# Patient Record
Sex: Female | Born: 1950 | Race: Black or African American | Hispanic: No | Marital: Single | Smoking: Never smoker
Health system: Southern US, Community
[De-identification: ages and names within clinical notes are randomized; demographics above are authoritative.]

## PROBLEM LIST (undated history)

## (undated) DIAGNOSIS — K5909 Other constipation: Secondary | ICD-10-CM

## (undated) DIAGNOSIS — I10 Essential (primary) hypertension: Secondary | ICD-10-CM

## (undated) DIAGNOSIS — I639 Cerebral infarction, unspecified: Secondary | ICD-10-CM

## (undated) HISTORY — PX: NO PAST SURGERIES: SHX2092

---

## 2009-11-29 ENCOUNTER — Inpatient Hospital Stay (HOSPITAL_COMMUNITY): Admission: EM | Admit: 2009-11-29 | Discharge: 2009-12-01 | Payer: Self-pay | Admitting: Emergency Medicine

## 2011-03-12 LAB — BASIC METABOLIC PANEL
BUN: 23 mg/dL (ref 6–23)
CO2: 24 mEq/L (ref 19–32)
Calcium: 8 mg/dL — ABNORMAL LOW (ref 8.4–10.5)
Calcium: 8.4 mg/dL (ref 8.4–10.5)
Calcium: 9.5 mg/dL (ref 8.4–10.5)
Calcium: 9.7 mg/dL (ref 8.4–10.5)
Chloride: 97 mEq/L (ref 96–112)
Creatinine, Ser: 0.75 mg/dL (ref 0.4–1.2)
Creatinine, Ser: 0.83 mg/dL (ref 0.4–1.2)
Creatinine, Ser: 1.75 mg/dL — ABNORMAL HIGH (ref 0.4–1.2)
GFR calc Af Amer: 49 mL/min — ABNORMAL LOW (ref >60)
GFR calc Af Amer: >60 mL/min (ref >60)
GFR calc Af Amer: >60 mL/min (ref >60)
GFR calc non Af Amer: 30 mL/min — ABNORMAL LOW (ref >60)
GFR calc non Af Amer: 38 mL/min — ABNORMAL LOW (ref >60)
GFR calc non Af Amer: 41 mL/min — ABNORMAL LOW (ref >60)
Glucose, Bld: 186 mg/dL — ABNORMAL HIGH (ref 70–99)
Glucose, Bld: 224 mg/dL — ABNORMAL HIGH (ref 70–99)
Potassium: 3.8 mEq/L (ref 3.5–5.1)
Potassium: 4.1 mEq/L (ref 3.5–5.1)
Sodium: 133 mEq/L — ABNORMAL LOW (ref 135–145)
Sodium: 133 mEq/L — ABNORMAL LOW (ref 135–145)

## 2011-03-12 LAB — GLUCOSE, CAPILLARY
Glucose-Capillary: 137 mg/dL — ABNORMAL HIGH (ref 70–99)
Glucose-Capillary: 168 mg/dL — ABNORMAL HIGH (ref 70–99)
Glucose-Capillary: 204 mg/dL — ABNORMAL HIGH (ref 70–99)
Glucose-Capillary: 293 mg/dL — ABNORMAL HIGH (ref 70–99)
Glucose-Capillary: >600 mg/dL (ref 70–99)
Glucose-Capillary: >600 mg/dL (ref 70–99)
Glucose-Capillary: >600 mg/dL (ref 70–99)

## 2011-03-12 LAB — BASIC METABOLIC PANEL WITH GFR
BUN: 23 mg/dL (ref 6–23)
CO2: 16 meq/L — ABNORMAL LOW (ref 19–32)
Calcium: 9.7 mg/dL (ref 8.4–10.5)
Chloride: 91 meq/L — ABNORMAL LOW (ref 96–112)
Creatinine, Ser: 1.64 mg/dL — ABNORMAL HIGH (ref 0.4–1.2)
GFR calc non Af Amer: 32 mL/min — ABNORMAL LOW
Glucose, Bld: 937 mg/dL (ref 70–99)
Potassium: 5.1 meq/L (ref 3.5–5.1)
Sodium: 129 meq/L — ABNORMAL LOW (ref 135–145)

## 2011-03-12 LAB — BLOOD GAS, VENOUS
Bicarbonate: 15.2 mEq/L — ABNORMAL LOW (ref 20.0–24.0)
Patient temperature: 98.6
TCO2: 13.8 mmol/L (ref 0–100)
pCO2, Ven: 30.8 mmHg — ABNORMAL LOW (ref 45.0–50.0)
pH, Ven: 7.315 — ABNORMAL HIGH (ref 7.250–7.300)

## 2011-03-12 LAB — POCT I-STAT, CHEM 8
BUN: 22 mg/dL (ref 6–23)
Calcium, Ion: 1.2 mmol/L (ref 1.12–1.32)
Chloride: 96 mEq/L (ref 96–112)
HCT: 47 % — ABNORMAL HIGH (ref 36.0–46.0)
Potassium: 4.7 mEq/L (ref 3.5–5.1)
Sodium: 128 mEq/L — ABNORMAL LOW (ref 135–145)

## 2011-03-12 LAB — URINE MICROSCOPIC-ADD ON

## 2011-03-12 LAB — POCT CARDIAC MARKERS
Myoglobin, poc: 299 ng/mL (ref 12–200)
Troponin i, poc: <0.05 ng/mL (ref 0.00–0.09)

## 2011-03-12 LAB — DIFFERENTIAL
Basophils Relative: 0 % (ref 0–1)
Eosinophils Absolute: 0 10*3/uL (ref 0.0–0.7)
Eosinophils Relative: 0 % (ref 0–5)
Lymphocytes Relative: 38 % (ref 12–46)
Monocytes Absolute: 1 10*3/uL (ref 0.1–1.0)
Neutro Abs: 10.8 10*3/uL — ABNORMAL HIGH (ref 1.7–7.7)

## 2011-03-12 LAB — CK TOTAL AND CKMB (NOT AT ARMC)
CK, MB: 1.2 ng/mL (ref 0.3–4.0)
Relative Index: 1 (ref 0.0–2.5)
Total CK: 124 U/L (ref 7–177)

## 2011-03-12 LAB — CBC
Hemoglobin: 13.2 g/dL (ref 12.0–15.0)
MCHC: 32.2 g/dL (ref 30.0–36.0)
MCV: 86.4 fL (ref 78.0–100.0)
RBC: 4.73 MIL/uL (ref 3.87–5.11)
WBC: 19.1 10*3/uL — ABNORMAL HIGH (ref 4.0–10.5)

## 2011-03-12 LAB — LIPID PANEL
HDL: 35 mg/dL — ABNORMAL LOW (ref >39)
Total CHOL/HDL Ratio: 3.8 RATIO
Triglycerides: 69 mg/dL (ref <150)
VLDL: 14 mg/dL (ref 0–40)

## 2011-03-12 LAB — RAPID URINE DRUG SCREEN, HOSP PERFORMED
Cocaine: NOT DETECTED
Tetrahydrocannabinol: NOT DETECTED

## 2011-03-12 LAB — HEPATIC FUNCTION PANEL
ALT: 31 U/L (ref 0–35)
Albumin: 4.1 g/dL (ref 3.5–5.2)
Alkaline Phosphatase: 121 U/L — ABNORMAL HIGH (ref 39–117)
Total Bilirubin: 1.1 mg/dL (ref 0.3–1.2)

## 2011-03-12 LAB — URINALYSIS, ROUTINE W REFLEX MICROSCOPIC
Bilirubin Urine: NEGATIVE
Nitrite: NEGATIVE
Specific Gravity, Urine: 1.028 (ref 1.005–1.030)
Urobilinogen, UA: 0.2 mg/dL (ref 0.0–1.0)
pH: 5.5 (ref 5.0–8.0)

## 2011-03-12 LAB — URINE CULTURE

## 2011-03-12 LAB — HEMOGLOBIN A1C
Hgb A1c MFr Bld: 12.3 % — ABNORMAL HIGH (ref 4.6–6.1)
Mean Plasma Glucose: 306 mg/dL

## 2011-03-12 LAB — PATHOLOGIST SMEAR REVIEW

## 2011-03-12 LAB — CARDIAC PANEL(CRET KIN+CKTOT+MB+TROPI)
CK, MB: 2.9 ng/mL (ref 0.3–4.0)
Relative Index: 1.1 (ref 0.0–2.5)
Total CK: 296 U/L — ABNORMAL HIGH (ref 7–177)

## 2011-03-12 LAB — TSH: TSH: 0.713 u[IU]/mL (ref 0.350–4.500)

## 2011-03-12 LAB — KETONES, QUALITATIVE

## 2011-03-12 LAB — APTT: aPTT: 23 seconds — ABNORMAL LOW (ref 24–37)

## 2011-03-12 LAB — SALICYLATE LEVEL: Salicylate Lvl: <4 mg/dL (ref 2.8–20.0)

## 2011-04-07 ENCOUNTER — Emergency Department (HOSPITAL_COMMUNITY): Payer: Medicare Other

## 2011-04-07 ENCOUNTER — Inpatient Hospital Stay (HOSPITAL_COMMUNITY)
Admission: EM | Admit: 2011-04-07 | Discharge: 2011-04-12 | DRG: 389 | Disposition: A | Discharge status: Home / Self Care | Payer: Medicare Other | Attending: Family Medicine | Admitting: Family Medicine

## 2011-04-07 DIAGNOSIS — K560 Paralytic ileus: Principal | ICD-10-CM | POA: Diagnosis present

## 2011-04-07 DIAGNOSIS — F72 Severe intellectual disabilities: Secondary | ICD-10-CM | POA: Diagnosis present

## 2011-04-07 DIAGNOSIS — I10 Essential (primary) hypertension: Secondary | ICD-10-CM | POA: Diagnosis present

## 2011-04-07 DIAGNOSIS — Z7982 Long term (current) use of aspirin: Secondary | ICD-10-CM

## 2011-04-07 DIAGNOSIS — Z8673 Personal history of transient ischemic attack (TIA), and cerebral infarction without residual deficits: Secondary | ICD-10-CM

## 2011-04-07 DIAGNOSIS — K5641 Fecal impaction: Secondary | ICD-10-CM | POA: Diagnosis present

## 2011-04-07 DIAGNOSIS — E876 Hypokalemia: Secondary | ICD-10-CM | POA: Diagnosis present

## 2011-04-07 DIAGNOSIS — E119 Type 2 diabetes mellitus without complications: Secondary | ICD-10-CM | POA: Diagnosis present

## 2011-04-07 DIAGNOSIS — K5939 Other megacolon: Secondary | ICD-10-CM | POA: Diagnosis present

## 2011-04-07 LAB — DIFFERENTIAL
Basophils Absolute: 0.1 K/uL (ref 0.0–0.1)
Basophils Relative: 0 % (ref 0–1)
Eosinophils Absolute: 0.1 K/uL (ref 0.0–0.7)
Eosinophils Relative: 0 % (ref 0–5)
Lymphocytes Relative: 23 % (ref 12–46)
Lymphs Abs: 2.9 K/uL (ref 0.7–4.0)
Monocytes Absolute: 0.8 10*3/uL (ref 0.1–1.0)
Monocytes Relative: 6 % (ref 3–12)
Neutro Abs: 8.6 10*3/uL — ABNORMAL HIGH (ref 1.7–7.7)
Neutrophils Relative %: 70 % (ref 43–77)

## 2011-04-07 LAB — URINALYSIS, ROUTINE W REFLEX MICROSCOPIC
Bilirubin Urine: NEGATIVE
Glucose, UA: NEGATIVE mg/dL
Hgb urine dipstick: NEGATIVE
Ketones, ur: 40 mg/dL — AB
Nitrite: NEGATIVE
Protein, ur: NEGATIVE mg/dL
Specific Gravity, Urine: 1.024 (ref 1.005–1.030)
Urobilinogen, UA: 0.2 mg/dL (ref 0.0–1.0)
pH: 6 (ref 5.0–8.0)

## 2011-04-07 LAB — COMPREHENSIVE METABOLIC PANEL
ALT: 28 U/L (ref 0–35)
AST: 26 U/L (ref 0–37)
Alkaline Phosphatase: 77 U/L (ref 39–117)
CO2: 27 mEq/L (ref 19–32)
Calcium: 9.8 mg/dL (ref 8.4–10.5)
Chloride: 100 mEq/L (ref 96–112)
GFR calc Af Amer: >60 mL/min (ref >60)
GFR calc non Af Amer: >60 mL/min (ref >60)
Potassium: 2.6 mEq/L — CL (ref 3.5–5.1)
Sodium: 139 mEq/L (ref 135–145)

## 2011-04-07 LAB — COMPREHENSIVE METABOLIC PANEL WITH GFR
Albumin: 4 g/dL (ref 3.5–5.2)
BUN: 11 mg/dL (ref 6–23)
Creatinine, Ser: 0.84 mg/dL (ref 0.4–1.2)
Glucose, Bld: 125 mg/dL — ABNORMAL HIGH (ref 70–99)
Total Bilirubin: 0.9 mg/dL (ref 0.3–1.2)
Total Protein: 8 g/dL (ref 6.0–8.3)

## 2011-04-07 LAB — CBC
HCT: 42.5 % (ref 36.0–46.0)
Hemoglobin: 14.1 g/dL (ref 12.0–15.0)
MCH: 27.6 pg (ref 26.0–34.0)
MCHC: 33.2 g/dL (ref 30.0–36.0)
MCV: 83.2 fL (ref 78.0–100.0)
Platelets: 415 K/uL — ABNORMAL HIGH (ref 150–400)
RBC: 5.11 MIL/uL (ref 3.87–5.11)
RDW: 14 % (ref 11.5–15.5)
WBC: 12.3 K/uL — ABNORMAL HIGH (ref 4.0–10.5)

## 2011-04-07 LAB — LIPASE, BLOOD: Lipase: 23 U/L (ref 11–59)

## 2011-04-07 LAB — URINE MICROSCOPIC-ADD ON

## 2011-04-07 MED ORDER — IOHEXOL 300 MG/ML  SOLN
125.0000 mL | Freq: Once | INTRAMUSCULAR | Status: AC | PRN
  Administered 2011-04-07: 125 mL via INTRAVENOUS

## 2011-04-08 LAB — BASIC METABOLIC PANEL
CO2: 26 mEq/L (ref 19–32)
Chloride: 102 mEq/L (ref 96–112)
Creatinine, Ser: 0.71 mg/dL (ref 0.4–1.2)
GFR calc Af Amer: >60 mL/min (ref >60)

## 2011-04-08 LAB — HEMOGLOBIN A1C: Hgb A1c MFr Bld: 6.7 % — ABNORMAL HIGH (ref <5.7)

## 2011-04-08 LAB — CBC
HCT: 37 % (ref 36.0–46.0)
MCV: 83.7 fL (ref 78.0–100.0)
RBC: 4.42 MIL/uL (ref 3.87–5.11)
RDW: 14.2 % (ref 11.5–15.5)
WBC: 10.6 10*3/uL — ABNORMAL HIGH (ref 4.0–10.5)

## 2011-04-08 LAB — GLUCOSE, CAPILLARY: Glucose-Capillary: 122 mg/dL — ABNORMAL HIGH (ref 70–99)

## 2011-04-09 DIAGNOSIS — R933 Abnormal findings on diagnostic imaging of other parts of digestive tract: Secondary | ICD-10-CM

## 2011-04-09 DIAGNOSIS — K5900 Constipation, unspecified: Secondary | ICD-10-CM

## 2011-04-09 DIAGNOSIS — K598 Other specified functional intestinal disorders: Secondary | ICD-10-CM

## 2011-04-09 LAB — COMPREHENSIVE METABOLIC PANEL
ALT: 24 U/L (ref 0–35)
Albumin: 3.2 g/dL — ABNORMAL LOW (ref 3.5–5.2)
Alkaline Phosphatase: 66 U/L (ref 39–117)
GFR calc Af Amer: >60 mL/min (ref >60)
Potassium: 3.3 mEq/L — ABNORMAL LOW (ref 3.5–5.1)
Sodium: 140 mEq/L (ref 135–145)
Total Protein: 6.5 g/dL (ref 6.0–8.3)

## 2011-04-09 LAB — GLUCOSE, CAPILLARY
Glucose-Capillary: 154 mg/dL — ABNORMAL HIGH (ref 70–99)
Glucose-Capillary: 163 mg/dL — ABNORMAL HIGH (ref 70–99)

## 2011-04-10 ENCOUNTER — Inpatient Hospital Stay (HOSPITAL_COMMUNITY): Payer: Medicare Other

## 2011-04-10 DIAGNOSIS — R933 Abnormal findings on diagnostic imaging of other parts of digestive tract: Secondary | ICD-10-CM

## 2011-04-10 DIAGNOSIS — K598 Other specified functional intestinal disorders: Secondary | ICD-10-CM

## 2011-04-10 LAB — BASIC METABOLIC PANEL
BUN: 3 mg/dL — ABNORMAL LOW (ref 6–23)
Calcium: 9 mg/dL (ref 8.4–10.5)
GFR calc non Af Amer: >60 mL/min (ref >60)
Glucose, Bld: 101 mg/dL — ABNORMAL HIGH (ref 70–99)
Potassium: 3.5 mEq/L (ref 3.5–5.1)

## 2011-04-10 LAB — GLUCOSE, CAPILLARY
Glucose-Capillary: 109 mg/dL — ABNORMAL HIGH (ref 70–99)
Glucose-Capillary: 138 mg/dL — ABNORMAL HIGH (ref 70–99)
Glucose-Capillary: 169 mg/dL — ABNORMAL HIGH (ref 70–99)
Glucose-Capillary: 95 mg/dL (ref 70–99)

## 2011-04-10 LAB — CBC
Hemoglobin: 13.1 g/dL (ref 12.0–15.0)
MCH: 27.4 pg (ref 26.0–34.0)
MCHC: 33.2 g/dL (ref 30.0–36.0)

## 2011-04-11 LAB — BASIC METABOLIC PANEL
CO2: 23 mEq/L (ref 19–32)
Chloride: 107 mEq/L (ref 96–112)
GFR calc Af Amer: >60 mL/min (ref >60)
Potassium: 4.3 mEq/L (ref 3.5–5.1)
Sodium: 137 mEq/L (ref 135–145)

## 2011-04-11 LAB — GLUCOSE, CAPILLARY
Glucose-Capillary: 137 mg/dL — ABNORMAL HIGH (ref 70–99)
Glucose-Capillary: 115 mg/dL — ABNORMAL HIGH (ref 70–99)
Glucose-Capillary: 99 mg/dL (ref 70–99)

## 2011-04-11 LAB — CBC
Hemoglobin: 12.1 g/dL (ref 12.0–15.0)
MCH: 26.7 pg (ref 26.0–34.0)
RBC: 4.54 MIL/uL (ref 3.87–5.11)

## 2011-04-12 LAB — BASIC METABOLIC PANEL
BUN: 3 mg/dL — ABNORMAL LOW (ref 6–23)
Creatinine, Ser: 0.51 mg/dL (ref 0.4–1.2)
GFR calc non Af Amer: >60 mL/min (ref >60)

## 2011-04-12 LAB — GLUCOSE, CAPILLARY: Glucose-Capillary: 105 mg/dL — ABNORMAL HIGH (ref 70–99)

## 2011-04-21 NOTE — H&P (Signed)
[name], [name]             ACCOUNT NO.:  [id number]  MEDICAL RECORD NO.:  [id number]           PATIENT TYPE:  I  LOCATION:  1445                         FACILITY:  [facility]  PHYSICIAN:  Della Goo, M.D. DATE OF BIRTH:  May 17, 1951  DATE OF ADMISSION:  04/07/2011 DATE OF DISCHARGE:                             HISTORY & PHYSICAL   PRIMARY CARE PHYSICIAN:  Dr. Knox Royalty.  CHIEF COMPLAINT:  Nausea and vomiting, constipation, and abdominal bloating.  HISTORY OF PRESENT ILLNESS:  This is a 60 year old female with a history of severe mental retardation with a capacity of a 14-year-old who was brought to the emergency department by her sister secondary to continued nausea and vomiting over the past 5 days as well as constipation for the past 5 days.  The patient has also had associated abdominal distention and abdominal pain.  The patient's sister denies that she has had any hematemesis.  She has not had any fever or chills.  The patient has not had any complaints of chest pain or shortness of breath.  The patient was evaluated in the emergency department and was found to have severe abdominal distention.  An NG tube was placed and imaging studies were performed, which did reveal a gaseous distention of the small and large bowel presenting an ileus, however, an obstruction was not excluded, no free air was seen.  A CT scan of the abdomen and pelvis was performed to clarify whether there was an obstruction and the results were consistent with a severe diffuse colonic ileus with a mass involving the posterior wall of the distal rectum above the anal verge, which could be a fecal impaction.  Normal caliber small bowel seen throughout without evidence of bowel obstruction.  The stomach was decompressed by the NG tube and diffuse hepatic steatosis was seen with focal areas of fatty sparing.  The patient's sister reports that [name] had a hospitalization for similar symptoms  in the past and a rectal tube had to be placed for decompression.  PAST MEDICAL HISTORY: 1. Hypertension. 2. Type 2 diabetes mellitus. 3. Mental retardation. 4. The patient also had a cerebrovascular accident as a baby at age 44.  PAST SURGICAL HISTORY:  None.  MEDICATIONS:  Lisinopril, Coreg, amlodipine, and Lantus insulin.  ALLERGIES:  No known drug allergies.  SOCIAL HISTORY:  The patient lives with her family.  She is a nonsmoker, nondrinker.  No history of illicit drug usage and family history is noncontributory.  REVIEW OF SYSTEMS:  Pertinent as mentioned above.  PHYSICAL EXAMINATION FINDINGS:  GENERAL:  This is a 60 year old, small, morbidly obese African American female who is in discomfort, but in no acute distress. VITAL SIGNS:  Temperature 99.0, blood pressure 156/90, heart rate 110, respirations 18, O2 saturations 96%.  HEENT:  Normocephalic, atraumatic. Pupils equally round, reactive to light.  Extraocular movements are intact.  Funduscopic benign.  Nares, there is a nasogastric tube present in the right naris, both nares are patent otherwise.  Oropharynx is clear. NECK:  Supple.  Full range of motion.  No thyromegaly, adenopathy, or jugular venous distention. CARDIOVASCULAR:  Regular rate and rhythm with mild  tachycardia.  No murmurs, gallops, or rubs appreciated.  LUNGS:  Clear to auscultation bilaterally.  No rales, rhonchi, or wheezes. ABDOMEN:  Hypoactive bowel sounds, firm, distended, tympanic in all 4 quadrants and mildly tender diffusely.  There is no rebound, no guarding. EXTREMITIES:  Without cyanosis, clubbing, or edema. NEUROLOGIC:  Nonfocal.  LABORATORY STUDIES:  White blood cell count 12.3, hemoglobin 14.1, hematocrit 42.5, MCV 83.2, platelets 115, neutrophils 70% lymphocytes 23%.  Urinalysis negative except for small leukocyte esterase.  Sodium 139, potassium 2.6, chloride 100, CO2 of 27, BUN 11, creatinine 0.84, glucose 125, lipase  23.  IMAGING STUDIES:  As mentioned above in HPI.  ASSESSMENT:  A 60 year old female being admitted with, 1. Colonic ileus. 2. Abdominal distention. 3. Abdominal pain. 4. Nausea and vomiting. 5. Hypokalemia. 6. Hypertension. 7. Type 2 diabetes mellitus. 8. Mental retardation. 9. Morbid obesity.  PLAN:  The patient will be admitted, an NG tube had been placed by the emergency department physician.  This has minimal draining residue that is nonsanguineous.  A rectal tube has been placed for decompression and this has helped.  A large amount of colonic air has been relieved.  The patient will be placed on clear liquids at this time and antiemetic therapy will also be ordered as needed along with laxative therapy.  IV Reglan will be ordered to promote transit.  Potassium replacement therapy will also be ordered IV and a magnesium level will also be ordered and repleted if needed.  The patient will be placed on DVT prophylaxis and IV Protonix therapy will also be ordered.  Code status is a full code at this time.     Della Goo, M.D.     HJ/MEDQ  D:  04/08/2011  T:  04/08/2011  Job:  [id number]  cc:   Dr. Knox Royalty  Electronically Signed by Della Goo M.D. on 04/21/2011 07:48:37 PM

## 2011-04-23 NOTE — Discharge Summary (Signed)
[name], [name]             ACCOUNT NO.:  [id number]  MEDICAL RECORD NO.:  [id number]           PATIENT TYPE:  I  LOCATION:  1445                         [facility]ACILITY:  [facility]  PHYSICIAN:  [name], M.D.DATE OF BIRTH:  [birth year]  DATE OF ADMISSION:  04/07/2011 DATE OF DISCHARGE:  04/12/2011                        DISCHARGE SUMMARY - REFERRING   DISCHARGE DIAGNOSES: 1. Severe diffuse chronic ileus/chronic obstipation. 2. Hypokalemia - resolved. 3. Diabetes mellitus. 4. Hypertension. 5. Mental retardation. 6. History of cerebrovascular accident at age 16.  PROCEDURES AND STUDIES: 1. Abdominal x-rays on April 28 - gaseous distention of small and     large bowel in the pattern most likely to represent ileus.  Distal     colonic obstruction is not excluded.  No free air. 2. CT scan of the abdomen and pelvis on April 28 - findings consistent     with severe diffuse colonic ileus, though there may be a mass     involving the posterior wall of the distal rectum just above the     anal verge.  Please correlate the digital rectal exam.  Normal     caliber small bowel throughout without evidence of bowel     obstruction.  Stomach decompressed by nasogastric tube.  Diffuse     hepatic steatosis with focal areas of fatty sparing. 3. Followup abdominal x-ray on May 1 - no significant change in the     diffuse colonic dilatation to the level of the sigmoid. 4. Colonoscopy to the descending colon - on April 09, 2011, by Dr.     Lina [name].  The area of the colon examined was normal in     appearance, dilated, tortuous, hypotonic, full of liquid stool.  No     obstructing lesion, unable to reach the cecum.  Mid ascending colon     by transillumination, the rectum and sigmoid appeared normal.     Retroflexed views in the rectum revealed no abnormalities.  The     scope was then withdrawn from the patient and the procedure     terminated.  CONSULTATIONS:  Gastroenterology - Dr.  Lina [name]  BRIEF HISTORY:  The patient is a 60 year old black female with the above- listed medical problems including severe mental retardation with the capacity of a 65-year-old who was brought to the ED by her sister secondary to nausea and vomiting of 5 days as well as constipation for the same duration.  The patient also had associated abdominal distention and pain.  Her sister denied any hematemesis.  She did not report any fevers or chills and the patient has not had chest pain or shortness of breath.  She was seen in the ED and was found to have severe abdominal distention and an NG tube was placed.  Imaging studies were done and the results as stated above.  She was admitted for further evaluation and management.  HOSPITAL COURSE: 1. Severe colonic ileus/chronic obstipation with fecal impaction - as     discussed above, the patient had imaging studies on admission and     the results as stated above, an NG tube was placed  in the ED     department and the admitting physician noted that there was only     minimal draining residual that was nonbloody.  The rectal tube was     then placed for decompression and this helped.  A large amount of     colonic air was removed.  The patient began improving and was     placed on a clear liquid diet and was also treated symptomatically     with antiemetics.  She was also placed on the bowel regimen with     MiraLax and Dulcolax and Gastroenterology was consulted.  Dr. Lina [name] saw the patient and it was noted that the CT scan indicated     that a rectal mass could not be excluded, and so a colonoscopy was     done on April 09, 2011, to the ascending colon and showed a normal     colon, normal rectosigmoid, hypotonic mildly dilated tortuous colon     consistent with colonic inertia, no anatomic obstruction, fecal     pouch not reached.  The rectal tube was removed and the patient was     maintained on the bowel regimen.  She has had  about 2 to 3 large     bowel movements a day on this regimen and she is not having any     abdominal pain and her abdomen is much less distended.  She is     tolerating p.o.'s well with no nausea or vomiting.  She is     clinically improved at this time and I discussed her care with Dr.     Juanda Chance today and she recommends discharging her on MiraLax b.i.d.     with Dulcolax daily as well sometime at mid day.  She is to follow     up outpatient with her primary care physician as well as with Dr.     Juanda Chance. 2. Diabetes mellitus - the patient was maintained on sliding scale     insulin during her hospital stay.  She has been discharged on a     lower dose of Lantus as the p.o. intake is better, but not back to     her baseline at this time.  She is to follow up with her PCP for     further monitoring of her blood sugars and adjustment of her Lantus     dose as clinically appropriate for optimal blood glucose control. 3. Hypertension - she is to continue her outpatient medications upon     discharge.  She has been discharged on supplemental potassium as     she is on hydrochlorothiazide and she is to follow up with her     primary care physician. 4. Hypokalemia - her potassium was replaced during this hospital stay     and as mentioned above since she is chronically on HCTZ that is     part of her lisinopril/HCT, she has been discharged on p.o.     potassium and is to follow up with her primary care physician for     further monitoring and management as appropriate. 5. Mental retardation - the patient has been discharged home with her     sister who takes care of her. 6. History of CVA - the patient was maintained on her aspirin during     this hospital stay.  DISCHARGE MEDICATIONS: 1. Dulcolax 10 mg by mouth q. 1 p.m., hold if diarrhea.  2. KCl 20 mEq p.o. daily. 3. MiraLax 17 g p.o. b.i.d., hold if diarrhea. 4. Lantus 10 units subcu q.a.m. 5. Norvasc 10 mg p.o. daily. 6. Aspirin 81 mg  p.o. daily p.r.n. 7. Coreg 3.125 mg daily as previously. 8. Lisinopril/hydrochlorothiazide 20/12.5 mg p.o. daily.  The sister has also been instructed as recommended by Dr. Juanda Chance that if her bowels did not move, then she can get a bottle of mag citrate once weekly as needed.  FOLLOWUP CARE: 1. Dr. Knox Royalty in 1-2 weeks.  The patient is to call for an     appointment. 2. Dr. Lina [name] in 1 week, the patient is to call for appointment.  DISCHARGE CONDITION:  Improved/stable.     [name], M.D.     ACV/MEDQ  D:  04/12/2011  T:  04/12/2011  Job:  [id number]  cc:   Hedwig Morton. Juanda Chance, MD [address] [place] [address]  Dr. Knox Royalty  Electronically Signed by Donnalee Curry M.D. on 04/23/2011 11:34:51 AM

## 2011-05-17 ENCOUNTER — Emergency Department (HOSPITAL_COMMUNITY): Payer: Medicare Other

## 2011-05-17 ENCOUNTER — Inpatient Hospital Stay (HOSPITAL_COMMUNITY)
Admission: EM | Admit: 2011-05-17 | Discharge: 2011-05-20 | DRG: 392 | Disposition: A | Discharge status: Home / Self Care | Payer: Medicare Other | Attending: Internal Medicine | Admitting: Internal Medicine

## 2011-05-17 DIAGNOSIS — E119 Type 2 diabetes mellitus without complications: Secondary | ICD-10-CM | POA: Diagnosis present

## 2011-05-17 DIAGNOSIS — E871 Hypo-osmolality and hyponatremia: Secondary | ICD-10-CM | POA: Diagnosis present

## 2011-05-17 DIAGNOSIS — T502X5A Adverse effect of carbonic-anhydrase inhibitors, benzothiadiazides and other diuretics, initial encounter: Secondary | ICD-10-CM | POA: Diagnosis present

## 2011-05-17 DIAGNOSIS — I10 Essential (primary) hypertension: Secondary | ICD-10-CM | POA: Diagnosis present

## 2011-05-17 DIAGNOSIS — D72829 Elevated white blood cell count, unspecified: Secondary | ICD-10-CM | POA: Diagnosis present

## 2011-05-17 DIAGNOSIS — E876 Hypokalemia: Secondary | ICD-10-CM | POA: Diagnosis present

## 2011-05-17 DIAGNOSIS — F72 Severe intellectual disabilities: Secondary | ICD-10-CM | POA: Diagnosis present

## 2011-05-17 DIAGNOSIS — Z8673 Personal history of transient ischemic attack (TIA), and cerebral infarction without residual deficits: Secondary | ICD-10-CM

## 2011-05-17 DIAGNOSIS — K5989 Other specified functional intestinal disorders: Principal | ICD-10-CM | POA: Diagnosis present

## 2011-05-17 LAB — COMPREHENSIVE METABOLIC PANEL
Albumin: 4.2 g/dL (ref 3.5–5.2)
Alkaline Phosphatase: 94 U/L (ref 39–117)
BUN: 7 mg/dL (ref 6–23)
Creatinine, Ser: 0.66 mg/dL (ref 0.4–1.2)
GFR calc Af Amer: >60 mL/min (ref >60)
Potassium: 3.2 mEq/L — ABNORMAL LOW (ref 3.5–5.1)
Total Protein: 8.2 g/dL (ref 6.0–8.3)

## 2011-05-17 LAB — CBC
MCV: 83.8 fL (ref 78.0–100.0)
Platelets: 386 10*3/uL (ref 150–400)
RDW: 14.9 % (ref 11.5–15.5)
WBC: 14.6 10*3/uL — ABNORMAL HIGH (ref 4.0–10.5)

## 2011-05-17 LAB — URINALYSIS, ROUTINE W REFLEX MICROSCOPIC
Glucose, UA: NEGATIVE mg/dL
Hgb urine dipstick: NEGATIVE
Specific Gravity, Urine: 1.015 (ref 1.005–1.030)
Urobilinogen, UA: 0.2 mg/dL (ref 0.0–1.0)

## 2011-05-17 LAB — DIFFERENTIAL
Basophils Relative: 0 % (ref 0–1)
Eosinophils Absolute: 0.1 10*3/uL (ref 0.0–0.7)
Eosinophils Relative: 1 % (ref 0–5)
Lymphs Abs: 4.7 10*3/uL — ABNORMAL HIGH (ref 0.7–4.0)

## 2011-05-17 LAB — MAGNESIUM: Magnesium: 2.3 mg/dL (ref 1.5–2.5)

## 2011-05-17 LAB — GLUCOSE, CAPILLARY: Glucose-Capillary: 116 mg/dL — ABNORMAL HIGH (ref 70–99)

## 2011-05-17 LAB — URINE MICROSCOPIC-ADD ON

## 2011-05-17 LAB — OCCULT BLOOD, POC DEVICE: Fecal Occult Bld: NEGATIVE

## 2011-05-18 ENCOUNTER — Inpatient Hospital Stay (HOSPITAL_COMMUNITY): Payer: Medicare Other

## 2011-05-18 DIAGNOSIS — R933 Abnormal findings on diagnostic imaging of other parts of digestive tract: Secondary | ICD-10-CM

## 2011-05-18 DIAGNOSIS — K560 Paralytic ileus: Secondary | ICD-10-CM

## 2011-05-18 DIAGNOSIS — E876 Hypokalemia: Secondary | ICD-10-CM

## 2011-05-18 LAB — CBC
HCT: 38.4 % (ref 36.0–46.0)
Hemoglobin: 12.1 g/dL (ref 12.0–15.0)
MCH: 26.5 pg (ref 26.0–34.0)
MCHC: 31.5 g/dL (ref 30.0–36.0)
MCV: 84 fL (ref 78.0–100.0)
Platelets: 320 10*3/uL (ref 150–400)
RDW: 14.9 % (ref 11.5–15.5)
WBC: 11.3 10*3/uL — ABNORMAL HIGH (ref 4.0–10.5)

## 2011-05-18 LAB — COMPREHENSIVE METABOLIC PANEL
ALT: 15 U/L (ref 0–35)
CO2: 28 mEq/L (ref 19–32)
Calcium: 9.1 mg/dL (ref 8.4–10.5)
Creatinine, Ser: 0.51 mg/dL (ref 0.4–1.2)
GFR calc non Af Amer: >60 mL/min (ref >60)
Glucose, Bld: 188 mg/dL — ABNORMAL HIGH (ref 70–99)

## 2011-05-18 LAB — DIFFERENTIAL
Eosinophils Absolute: 0.1 10*3/uL (ref 0.0–0.7)
Lymphocytes Relative: 31 % (ref 12–46)
Monocytes Absolute: 0.8 10*3/uL (ref 0.1–1.0)
Monocytes Relative: 7 % (ref 3–12)
Neutro Abs: 6.8 10*3/uL (ref 1.7–7.7)

## 2011-05-18 LAB — GLUCOSE, CAPILLARY
Glucose-Capillary: 110 mg/dL — ABNORMAL HIGH (ref 70–99)
Glucose-Capillary: 158 mg/dL — ABNORMAL HIGH (ref 70–99)

## 2011-05-18 LAB — PHOSPHORUS: Phosphorus: 2.7 mg/dL (ref 2.3–4.6)

## 2011-05-18 LAB — TSH: TSH: 0.952 u[IU]/mL (ref 0.350–4.500)

## 2011-05-18 LAB — HEMOGLOBIN A1C: Mean Plasma Glucose: 137 mg/dL — ABNORMAL HIGH (ref <117)

## 2011-05-19 LAB — GLUCOSE, CAPILLARY
Glucose-Capillary: 134 mg/dL — ABNORMAL HIGH (ref 70–99)
Glucose-Capillary: 177 mg/dL — ABNORMAL HIGH (ref 70–99)
Glucose-Capillary: 149 mg/dL — ABNORMAL HIGH (ref 70–99)
Glucose-Capillary: 109 mg/dL — ABNORMAL HIGH (ref 70–99)

## 2011-05-19 LAB — BASIC METABOLIC PANEL
BUN: 4 mg/dL — ABNORMAL LOW (ref 6–23)
Chloride: 98 mEq/L (ref 96–112)
Potassium: 4.4 mEq/L (ref 3.5–5.1)

## 2011-05-20 LAB — GLUCOSE, CAPILLARY: Glucose-Capillary: 99 mg/dL (ref 70–99)

## 2011-05-31 NOTE — Discharge Summary (Signed)
[name], [name]             ACCOUNT NO.:  [id number]  MEDICAL RECORD NO.:  [id number]  LOCATION:  1332                         FACILITY:  [facility]  PHYSICIAN:  Marinda Elk, M.D.DATE OF BIRTH:  1951-02-16  DATE OF ADMISSION:  05/17/2011 DATE OF DISCHARGE:  05/20/2011                              DISCHARGE SUMMARY   PRIMARY CARE DOCTOR:  Knox Royalty, MD  CONSULTANT:  Anselm Pancoast. Zachery Dakins, MD  DISCHARGE DIAGNOSES: 1. Colonic inertia. 2. Hypokalemia. 3. Hyponatremia. 4. Hypertension. 5. Diabetes type 2. 6. Leukocytosis.  DISCHARGE MEDICATIONS: 1. Lisinopril 40 mg p.o. daily. 2. Amlodipine 10 mg daily. 3. Aspirin 81 mg daily. 4. Bisacodyl 5 mg 1 tablet daily. 5. Coreg 3.125 mg every morning. 6. Lantus 10 units daily. 7. MiraLax 17 grams daily. 8. Multivitamins daily.  PROCEDURES PERFORMED: 1. Abdominal x-ray, more reduction in gaseous distention of the colon     with rectal tube present. 2. May 17, 2011, dilation of the colon and rectum similar to prior     study.  BRIEF ADMITTING HISTORY AND PHYSICAL:  This is a 60 year old female with past medical history recently discharged from the hospital on May 3 for diffuse colonic ileus.  At that time it was felt to be secondary to chronic __________.  Today she comes in but the patient cannot not give a history as she is sedated secondary to narcotic.  So the history was given by the family members who report that she began having abdominal swelling and complaining of abdominal pain and persistent nausea which progressively got worse.  They were unable to give an enema at home. She started belching with no bowel movements for about a week.  So the family decided to bring her into the hospital for further evaluation.  Labs on admission shows lipase 28, sodium 138, potassium 3.2, chloride 97, bicarb 25, glucose 120, BUN of 7, creatinine 0.6.  LFTs within normal limits.  White count of 4.6, hemoglobin of 13, ANC  9.0, MCV of 83, platelet count 386,000.  Guaiac is negative.  UA showed 3-6 white blood cells.  Urine pregnancy test is negative.  Abdominal x-ray as above.  BRIEF HOSPITAL COURSE: 1. Colonic inertia.  She was put n.p.o.  Rectal tube was put in place.     Repeated x-rays show significant improvement.  The tube was     discontinued.  Her electrolytes were corrected.  She started     passing gas and tolerating her diet.  Surgery was consulted.  They     recommended a colostomy if this continues to happen again.  It was     discussed with the family.  They would like to talk it over.  She     was discharged in stable condition. 2. Hypokalemia secondary to hydrochlorothiazide.  Mag was checked, was     repleted.  Potassium was repleted.  Her hydrochlorothiazide was     stopped as this could be contributing to her hyperkalemia, could be     contributing to her inertia.  She will follow up with her primary     care doctor. 3. Hyponatremia probably secondary to dehydration and vomiting.  This     resolved  with IV fluids. 4. Hypertension well controlled.  Her hydrochlorothiazide was stopped.     Her lisinopril was increased.  She will follow up with her primary     care physician. 5. Diabetes type 2, currently well-controlled.  No changes were made. 6. Leukocytosis probably secondary to colonic inertia.  This came down     without any further treatment.  VITALS ON DAY OF DISCHARGE:  Temperature 97, pulse 79, respiration of 18, blood pressure 144/71.  She was satting 100% on room air.  LABS ON DAY OF DISCHARGE:  None.  DISPOSITION:  The patient will follow up with her primary care doctor in 2 weeks.  Here we will see how she is doing from her standpoint.  We will get a BMET to see how her potassium is doing.  She was taking hydrochlorothiazide and will follow up on her blood pressure and titrate blood pressure medications as needed.     Marinda Elk, M.D.     AF/MEDQ  D:   05/20/2011  T:  05/20/2011  Job:  [id number]  Electronically Signed by Marinda Elk M.D. on 05/31/2011 06:29:40 PM

## 2011-05-31 NOTE — H&P (Signed)
[name], [name]             ACCOUNT NO.:  [id number]  MEDICAL RECORD NO.:  [id number]  LOCATION:  WLED                         FACILITY:  [facility]  PHYSICIAN:  Marinda Elk, M.D.DATE OF BIRTH:  10-14-1951  DATE OF ADMISSION:  05/17/2011 DATE OF DISCHARGE:                             HISTORY & PHYSICAL   PRIMARY CARE DOCTOR:  Knox Royalty, MD  CHIEF COMPLAINT:  Abdominal pain.  HISTORY OF PRESENT ILLNESS:  This is a 60 year old female with past medical history of recently discharged from the hospital on Apr 12, 2011 for severe diffuse colonic ileus.  At that time, it was thought to be secondary to chronic ileus.  Today, she comes in but the patient cannot give a history as she is sedated secondary to the narcotics, so the history was given by the family who reports that she began having abdominal swelling and complaining of pain and nausea persisted which was progressively getting worse.  They were unable to give an enema at home because it was stopped.  She started opened belching.  She did not have a bowel movement.  The constipated started about a week ago with no bowel movements at least in last 5 days as per family members.  They cannot relate if these were in large amounts since the last time she had them but they noted that she has not been able to eat anything for the last 2-3 days.  She has been complaining of abdominal pain, so they decided to bring her here to the ED for further evaluation..  In the ED, an x-ray was done that showed severe colonic dilation, unchanged from previous, so we were asked to admit and further evaluate.  MEDICATIONS: 1. Bisacodyl 5 mg p.o. daily. 2. KCl 20 mEq p.o. daily. 3. MiraLax 17 grams daily. 4. Multivitamin 1 tablet daily. 5. Lisinopril 20 mg daily. 6. Hydrochlorothiazide 12.5 mg daily. 7. Lantus 10 units daily. 8. Coreg 3.125 mg every morning. 9. Aspirin 81 mg daily. 10.Amlodipine 10 mg daily.  PAST MEDICAL  HISTORY: 1. Hypertension. 2. Diabetes type 2. 3. Mental retardation. 4. History of CVA 3 years ago. 5. History of severe colonic ileus.  SOCIAL HISTORY:  The patient lives with her family.  She is a nonsmoker and nondrinker.  No drugs or alcohol.  FAMILY HISTORY:  Noncontributory.  REVIEW OF SYSTEMS:  Ten-point review of systems done, pertinent positives per HPI.  PHYSICAL EXAMINATION:  VITAL SIGNS:  Temperature 98, pulse of 109, blood pressure 161/114, and she was saturating 99% on room air breathing 20 times per minute. GENERAL:  She is awake, alert, and oriented x3 in no acute distress. HEENT:  Normocephalic and atraumatic.  Pupils equally round and reactive to light and accommodation.  Anicteric.  No jaundice. NECK:  No JVD.  No bruits. CARDIOVASCULAR:  She is tachycardic with a regular rate.  No appreciated heart murmurs or rubs. LUNGS:  Good air movement and clear to auscultation. ABDOMEN:  She has a very distended belly, tympanic, and painful but no rebound or guarding. EXTREMITIES:  Positive pulses.  No clubbing, cyanosis, or edema. Lymphadenopathy nonpalpable. SKIN:  No rashes or ulcerations. NEUROLOGIC:  Nonfocal.  The patient cannot give  a history as she is sedated secondary to the narcotics.  She is only alert and oriented x1.  ADMISSION LABORATORY DATA:  Lipase of 28.  Her sodium is 134, potassium 3.2, chloride 97, bicarb of 25, glucose 120, BUN 7, and creatinine 0.6. LFTs within normal limits.  Her white count is 14.6 with a hemoglobin of 13 with an ANC of 9.0 with an MCV of 83, RDW of 14, and platelet count of 386.  Her guaiac was negative.  Her UA showed 3-6 white blood cells, 10-15 specific gravity.  Her urine pregnancy test is negative.  An abdominal x-ray showed diffuse dilation of the colon to the rectum, similar to prior exam, most likely representing colonic ileus although anorectal obstruction cannot be excluded.  ASSESSMENT AND PLAN: 1. Severe  colonic ileus.  I have asked the nurse to put an NG tube.     Her previous CT scan did not show a rectal mass and she had a     colonoscopy which did not show the obstruction.  I have called     Gastroenterology in case the nurse cannot put a rectal tube.  Also,     the NG tube was placed by the ED.  It has only put out minimal     amounts.  We will check a B12 level and also check a TSH.  We will     replete her electrolytes and we will try to get her on the regimen.     At this time, we will await for Gastroenterology for further     recommendations if there are any.  As this has happened before, the     patient needs to be on a regular regimen to try to prevent this and     try to keep her electrolytes in place, so check magnesium and     replete them as needed. 2. Hypokalemia.  We will replete potassium and we will check magnesium     and replete as needed.  We will also check a phosphorus. 3. Hyponatremia.  This is probably secondary to dehydration.  Check a     urine sodium and urinary creatinine.  We will give IV fluids. 4. Hypertension.  We are going to hold all her blood pressure     medication at this time.  We will use labetalol on     p.r.n. basis and we will monitor closely. 5. Diabetes type 2.  We will hold on her insulin and her Lantus and we     will use sliding sensitive scale.  We will get CBGs q.4 h.     Marinda Elk, M.D.     AF/MEDQ  D:  05/17/2011  T:  05/17/2011  Job:  [id number]  cc:   Knox Royalty, MD Fax: [phone]  Electronically Signed by Marinda Elk M.D. on 05/31/2011 59:56:38 PM

## 2011-06-04 NOTE — Consult Note (Addendum)
[name], [name]             ACCOUNT NO.:  [id number]  MEDICAL RECORD NO.:  [id number]  LOCATION:  1332                         FACILITY:  [facility]  PHYSICIAN:  Anselm Pancoast. Zachery Dakins, M.D.DATE OF BIRTH:  08-Aug-1951  DATE OF CONSULTATION:  05/18/2011 DATE OF DISCHARGE:                                CONSULTATION   REQUESTING PHYSICIAN:  Marinda Elk, MD  PRIMARY CARE PHYSICIAN:  Knox Royalty, MD  REASON FOR CONSULTATION:  Recurrent colonic ileus.  HISTORY OF PRESENT ILLNESS:  Ms. Dungee is a pleasant 60 year old African American female with an underlying history of mental retardation who actually comes in for the second time in a month with severe bowel distention and chronic obstipation.  She was diagnosed with a colonic pseudo-obstruction previously and sent home on a bowel regimen. However, it has been difficult for them to adhere to due to the mental handicap.  The obstipation started approximately a week ago without any significant bowel function over the past 5 days.  She has had poor appetite for the last couple days as well and has been complaining of increased pain prior to this admission.  Her workup previously included colonoscopy and CT scan, which did not find any evidence of mass or genuine obstruction.  She has been on a MiraLax regimen at home as well as Dulcolax.  She does not take any chronic narcotics.  Since being admitted here, she has again been consulted by the gastroenterology team who have been able to place a rectal tube with good results, significant decompression of air and liquid stool.  Since that time she appears to have felt significantly better though she is quite fidgety and trying to pull at the tube.  Surgical consult has been requested as there is obvious concern that this will be a chronic and possibly unresolving problem.  Therefore, we are asked to see the patient and discuss surgical options if necessary.  PAST MEDICAL  HISTORY:  Significant for hypertension, diabetes mellitus, mental retardation, history of stroke, history of chronic colonic pseudo- obstruction.  SURGICAL HISTORY:  Negative.  SOCIAL HISTORY:  The patient lives with her family.  No alcohol, tobacco or illicit drug use.  FAMILY HISTORY:  Noncontributory to the present case.  MEDICATIONS:  Include Dulcolax, potassium supplements, MiraLax, multivitamin, lisinopril, hydrochlorothiazide, Lantus, Coreg, aspirin and amlodipine.  ALLERGIES:  No known drug or latex allergies.  REVIEW OF SYSTEMS:  Please see history of present illness for pertinent findings.  Complete 10-system review found negative.  PHYSICAL EXAMINATION:  GENERAL:  Physical exam reveals a 60 year old African American female in no distress. VITAL SIGNS:  Temperature of 97.7, heart rate of 79, blood pressure 140/84, respiratory rate of 16, oxygen saturation 99-100% on room air. LUNGS:  Clear to auscultation.  No wheezes, rhonchi, or rales.  Normal respiratory effort without use of accessory muscles. HEART:  Regular rate and rhythm.  No murmurs, gallops, or rubs. Carotids 2+ and brisk without bruits.  Peripheral pulses intact and symmetrical. ABDOMEN:  The abdomen is soft, mildly distended, hypoactive bowel sounds are appreciated.  No surgical scars.  No mass effect or hernias are appreciated. RECTAL:  Rectal exam is deferred due to the presence of  rectal tube.  DIAGNOSTICS:  CBC today shows a white blood cell count of 11.3, decreased from her admission white count of 14.6, hemoglobin of 12.1, hematocrit of 38.4, platelet count of 320.  Metabolic panel shows sodium of 132, potassium of 4.5, chloride of 96, CO2 of 28, BUN of 4, creatinine of 0.5, glucose of 188.  Liver enzymes within normal limits.  IMAGING:  The abdominal films show significant gastric distention upon admission, however, repeat films today show marked reduction in the degree of distention and  placement of rectal tube in good position.  IMPRESSION: 1. Chronic recurrent colonic pseudo-obstruction and dysmotility. 2. Mental retardation. 3. Diabetes mellitus.  RECOMMENDATIONS:  Agree with current plan as set forth by Gastroenterology with rectal tube and MiraLax regimen.  Discussed with the family that there is no immediate role for surgical intervention at present time, but if conservative treatment, i.e., bowel regimen is not successful in long term, then surgery could be considered.  Surgical options including a diverting ileostomy or subtotal colectomy with anastomosis versus ostomy would be considered.  The family was advised that these types of procedures including ostomies can be very difficult to deal with in patients with mental disabilities.  Therefore, at present time we again recommend conservative management, which the family is in agreement with.     Brayton El, PA-C   ______________________________ Anselm Pancoast. Zachery Dakins, M.D.    KB/MEDQ  D:  05/18/2011  T:  05/18/2011  Job:  [id number]  cc:   Marinda Elk, M.D.  Electronically Signed by Brayton El  on 06/04/2011 02:35:46 PM Electronically Signed by Consuello Bossier M.D. on 06/19/2011 03:46:46 PM

## 2011-07-24 ENCOUNTER — Inpatient Hospital Stay (HOSPITAL_COMMUNITY)
Admission: EM | Admit: 2011-07-24 | Discharge: 2011-07-30 | DRG: 390 | Disposition: A | Discharge status: Home / Self Care | Payer: Medicare Other | Attending: Internal Medicine | Admitting: Internal Medicine

## 2011-07-24 ENCOUNTER — Emergency Department (HOSPITAL_COMMUNITY): Payer: Medicare Other

## 2011-07-24 DIAGNOSIS — E876 Hypokalemia: Secondary | ICD-10-CM | POA: Diagnosis present

## 2011-07-24 DIAGNOSIS — Z794 Long term (current) use of insulin: Secondary | ICD-10-CM

## 2011-07-24 DIAGNOSIS — Z79899 Other long term (current) drug therapy: Secondary | ICD-10-CM

## 2011-07-24 DIAGNOSIS — F79 Unspecified intellectual disabilities: Secondary | ICD-10-CM | POA: Diagnosis present

## 2011-07-24 DIAGNOSIS — I10 Essential (primary) hypertension: Secondary | ICD-10-CM | POA: Diagnosis present

## 2011-07-24 DIAGNOSIS — K5900 Constipation, unspecified: Secondary | ICD-10-CM | POA: Diagnosis present

## 2011-07-24 DIAGNOSIS — K7689 Other specified diseases of liver: Secondary | ICD-10-CM | POA: Diagnosis present

## 2011-07-24 DIAGNOSIS — E119 Type 2 diabetes mellitus without complications: Secondary | ICD-10-CM | POA: Diagnosis present

## 2011-07-24 DIAGNOSIS — Z8673 Personal history of transient ischemic attack (TIA), and cerebral infarction without residual deficits: Secondary | ICD-10-CM

## 2011-07-24 DIAGNOSIS — L539 Erythematous condition, unspecified: Secondary | ICD-10-CM | POA: Diagnosis not present

## 2011-07-24 DIAGNOSIS — K560 Paralytic ileus: Principal | ICD-10-CM | POA: Diagnosis present

## 2011-07-24 DIAGNOSIS — K598 Other specified functional intestinal disorders: Secondary | ICD-10-CM

## 2011-07-24 LAB — BASIC METABOLIC PANEL
BUN: 8 mg/dL (ref 6–23)
CO2: 30 mEq/L (ref 19–32)
Chloride: 102 mEq/L (ref 96–112)
GFR calc non Af Amer: >60 mL/min (ref >60)
Glucose, Bld: 146 mg/dL — ABNORMAL HIGH (ref 70–99)
Potassium: 3.4 mEq/L — ABNORMAL LOW (ref 3.5–5.1)
Sodium: 140 mEq/L (ref 135–145)

## 2011-07-24 LAB — GLUCOSE, CAPILLARY: Glucose-Capillary: 97 mg/dL (ref 70–99)

## 2011-07-24 LAB — CBC
HCT: 37.8 % (ref 36.0–46.0)
Hemoglobin: 12.3 g/dL (ref 12.0–15.0)
MCHC: 32.5 g/dL (ref 30.0–36.0)
RBC: 4.35 MIL/uL (ref 3.87–5.11)
WBC: 10.5 10*3/uL (ref 4.0–10.5)

## 2011-07-24 LAB — DIFFERENTIAL
Basophils Absolute: 0 10*3/uL (ref 0.0–0.1)
Basophils Relative: 0 % (ref 0–1)
Lymphocytes Relative: 21 % (ref 12–46)
Monocytes Absolute: 0.6 10*3/uL (ref 0.1–1.0)
Neutro Abs: 7.7 10*3/uL (ref 1.7–7.7)
Neutrophils Relative %: 73 % (ref 43–77)

## 2011-07-24 LAB — URINE MICROSCOPIC-ADD ON

## 2011-07-24 LAB — URINALYSIS, ROUTINE W REFLEX MICROSCOPIC
Bilirubin Urine: NEGATIVE
Glucose, UA: NEGATIVE mg/dL
Hgb urine dipstick: NEGATIVE
Specific Gravity, Urine: 1.02 (ref 1.005–1.030)
Urobilinogen, UA: 1 mg/dL (ref 0.0–1.0)

## 2011-07-24 NOTE — H&P (Unsigned)
[name], ZELL NO.:  [id number]  MEDICAL RECORD NO.:  [id number]  LOCATION:  1343                         FACILITY:  [facility]  PHYSICIAN:  Calvert Cantor, M.D.     DATE OF BIRTH:  Apr 22, 1951  DATE OF ADMISSION:  07/24/2011 DATE OF DISCHARGE:                             HISTORY & PHYSICAL   PRIMARY CARE PHYSICIAN:  Knox Royalty, MD.  REFERRING PHYSICIAN:  Dione Booze, MD  PRESENTING COMPLAINT:  This is a 60 year old female with a history of being mentally handicapped, hypertension and diabetes mellitus.  The patient lives with her sister.  Story is obtained from the sister.  The sister states that the patient has not had a bowel movement in 4 days and abdomen has been progressively getting distended.  The patient had some vomiting yesterday and also complained of some mid abdominal pain.  The patient has a history of colonic.  In the past, she  has been admitted and it resolved with conservative treatment.  The sister often has to give the patient enemas.  She last attempted to give an enema 2 days ago, but she was not able to "get it in," prior to that attempt it had been 3 days since last enema was given.  The patient did eat some oat meal and prune juice this morning.  Sister is not sure if she vomited it back up as the patient did go to the bathroom.  The patient does not talk much and only the sister is able to understand her.  PAST MEDICAL HISTORY: 1. CVA at age 82 which is possibly what left her mentally handicapped.     She has no other neurological deficits apparently. 2. Hypertension. 3. Diabetes mellitus.  PAST SURGICAL HISTORY:  None.  SOCIAL HISTORY:  The patient does not smoke or drink or use drugs.  She lives with her sister.  REVIEW OF SYSTEMS:  Unobtainable.  ALLERGIES:  No known drug allergies.  MEDICATIONS:  According to med rec 1. Multivitamin one daily. 2. MiraLax 17 g daily. 3. Lisinopril 40 mg daily. 4. Lantus 10 units  every morning. 5. Coreg 3.125 mg 2 tablets every morning. 6. Bisacodyl 5 mg daily. 7. Aspirin 81 mg daily. 8. Amlodipine 10 mg daily.  PHYSICAL EXAM:  GENERAL:  Middle-aged female lying in bed in no acute distress. VITAL SIGNS:  Temperature 98.4, respiratory rate 20, pulse 117 and now 97, blood pressure is 171/77, oxygen saturation 99 on room air. HEENT:  Pupils equal, round, reactive to light.  Extraocular movements are intact.  Conjunctiva is pink.  Oral mucosa is moist. NECK:  Supple.  No thyromegaly, lymphadenopathy, carotid bruits. HEART:  Regular rate and rhythm.  No murmurs. ABDOMEN:  Distended.  I am unable to hear any bowel sounds.  There is mild diffuse tenderness. EXTREMITIES:  No cyanosis, clubbing or edema.  Pedal pulses are positive. NEUROLOGIC:  Cranial nerves appear grossly intact.  She is able to move all four extremities.  She does follow commands.  BLOOD WORK:  Metabolic panel reveals a potassium that is slightly low at 3.4.  His CBC is within normal limits.  Chest x-ray, abdomen two-view per report reveals a chronic colonic  dilatation with sigmoid maximally measuring 87 cm.  According to the report this is slightly smaller than on prior study.  There is no free air.  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  Moderate left hip osteoarthritis.  Opacity of distal rectal gas appears similar to prior exam.  Small bowel dilatation is mild.  There are small air fluid levels present within the colon and no organomegaly.  ASSESSMENT/PLAN: 1. Colonic ileus.  Dr. Preston Fleeting has spoken with GI and Surgery who have     yet to evaluate her, but I have stated to him they will see her     soon.  There was a question during her last admission of whether or     not the patient may need a colostomy, at that time since problem     resolved on its own she obviously did not require one.  I suspect     this is why Surgery  has been recalled this time. For now, I will maintain her n.p.o.  We will give her low-dose morphine if she has severe pain.  I will change her medications over to IV. 1. Hypertension.  We will start IV Lopressor. 2. Diabetes mellitus.  We will start a NovoLog sliding scale.  I will     hold her Lantus for now. 3. Mentally handicapped, the sister will sit with her for now but she     states that the patient will need a sitter as she does tend to pull     on her IVs, therefore, I have ordered a sitter.  The patient's sister would like the patient to be a full code.  There is no power of attorney and her sister is her main caretaker.  Time on patient care was 50 minutes.     Calvert Cantor, M.D.     SR/MEDQ  D:  07/24/2011  T:  07/24/2011  Job:  [id number]  cc:   Knox Royalty, MD Fax: [phone]

## 2011-07-25 LAB — CBC
HCT: 38.2 % (ref 36.0–46.0)
MCH: 28.3 pg (ref 26.0–34.0)
MCV: 87.2 fL (ref 78.0–100.0)
Platelets: 376 10*3/uL (ref 150–400)
RDW: 14.7 % (ref 11.5–15.5)
WBC: 9.4 10*3/uL (ref 4.0–10.5)

## 2011-07-25 LAB — GLUCOSE, CAPILLARY
Glucose-Capillary: 116 mg/dL — ABNORMAL HIGH (ref 70–99)
Glucose-Capillary: 129 mg/dL — ABNORMAL HIGH (ref 70–99)
Glucose-Capillary: 106 mg/dL — ABNORMAL HIGH (ref 70–99)

## 2011-07-25 LAB — BASIC METABOLIC PANEL
BUN: 4 mg/dL — ABNORMAL LOW (ref 6–23)
CO2: 26 mEq/L (ref 19–32)
Calcium: 9.3 mg/dL (ref 8.4–10.5)
Chloride: 99 mEq/L (ref 96–112)
Creatinine, Ser: 0.48 mg/dL — ABNORMAL LOW (ref 0.50–1.10)
GFR calc Af Amer: >60 mL/min (ref >60)

## 2011-07-25 LAB — URINE CULTURE: Culture  Setup Time: 201208141739

## 2011-07-26 ENCOUNTER — Inpatient Hospital Stay (HOSPITAL_COMMUNITY): Payer: Medicare Other

## 2011-07-26 LAB — BASIC METABOLIC PANEL
Chloride: 104 mEq/L (ref 96–112)
Creatinine, Ser: 0.56 mg/dL (ref 0.50–1.10)
GFR calc Af Amer: >60 mL/min (ref >60)
GFR calc non Af Amer: >60 mL/min (ref >60)
Potassium: 4.7 mEq/L (ref 3.5–5.1)

## 2011-07-26 LAB — GLUCOSE, CAPILLARY
Glucose-Capillary: 115 mg/dL — ABNORMAL HIGH (ref 70–99)
Glucose-Capillary: 135 mg/dL — ABNORMAL HIGH (ref 70–99)
Glucose-Capillary: 98 mg/dL (ref 70–99)
Glucose-Capillary: 125 mg/dL — ABNORMAL HIGH (ref 70–99)
Glucose-Capillary: 114 mg/dL — ABNORMAL HIGH (ref 70–99)

## 2011-07-27 LAB — GLUCOSE, CAPILLARY
Glucose-Capillary: 101 mg/dL — ABNORMAL HIGH (ref 70–99)
Glucose-Capillary: 119 mg/dL — ABNORMAL HIGH (ref 70–99)
Glucose-Capillary: 137 mg/dL — ABNORMAL HIGH (ref 70–99)
Glucose-Capillary: 228 mg/dL — ABNORMAL HIGH (ref 70–99)

## 2011-07-28 ENCOUNTER — Inpatient Hospital Stay (HOSPITAL_COMMUNITY): Payer: Medicare Other

## 2011-07-28 LAB — GLUCOSE, CAPILLARY
Glucose-Capillary: 104 mg/dL — ABNORMAL HIGH (ref 70–99)
Glucose-Capillary: 127 mg/dL — ABNORMAL HIGH (ref 70–99)
Glucose-Capillary: 93 mg/dL (ref 70–99)

## 2011-07-28 LAB — CBC
HCT: 37.8 % (ref 36.0–46.0)
Hemoglobin: 12.3 g/dL (ref 12.0–15.0)
MCV: 86.7 fL (ref 78.0–100.0)
RBC: 4.36 MIL/uL (ref 3.87–5.11)
RDW: 14.4 % (ref 11.5–15.5)
WBC: 9.9 10*3/uL (ref 4.0–10.5)

## 2011-07-28 LAB — BASIC METABOLIC PANEL
BUN: 3 mg/dL — ABNORMAL LOW (ref 6–23)
CO2: 23 mEq/L (ref 19–32)
Chloride: 103 mEq/L (ref 96–112)
Creatinine, Ser: 0.6 mg/dL (ref 0.50–1.10)
GFR calc Af Amer: >60 mL/min (ref >60)
Potassium: 4.5 mEq/L (ref 3.5–5.1)

## 2011-07-28 LAB — TSH: TSH: 0.664 u[IU]/mL (ref 0.350–4.500)

## 2011-07-29 LAB — GLUCOSE, CAPILLARY
Glucose-Capillary: 140 mg/dL — ABNORMAL HIGH (ref 70–99)
Glucose-Capillary: 151 mg/dL — ABNORMAL HIGH (ref 70–99)
Glucose-Capillary: 164 mg/dL — ABNORMAL HIGH (ref 70–99)
Glucose-Capillary: 85 mg/dL (ref 70–99)

## 2011-07-30 ENCOUNTER — Inpatient Hospital Stay (HOSPITAL_COMMUNITY): Payer: Medicare Other

## 2011-07-30 LAB — BASIC METABOLIC PANEL
BUN: 6 mg/dL (ref 6–23)
Creatinine, Ser: 0.6 mg/dL (ref 0.50–1.10)
GFR calc non Af Amer: >60 mL/min (ref >60)
Glucose, Bld: 74 mg/dL (ref 70–99)
Potassium: 4 mEq/L (ref 3.5–5.1)

## 2011-07-30 LAB — GLUCOSE, CAPILLARY: Glucose-Capillary: 126 mg/dL — ABNORMAL HIGH (ref 70–99)

## 2011-09-07 ENCOUNTER — Emergency Department (HOSPITAL_COMMUNITY): Payer: Medicare Other

## 2011-09-07 ENCOUNTER — Observation Stay (HOSPITAL_COMMUNITY)
Admission: EM | Admit: 2011-09-07 | Discharge: 2011-09-09 | Disposition: A | Discharge status: Home / Self Care | Payer: Medicare Other | Attending: Internal Medicine | Admitting: Internal Medicine

## 2011-09-07 DIAGNOSIS — Z7982 Long term (current) use of aspirin: Secondary | ICD-10-CM | POA: Insufficient documentation

## 2011-09-07 DIAGNOSIS — Z79899 Other long term (current) drug therapy: Secondary | ICD-10-CM | POA: Insufficient documentation

## 2011-09-07 DIAGNOSIS — F79 Unspecified intellectual disabilities: Secondary | ICD-10-CM | POA: Insufficient documentation

## 2011-09-07 DIAGNOSIS — I10 Essential (primary) hypertension: Secondary | ICD-10-CM | POA: Insufficient documentation

## 2011-09-07 DIAGNOSIS — E119 Type 2 diabetes mellitus without complications: Secondary | ICD-10-CM | POA: Insufficient documentation

## 2011-09-07 DIAGNOSIS — K5900 Constipation, unspecified: Principal | ICD-10-CM | POA: Insufficient documentation

## 2011-09-07 DIAGNOSIS — R110 Nausea: Secondary | ICD-10-CM | POA: Insufficient documentation

## 2011-09-07 LAB — URINALYSIS, ROUTINE W REFLEX MICROSCOPIC
Bilirubin Urine: NEGATIVE
Hgb urine dipstick: NEGATIVE
Nitrite: NEGATIVE
Specific Gravity, Urine: 1.014 (ref 1.005–1.030)
pH: 6 (ref 5.0–8.0)

## 2011-09-07 LAB — GLUCOSE, CAPILLARY
Glucose-Capillary: 139 mg/dL — ABNORMAL HIGH (ref 70–99)
Glucose-Capillary: 77 mg/dL (ref 70–99)
Glucose-Capillary: 106 mg/dL — ABNORMAL HIGH (ref 70–99)

## 2011-09-07 LAB — COMPREHENSIVE METABOLIC PANEL
ALT: 19 U/L (ref 0–35)
Albumin: 3.6 g/dL (ref 3.5–5.2)
Alkaline Phosphatase: 102 U/L (ref 39–117)
Potassium: 3.3 mEq/L — ABNORMAL LOW (ref 3.5–5.1)
Sodium: 139 mEq/L (ref 135–145)
Total Protein: 7.8 g/dL (ref 6.0–8.3)

## 2011-09-07 LAB — DIFFERENTIAL
Basophils Absolute: 0 10*3/uL (ref 0.0–0.1)
Basophils Relative: 0 % (ref 0–1)
Eosinophils Relative: 1 % (ref 0–5)
Monocytes Absolute: 0.4 10*3/uL (ref 0.1–1.0)
Neutro Abs: 5.4 10*3/uL (ref 1.7–7.7)

## 2011-09-07 LAB — URINE MICROSCOPIC-ADD ON

## 2011-09-07 LAB — CBC
MCHC: 33.6 g/dL (ref 30.0–36.0)
RDW: 13.6 % (ref 11.5–15.5)

## 2011-09-08 LAB — GLUCOSE, CAPILLARY: Glucose-Capillary: 213 mg/dL — ABNORMAL HIGH (ref 70–99)

## 2011-09-09 ENCOUNTER — Observation Stay (HOSPITAL_COMMUNITY): Payer: Medicare Other

## 2011-09-11 NOTE — H&P (Signed)
NAMEALLEAN, MONTFORT             ACCOUNT NO.:  [id number]  MEDICAL RECORD NO.:  [id number]  LOCATION:  1535                         FACILITY:  [facility]  PHYSICIAN:  Tarry Kos, MD       DATE OF BIRTH:  1951-08-24  DATE OF ADMISSION:  09/07/2011 DATE OF DISCHARGE:                             HISTORY & PHYSICAL   CHIEF COMPLAINT:  Constipated.  HISTORY OF PRESENT ILLNESS:  Ms. Shukla is a 60 year old African American female with history of mental retardation, hypertension, diabetes who lives with her sister and is taken care of by her sister at home, who has chronic issues with chronic colonic ileus and chronic constipation, who comes in because she has not had a bowel movement in 4 days and her abdomen has becoming more distended.  She has had multiple admissions for this over the last several months, has actually had a surgical evaluation for colectomy, however, Surgery did not think that she was a good candidate due to her other comorbidities.  She has fiber in her diet every day.  She takes MiraLax daily, bisacodyl daily.  She does enemas on a regular basis, however, her sister says she started to complain of nausea, so she brought her in.  She is found to be very constipated.  We are being asked to admit the patient for obstipation.  REVIEW OF SYSTEMS:  She has not been having any fever, vomiting, diarrhea, again last bowel movement 4 days ago.  No other complaints of pain.  PAST MEDICAL HISTORY: 1. Mental retardation. 2. Hypertension. 3. Diabetes. 4. CVA at the age of 4 with no residual permanent defects. 5. Chronic colonic ileus.  MEDICATIONS: 1. Lantus 10 units subcutaneously q.a.m. 2. Reglan 10 mg 3 times a day with meals. 3. Amlodipine 10 mg daily. 4. Lisinopril/HCTZ 20/12.5 twice a day. 5. Coreg 12.5 mg twice a day. 6. Multivitamin a day. 7. MiraLax 17 g daily. 8. Bisacodyl 5 mg daily. 9. Aspirin 81 mg daily. 10.Enemas as needed.  ALLERGIES:   None.  SOCIAL HISTORY:  She lives with her sister and is taken care of by her sister.  She does not smoke.  No alcohol.  PHYSICAL EXAMINATION:  VITAL SIGNS:  Afebrile, stable. GENERAL:  She is alert.  She is oriented.  She is in no apparent distress.  She is verbal. HEENT:  Extraocular muscles intact.  Pupils equal, reactive to light. Oropharynx clear.  Mucous membranes moist. NECK:  No JVD.  No carotid bruits. COR:  Regular rate and rhythm without murmurs, rubs, or gallops. CHEST:  Clear to auscultation bilaterally.  No wheezes or rales. ABDOMEN:  Soft, nontender, nondistended.  Positive bowel sounds.  No hepatosplenomegaly. EXTREMITIES:  No clubbing, cyanosis, or edema. PSYCH:  Normal affect.  No focal neurologic deficits.  LABORATORY DATA:  Her complete metabolic panel is basically normal.  CBC is normal.  Urinalysis is negative.  Abdominal x-ray shows a lot of stool.  ASSESSMENT AND PLAN:  This is a 60 year old female with obstipation, constipation. 1. Obstipation.  We will provide Fleet's enema, MiraLax, suppositoriesovernight and hopefully be able to get her bowels moving. 2. Diabetes.  Continue her Lantus. 3. Hypertension.  This is stable.  4. Further recommendations pending on overall hospital course.          ______________________________ Tarry Kos, MD     RD/MEDQ  D:  09/07/2011  T:  09/07/2011  Job:  [id number]  Electronically Signed by Tarry Kos MD on 09/11/2011 03:08:34 PM

## 2011-09-18 NOTE — Discharge Summary (Signed)
  [name], [name]             ACCOUNT NO.:  [id number]  MEDICAL RECORD NO.:  [id number]  LOCATION:  1535                         FACILITY:  [facility]  PHYSICIAN:  Marinda Elk, M.D.DATE OF BIRTH:  [birth year]  DATE OF ADMISSION:  09/07/2011 DATE OF DISCHARGE:                              DISCHARGE SUMMARY   PRIMARY CARE PHYSICIAN:  Knox Royalty, MD  DISCHARGE DIAGNOSES: 1. 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 2. Diabetes type 2. 3. Hypertension.  DISCHARGE MEDICATIONS: 1. Senokot 1 tablet p.o. b.i.d. 2. Amlodipine 10 mg daily. 3. Aspirin 81 mg daily. 4. Bisacodyl one tab daily. 5. Coreg 12.5 mg p.o. b.i.d. 6. Lantus 10 units daily. 7. Lisinopril/hydrochlorothiazide 20/12.5 mg p.o. b.i.d. 8. MiraLax 17 g daily. 9. Multivitamin one tab daily. 10.Reglan 10 mg p.o. t.i.d.  IMAGING:  Abdominal x-ray which shows stable colonic ileus, new probable rectosigmoid fecal impaction.  No evidence of bowel obstruction or free air.  BRIEF ADMITTING HISTORY AND PHYSICAL:  This is a 60 year old female with past medical history of mental retardation, hypertension, also colonic inertia that comes in for obstipation.  Her last bowel movement was 4 to 5 days before to admission, so we were asked to admit and further evaluate.  Labs on day of discharge show urine pregnancy test was negative.  Her white count was 8.8, hemoglobin of 13, platelet count 300, ANC of 544.  His sodium was 139, potassium 3.3, chloride 102, bicarb 27, glucose of 119, BUN of 10, creatinine 0.5.  LFTs within normal limits.  Her lipase was 34.  BRIEF HOSPITAL COURSE: 1. Obstipation probably secondary to colonic inertia.  She was given     several tap water enemas.  Her bowel regimen was more aggressively     treated.  She was on MiraLax and docusate when she came to the     hospital.  Senokot was added.  She has multiple bowel movement on     this first day, but on her second day, she had a huge large  bowel     movement.  Her belly distention decreased.  So, she was discharged     in stable condition. 2. Diabetes, currently well-controlled.  No changes. 3. Hypertension, controlled.  No changes.  DISPOSITION:  The patient will follow up with her primary care doctor in 2 to 4 weeks.  He will see how she is doing from her bowel regimen.  PHYSICAL EXAMINATION:  VITAL SIGNS:  On day of discharge, temperature 98, pulse of 84, respirations 20, blood pressure 126/75.  She was satting 100% on room air.     Marinda Elk, M.D.     AF/MEDQ  D:  09/09/2011  T:  09/09/2011  Job:  [id number]  cc:   Knox Royalty, MD Fax: [phone]  Electronically Signed by Marinda Elk M.D. on 09/18/2011 08:37:22 AM

## 2011-09-20 NOTE — Discharge Summary (Signed)
[name], [name]             ACCOUNT NO.:  [id number]  MEDICAL RECORD NO.:  [id number]  LOCATION:  1345                         [facility]  PHYSICIAN:  Altha Harm, MDDATE OF BIRTH:  1951/04/04  DATE OF ADMISSION:  07/24/2011 DATE OF DISCHARGE:  07/30/2011                              DISCHARGE SUMMARY   DISCHARGE DISPOSITION:  Home.  FINAL DISCHARGE DIAGNOSES: 1. Chronic colonic ileus with symptoms of nausea, vomiting, and     abdominal bloating, symptoms now resolved. 2. Hypertension. 3. Mental retardation. 4. Diabetes type 2.  DISCHARGE MEDICATIONS: 1. Carvedilol 3.125 mg p.o. b.i.d.  Please note that the patient was     actually on 2 tablets in the morning and I have now changed it to 1     tablet b.i.d. 2. Maalox 30 mL p.o. q.6 h. p.r.n. 3. Reglan 5 mg p.o. t.i.d. a.c. meals. 4. Aspirin enteric-coated 81 mg p.o. daily. 5. Bisacodyl 5 mg p.o. daily. 6. Lantus 10 units subcu q.a.m. 7. Lisinopril 40 mg p.o. daily. 8. MiraLax 17 g in 8 ounces of fluid p.o. daily. 9. Multivitamin 1 tablet p.o. daily.  DISCONTINUED MEDICATION:  Amlodipine 10 mg p.o. daily. I have spoken with Dr. Knox Royalty and discussed her condition and medications with him.  He stated that the patient had previously been on hydrochlorothiazide/lisinopril.  He will evaluate her blood pressure in 1 week and make a determination as to whether he wants to add the diuretic back into her medications.  CONSULTANTS:  None.  PROCEDURES:  None.  DIAGNOSTIC STUDIES: 1. Two-view abdominal x-ray, which shows chronic colonic dilatation     with sigmoid, maximally measuring 87 cm.  There is no free air.     The appearance is most compatible with chronic ileus. 2. One-view abdominal x-ray shows stable diffuse colonic dilatation     compatible with colonic ileus, possibly chronic. 3. Repeat abdominal x-ray on the 19th, which showed no significant     change.  No free abdominal air seen. 4.  Repeat x-ray, which shows interval colonic decompression after     rectal tube placement with persistent mild gaseous distention of     the transverse colon.  No evidence of bowel obstruction.  PRIMARY CARE PHYSICIAN:  Knox Royalty, MD  CODE STATUS:  Full code.  ALLERGIES:  No known drug allergies.  CHIEF COMPLAINT:  Constipation, abdominal distention, nausea, and vomiting.  HISTORY OF PRESENT ILLNESS:  Please refer to the H and P dictated by Dr. Butler Denmark for details of the HPI, however, in short this is the patient's third admission for the same symptoms of abdominal distention, constipation, nausea, vomiting.  The patient has a chronic colonic ileus, which causes the symptoms to recur.  HOSPITAL COURSE: 1. Chronic colonic distention with symptoms as noted above.  The     patient was given bowel rest.  She was started on clear liquids,     which were advanced.  The patient was ready to be discharged home     and she started having the same symptoms again.  The patient had a     rectal Red Robin tube place and was started on Reglan.  Her diet  was advanced and she tolerated the diet without difficulty.  The     Red Robin tube was discontinued and she still had tolerance of her     diet.  I had a long discussion with the patient's sister who is her     primary caregiver.  On the last hospitalization, she was seen in     consult by Surgery who recommended that at that time that the     definitive treatment for this would be a subtotal colectomy or     diverting ileostomy.  However, it was also noted that given the     patient's mental disabilities, it will be difficult for her to deal     with an ostomy and the fact is that she is diabetic would pose     concerns about issues of healing.  I have spoken at length with the     patient's sister who at this point continue to try conservative     measures.  On this admission, Reglan was added.  The patient had     been on calcium  channel blocker which was discontinued due to the     fact that it would likely have a possibility of slowing down the     contractions and peristaltic movement. 2. Hypertension.  The patient's blood pressures remained in the 130     while on her medications and without any Norvasc.  I have spoken     with Dr. Yetta Barre about this and apparently the patient had been on a     combination of lisinopril/hydrochlorothiazide.  Presently, she is     not on hydrochlorothiazide.  They will see Dr. Yetta Barre in 1 week and     he will reevaluate the blood pressure at that time to determine     whether or not a diuretic needs to be added back to her     medications. 3. Diabetes, blood sugars remained under good control while     hospitalized.  Otherwise, the patient has been stable at the time of discharge, her condition is stable.  PHYSICAL EXAMINATION:  VITAL SIGNS:  Temperature is 97.7, blood pressure 133/71, respiratory rate 18, heart rate 83, O2 sats are 99% on room air. HEENT:  She is normocephalic, atraumatic.  Pupils equally round and reactive to light and accommodation.  Oropharynx is moist.  No exudate, erythema, or lesions are noted. RESPIRATORY:  Normal respiratory effort, equal excursion bilaterally. No wheezing or rhonchi. ABDOMEN:  She has got normal bowel sounds.  Abdomen is soft, nontender, nondistended.  No masses, no hepatosplenomegaly is noted. CARDIOVASCULAR:  She has got normal S1, S2.  No murmurs, rubs, or gallops noted.  PMI is nondisplaced.  No heaves or thrills on palpation. EXTREMITIES:  No clubbing, cyanosis, or edema.  LABORATORY DATA:  At the time of discharge, sodium 135, potassium 4.0, chloride 101, bicarb 21, BUN 6, creatinine 0.60.  DIETARY RESTRICTIONS:  The patient should be on a diabetic heart-healthy diet.  PHYSICAL RESTRICTIONS:  Activity as tolerated.  FOLLOWUP:  The patient is to follow up with Dr. Knox Royalty in 1 week.  Total time for this discharge  process including face-to-face time 44 minutes.     Altha Harm, MD     MAM/MEDQ  D:  07/30/2011  T:  07/31/2011  Job:  [id number]  cc:   Knox Royalty, MD Fax: [phone]  Electronically Signed by Marthann Schiller MD on 09/20/2011 07:12:02 AM

## 2011-10-24 ENCOUNTER — Encounter: Payer: Self-pay | Admitting: *Deleted

## 2011-10-24 ENCOUNTER — Inpatient Hospital Stay (HOSPITAL_COMMUNITY)
Admission: EM | Admit: 2011-10-24 | Discharge: 2011-10-27 | DRG: 392 | Disposition: A | Discharge status: Home / Self Care | Payer: Medicare Other | Attending: Internal Medicine | Admitting: Internal Medicine

## 2011-10-24 ENCOUNTER — Emergency Department (HOSPITAL_COMMUNITY): Payer: Medicare Other

## 2011-10-24 DIAGNOSIS — I10 Essential (primary) hypertension: Secondary | ICD-10-CM | POA: Diagnosis present

## 2011-10-24 DIAGNOSIS — K5909 Other constipation: Principal | ICD-10-CM | POA: Diagnosis present

## 2011-10-24 DIAGNOSIS — F71 Moderate intellectual disabilities: Secondary | ICD-10-CM | POA: Diagnosis present

## 2011-10-24 DIAGNOSIS — K5900 Constipation, unspecified: Secondary | ICD-10-CM | POA: Diagnosis present

## 2011-10-24 DIAGNOSIS — E119 Type 2 diabetes mellitus without complications: Secondary | ICD-10-CM | POA: Diagnosis present

## 2011-10-24 HISTORY — DX: Essential (primary) hypertension: I10

## 2011-10-24 HISTORY — DX: Cerebral infarction, unspecified: I63.9

## 2011-10-24 LAB — CBC
Hemoglobin: 12.7 g/dL (ref 12.0–15.0)
MCV: 87.7 fL (ref 78.0–100.0)
Platelets: 355 10*3/uL (ref 150–400)
RBC: 4.47 MIL/uL (ref 3.87–5.11)
WBC: 9.7 10*3/uL (ref 4.0–10.5)

## 2011-10-24 LAB — COMPREHENSIVE METABOLIC PANEL
ALT: 18 U/L (ref 0–35)
Alkaline Phosphatase: 97 U/L (ref 39–117)
BUN: 8 mg/dL (ref 6–23)
CO2: 26 mEq/L (ref 19–32)
Calcium: 10.1 mg/dL (ref 8.4–10.5)
GFR calc Af Amer: >90 mL/min (ref >90)
GFR calc non Af Amer: >90 mL/min (ref >90)
Glucose, Bld: 106 mg/dL — ABNORMAL HIGH (ref 70–99)
Potassium: 3.8 mEq/L (ref 3.5–5.1)
Sodium: 139 mEq/L (ref 135–145)

## 2011-10-24 LAB — GLUCOSE, CAPILLARY: Glucose-Capillary: 91 mg/dL (ref 70–99)

## 2011-10-24 LAB — DIFFERENTIAL
Eosinophils Relative: 0 % (ref 0–5)
Lymphocytes Relative: 17 % (ref 12–46)
Lymphs Abs: 1.7 10*3/uL (ref 0.7–4.0)
Monocytes Relative: 5 % (ref 3–12)

## 2011-10-24 LAB — URINE MICROSCOPIC-ADD ON

## 2011-10-24 LAB — URINALYSIS, ROUTINE W REFLEX MICROSCOPIC
Bilirubin Urine: NEGATIVE
Hgb urine dipstick: NEGATIVE
Protein, ur: NEGATIVE mg/dL
Urobilinogen, UA: 1 mg/dL (ref 0.0–1.0)

## 2011-10-24 MED ORDER — METOPROLOL TARTRATE 1 MG/ML IV SOLN
5.0000 mg | Freq: Four times a day (QID) | INTRAVENOUS | Status: DC
  Administered 2011-10-24 – 2011-10-26 (×8): 5 mg via INTRAVENOUS
  Filled 2011-10-24 (×11): qty 5

## 2011-10-24 MED ORDER — ONDANSETRON HCL 4 MG PO TABS: 4.0000 mg | ORAL_TABLET | Freq: Four times a day (QID) | ORAL | Status: DC | PRN

## 2011-10-24 MED ORDER — ACETAMINOPHEN 650 MG RE SUPP: 650.0000 mg | Freq: Four times a day (QID) | RECTAL | Status: DC | PRN

## 2011-10-24 MED ORDER — DEXTROSE-NACL 5-0.9 % IV SOLN
INTRAVENOUS | Status: DC
  Administered 2011-10-24 – 2011-10-26 (×3): via INTRAVENOUS

## 2011-10-24 MED ORDER — BISACODYL 10 MG RE SUPP
10.0000 mg | Freq: Every day | RECTAL | Status: DC | PRN
  Administered 2011-10-25: 10 mg via RECTAL
  Filled 2011-10-24 (×2): qty 1

## 2011-10-24 MED ORDER — MORPHINE SULFATE 2 MG/ML IJ SOLN: 2.0000 mg | INTRAMUSCULAR | Status: DC | PRN

## 2011-10-24 MED ORDER — METOPROLOL TARTRATE 1 MG/ML IV SOLN
5.0000 mg | Freq: Once | INTRAVENOUS | Status: AC
  Administered 2011-10-24: 5 mg via INTRAVENOUS

## 2011-10-24 MED ORDER — METOPROLOL TARTRATE 1 MG/ML IV SOLN
INTRAVENOUS | Status: AC
  Filled 2011-10-24: qty 5

## 2011-10-24 MED ORDER — ONDANSETRON HCL 4 MG/2ML IJ SOLN: 4.0000 mg | Freq: Four times a day (QID) | INTRAMUSCULAR | Status: DC | PRN

## 2011-10-24 MED ORDER — FLEET ENEMA 7-19 GM/118ML RE ENEM
1.0000 | ENEMA | Freq: Once | RECTAL | Status: AC
  Administered 2011-10-24: 1 via RECTAL
  Filled 2011-10-24: qty 1

## 2011-10-24 MED ORDER — SODIUM CHLORIDE 0.9 % IV SOLN: INTRAVENOUS | Status: AC

## 2011-10-24 MED ORDER — PANTOPRAZOLE SODIUM 40 MG IV SOLR
40.0000 mg | INTRAVENOUS | Status: DC
  Administered 2011-10-24 – 2011-10-25 (×2): 40 mg via INTRAVENOUS
  Filled 2011-10-24 (×3): qty 40

## 2011-10-24 MED ORDER — FLEET ENEMA 7-19 GM/118ML RE ENEM
1.0000 | ENEMA | Freq: Every day | RECTAL | Status: DC | PRN
  Filled 2011-10-24: qty 1

## 2011-10-24 MED ORDER — INSULIN ASPART 100 UNIT/ML ~~LOC~~ SOLN
0.0000 [IU] | SUBCUTANEOUS | Status: DC
  Administered 2011-10-25 (×3): 1 [IU] via SUBCUTANEOUS
  Administered 2011-10-25: 2 [IU] via SUBCUTANEOUS
  Administered 2011-10-26 (×3): 1 [IU] via SUBCUTANEOUS
  Administered 2011-10-26: 2 [IU] via SUBCUTANEOUS
  Administered 2011-10-26: 1 [IU] via SUBCUTANEOUS
  Filled 2011-10-24: qty 3

## 2011-10-24 MED ORDER — ACETAMINOPHEN 325 MG PO TABS: 650.0000 mg | ORAL_TABLET | Freq: Four times a day (QID) | ORAL | Status: DC | PRN

## 2011-10-24 NOTE — H&P (Signed)
PCP:   Paulino Rily, MD   Chief Complaint:  Abdominal pain  HPI: Patient is a 60 year old F. American female past history stroke, mental retardation and chronic colonic ileus who presents with abdominal distention and pain for the past he days. Number she has a bowel movement every 2-3 days but has not had one in 4 days. She is a previous history of obstipation. She's been recently admitted approximately one and a half months ago for the same issue. Patient's films in the emergency room confirmed signs of obstipation with no large amount of stool were no signs of any ileus. No signs of any small bowel distraction.  When I saw the patient, she was in the emergency room. No family was present. She's not able to give me a review of systems secondary to her mental retardation.  Past Medical History: Past Medical History  Diagnosis Date  . Hypertension   . Stroke   . Diabetes mellitus    History reviewed. No pertinent past surgical history.  Medications: Prior to Admission medications   Medication Sig Start Date End Date Taking? Authorizing Provider  bisacodyl (FLEET) 10 MG/30ML ENEM Place 10 mg rectally once.     Yes Historical Provider, MD  carvedilol (COREG) 12.5 MG tablet Take 12.5 mg by mouth 2 (two) times daily with a meal.     Yes Historical Provider, MD  insulin glargine (LANTUS) 100 UNIT/ML injection Inject 20 Units into the skin every morning.     Yes Historical Provider, MD  lisinopril-hydrochlorothiazide (PRINZIDE,ZESTORETIC) 20-12.5 MG per tablet Take 1 tablet by mouth 2 (two) times daily.     Yes Historical Provider, MD  metoCLOPramide (REGLAN) 10 MG tablet Take 10 mg by mouth 2 (two) times daily.     Yes Historical Provider, MD  Multiple Vitamins-Minerals (MULTIVITAMINS THER. W/MINERALS) TABS Take 1 tablet by mouth daily.     Yes Historical Provider, MD    Allergies:  No Known Allergies  Social History:  reports that she has never smoked. She has never used smokeless  tobacco. She reports that she does not drink alcohol or use illicit drugs.  Family History: Based on previous records she is a history of hypertension in her family.  Physical Exam: Filed Vitals:   10/24/11 0936 10/24/11 0937 10/24/11 1152 10/24/11 1525  BP:  118/106 190/102 189/105  Pulse: 116 114 91 109  Temp: 98.4 F (36.9 C) 98.4 F (36.9 C) 98.7 F (37.1 C) 98.7 F (37.1 C)  TempSrc: Oral Oral Oral Oral  Resp:  18 24 18   SpO2:  100% 100% 99%   General: Alert. Does not verbalize much. Difficult to say if she is able to follow commands. No apparent stress. Cardiovascular: Regular rhythm mild tachycardia. Lungs: Pulses bilaterally. Abdomen: Soft, distended,? Mild tenderness throughout, few bowel sounds. Extremities: No clubbing cyanosis or edema   Labs on Admission:   [facility] 10/24/11 1106  NA 139  K 3.8  CL 102  CO2 26  GLUCOSE 106*  BUN 8  CREATININE 0.62  CALCIUM 10.1  MG --  PHOS --    Basename 10/24/11 1106  AST 25  ALT 18  ALKPHOS 97  BILITOT 0.5  PROT 8.2  ALBUMIN 4.2    Basename 10/24/11 1106  LIPASE 32  AMYLASE --    Basename 10/24/11 1106  WBC 9.7  NEUTROABS 7.5  HGB 12.7  HCT 39.2  MCV 87.7  PLT 355    Radiological Exams on Admission: Dg Abd Acute W/chest  10/24/2011  *  RADIOLOGY REPORT*  Clinical Data: Distended abdomen, constipation  ACUTE ABDOMEN SERIES (ABDOMEN 2 VIEW & CHEST 1 VIEW)  Comparison: Abdomen film of 09/09/2011 and chest with acute abdomen of 09/07/2011  Findings: The lungs are clear.  The heart is within normal limits in size.  No bony abnormality is seen.  Considerable gaseous distention primarily of the colon is unchanged.  There is very little air within the rectosigmoid colon region.  This pattern appears similar to previous plain films and CT from April 2012. No free air is seen.  This may represent pseudo obstruction, although as noted on the prior CT, a rectal lesion cannot be excluded.  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  IMPRESSION:  1.  No significant change in gaseous distention of primarily the colon.  Possible pseudo-obstruction.  Cannot exclude rectal lesion. 2.  No free air. 3.  No active lung disease.  Original Report Authenticated By: Juline Patch, M.D.    Assessment/Plan Present on Admission:  .Obstipation: Plan to make the patient n.p.o. and use as before previous tap water enemas. PPI while not taking anything in.  .DM type 2 (diabetes mellitus, type 2) every 4 sliding scale  .HTN (hypertension) holding by mouth meds, IV Lopressor every 6 hours.  Jacee Enerson K 10/24/2011, 5:44 PM

## 2011-10-24 NOTE — ED Notes (Signed)
Patient transported to Ultrasound 

## 2011-10-24 NOTE — ED Provider Notes (Signed)
History     CSN: [id number] Arrival date & time: 10/24/2011  9:22 AM   First MD Initiated Contact with Patient 10/24/11 [phone]      Chief Complaint  Patient presents with  . Constipation    (Consider location/radiation/quality/duration/timing/severity/associated sxs/prior treatment) HPI Comments: Patient with history of remote stroke, mental retardation, chronic colonic ileus presenting with abdominal distention and pain for the past 4 days. Sister and caregiver stated the patient usually has a bowel movement every 2 or 3 days but has not had one in 4 days. 2 one episode of vomiting yesterday and an episode of dry heaving this morning. She's had decreased appetite.  She is a long history of constipation and takes frequent over-the-counter he'll softeners, enemas, mineral oil. She was referred for a colectomy in the past but surgery decided she was not a good candidate.  Sr. reports increase in patient's abdominal distention as well as pain. No history of bowel surgeries, no history of mechanical obstruction.  The history is provided by a caregiver. The history is limited by a developmental delay.    Past Medical History  Diagnosis Date  . Hypertension   . Stroke   . Diabetes mellitus     History reviewed. No pertinent past surgical history.  History reviewed. No pertinent family history.  History  Substance Use Topics  . Smoking status: Never Smoker   . Smokeless tobacco: Never Used  . Alcohol Use: No    OB History    Grav Para Term Preterm Abortions TAB SAB Ect Mult Living                  Review of Systems  Unable to perform ROS: Other  Gastrointestinal: Positive for constipation.    Allergies  Review of patient's allergies indicates no known allergies.  Home Medications   Current Outpatient Rx  Name Route Sig Dispense Refill  . BISACODYL 10 MG/30ML RE ENEM Rectal Place 10 mg rectally once.      Marland Kitchen CARVEDILOL 12.5 MG PO TABS Oral Take 12.5 mg by mouth 2 (two)  times daily with a meal.      . INSULIN GLARGINE 100 UNIT/ML Palmer SOLN Subcutaneous Inject 20 Units into the skin every morning.      Marland Kitchen LISINOPRIL-HYDROCHLOROTHIAZIDE 20-12.5 MG PO TABS Oral Take 1 tablet by mouth 2 (two) times daily.      Marland Kitchen METOCLOPRAMIDE HCL 10 MG PO TABS Oral Take 10 mg by mouth 2 (two) times daily.      Carma Leaven M PLUS PO TABS Oral Take 1 tablet by mouth daily.        BP 190/102  Pulse 91  Temp(Src) 98.7 F (37.1 C) (Oral)  Resp 24  SpO2 100%  Physical Exam  Constitutional: She appears well-developed and well-nourished. No distress.  HENT:  Head: Normocephalic and atraumatic.  Mouth/Throat: Oropharynx is clear and moist. No oropharyngeal exudate.  Eyes: Conjunctivae are normal. Pupils are equal, round, and reactive to light.  Neck: Normal range of motion.  Cardiovascular: Normal rate, regular rhythm and normal heart sounds.   Pulmonary/Chest: Effort normal and breath sounds normal. No respiratory distress.  Abdominal: Soft. She exhibits distension. There is tenderness. There is no rebound and no guarding.       Distended, minimally tender diffusely, no guarding or rebound  Genitourinary:       Rectal exam reveals no hemorrhoids or fissures. Empty rectal vault, guaiac negative stools  Musculoskeletal: Normal range of motion. She exhibits no edema  and no tenderness.  Neurological: She is alert. No cranial nerve deficit.       Follows commands, unable to provide history.  Skin: Skin is warm.    ED Course  Procedures (including critical care time)  Labs Reviewed  COMPREHENSIVE METABOLIC PANEL - Abnormal; Notable for the following:    Glucose, Bld 106 (*)    All other components within normal limits  URINALYSIS, ROUTINE W REFLEX MICROSCOPIC - Abnormal; Notable for the following:    Ketones, ur 15 (*)    Leukocytes, UA TRACE (*)    All other components within normal limits  GLUCOSE, CAPILLARY - Abnormal; Notable for the following:    Glucose-Capillary 115 (*)     All other components within normal limits  URINE MICROSCOPIC-ADD ON - Abnormal; Notable for the following:    Squamous Epithelial / LPF FEW (*)    Bacteria, UA FEW (*)    All other components within normal limits  CBC  DIFFERENTIAL  LIPASE, BLOOD  GLUCOSE, CAPILLARY  POCT CBG MONITORING   Dg Abd Acute W/chest  10/24/2011  *RADIOLOGY REPORT*  Clinical Data: Distended abdomen, constipation  ACUTE ABDOMEN SERIES (ABDOMEN 2 VIEW & CHEST 1 VIEW)  Comparison: Abdomen film of 09/09/2011 and chest with acute abdomen of 09/07/2011  Findings: The lungs are clear.  The heart is within normal limits in size.  No bony abnormality is seen.  Considerable gaseous distention primarily of the colon is unchanged.  There is very little air within the rectosigmoid colon region.  This pattern appears similar to previous plain films and CT from April 2012. No free air is seen.  This may represent pseudo obstruction, although as noted on the prior CT, a rectal lesion cannot be excluded.  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  IMPRESSION:  1.  No significant change in gaseous distention of primarily the colon.  Possible pseudo-obstruction.  Cannot exclude rectal lesion. 2.  No free air. 3.  No active lung disease.  Original Report Authenticated By: Juline Patch, M.D.     1. Obstipation       MDM  Abdominal pain, distension, constipation, history of similar.  Patient was admitted with similar presentation on 9/28 for tap water enemas and rectal tube.   Abdomen distended but soft.  Persistent colonic dilation with pseudoobstruction on Xray.  Patient remains obstipated in the ED, tachycardic.  Admission discussed with hospitalist Dr. Rito Ehrlich.       Glynn Octave, MD 10/24/11 [phone]

## 2011-10-24 NOTE — ED Notes (Signed)
Pt returned ultrasound

## 2011-10-24 NOTE — ED Notes (Signed)
Pt not in the room at this time. Will get labs when pt returns to department.

## 2011-10-24 NOTE — ED Notes (Signed)
Pt's sister/caregiver reports pt hasn't had BM in 4 days, pt c/o abd pain. Emesis x1 yesterday.

## 2011-10-24 NOTE — ED Notes (Signed)
Pts caregiver reports pt taking HTN meds, has not had them today. Also reports pt had CVA at 60yo and has mentality of 7yo, sister holds POA.

## 2011-10-24 NOTE — ED Notes (Signed)
Pt sister, Zina notified of the patients room.

## 2011-10-25 LAB — GLUCOSE, CAPILLARY
Glucose-Capillary: 115 mg/dL — ABNORMAL HIGH (ref 70–99)
Glucose-Capillary: 125 mg/dL — ABNORMAL HIGH (ref 70–99)
Glucose-Capillary: 178 mg/dL — ABNORMAL HIGH (ref 70–99)
Glucose-Capillary: 99 mg/dL (ref 70–99)

## 2011-10-25 NOTE — Progress Notes (Signed)
Subjective: Patient not able to interact with me very much, but she appears to be feeling better. She did nod her head yes to being hungry.  Objective: Weight change:   Intake/Output Summary (Last 24 hours) at 10/25/11 1457 Last data filed at 10/25/11 1418  Gross per 24 hour  Intake   1504 ml  Output   1252 ml  Net    252 ml   BP 134/84  Pulse 79  Temp(Src) 98.6 F (37 C) (Oral)  Resp 18  Ht 5\' 1"  (1.549 m)  Wt 52 kg (114 lb 10.2 oz)  BMI 21.66 kg/m2  SpO2 98% Gen.: Awake and alert, no apparent distress, patient has moderate mental retardation. HEENT is normocephalic, atraumatic mucous membranes are slightly dry Cardiovascular: Regular rate and rhythm S1-S2 very soft stool out of 6 systolic ejection murmur Lungs appear to be clear to auscultation bilaterally Abdomen: Soft distended, some high-pitched bowel sounds nontender Extremities: No clubbing cyanosis or edema.  Lab Results: Basic Metabolic Panel:  Basename 10/24/11 1106  NA 139  K 3.8  CL 102  CO2 26  GLUCOSE 106*  BUN 8  CREATININE 0.62  CALCIUM 10.1  MG --  PHOS --   Liver Function Tests:  Basename 10/24/11 1106  AST 25  ALT 18  ALKPHOS 97  BILITOT 0.5  PROT 8.2  ALBUMIN 4.2    Basename 10/24/11 1106  LIPASE 32  AMYLASE --   CBC:  Basename 10/24/11 1106  WBC 9.7  NEUTROABS 7.5  HGB 12.7  HCT 39.2  MCV 87.7  PLT 355   CBG:  Basename 10/25/11 1159 10/25/11 0801 10/25/11 0422 10/24/11 2359 10/24/11 2136 10/24/11 1151  GLUCAP 115* 125* 125* 99 120* 91    Medications: Scheduled Meds:   . sodium chloride   Intravenous STAT  . insulin aspart  0-9 Units Subcutaneous Q4H  . metoprolol  5 mg Intravenous Once  . metoprolol  5 mg Intravenous Q6H  . pantoprazole (PROTONIX) IV  40 mg Intravenous Q24H   Continuous Infusions:   . dextrose 5 % and 0.9% NaCl 75 mL/hr at 10/25/11 0818   PRN Meds:.acetaminophen, acetaminophen, bisacodyl, morphine, ondansetron (ZOFRAN) IV, ondansetron, sodium  phosphate  Assessment/Plan: Patient Active Hospital Problem List: Obstipation (10/24/2011)  a recurrent problem for this patient. She is Arrie had some bowel movements with tap water enema. Has been getting now suppository. Have written for a soapsuds enema. We'll start clear liquids.  DM type 2 (diabetes mellitus, type 2) (10/24/2011)  have remained stable. On sliding scale.  HTN (hypertension) (10/24/2011)  stable.  Moderate mental retardation. Patient is cared for by her sister at home.   LOS: 1 day   Jazlen Ogarro K 10/25/2011, 2:57 PM

## 2011-10-25 NOTE — Progress Notes (Signed)
[id number]/Rhonda Davis, RN, BSN, CCM/CHART REVIEW FOR UR PERFORMED. 

## 2011-10-26 LAB — GLUCOSE, CAPILLARY
Glucose-Capillary: 136 mg/dL — ABNORMAL HIGH (ref 70–99)
Glucose-Capillary: 137 mg/dL — ABNORMAL HIGH (ref 70–99)
Glucose-Capillary: 139 mg/dL — ABNORMAL HIGH (ref 70–99)

## 2011-10-26 MED ORDER — METOCLOPRAMIDE HCL 5 MG/ML IJ SOLN
5.0000 mg | Freq: Four times a day (QID) | INTRAMUSCULAR | Status: DC
  Administered 2011-10-26 – 2011-10-27 (×4): 5 mg via INTRAVENOUS
  Filled 2011-10-26: qty 2
  Filled 2011-10-26: qty 1
  Filled 2011-10-26 (×2): qty 2
  Filled 2011-10-26 (×3): qty 1

## 2011-10-26 MED ORDER — PANTOPRAZOLE SODIUM 40 MG PO TBEC
40.0000 mg | DELAYED_RELEASE_TABLET | Freq: Every day | ORAL | Status: DC
  Administered 2011-10-26 – 2011-10-27 (×2): 40 mg via ORAL
  Filled 2011-10-26 (×2): qty 1

## 2011-10-26 MED ORDER — FLEET ENEMA 7-19 GM/118ML RE ENEM
1.0000 | ENEMA | Freq: Once | RECTAL | Status: AC
  Administered 2011-10-26: 1 via RECTAL

## 2011-10-26 MED ORDER — INSULIN GLARGINE 100 UNIT/ML ~~LOC~~ SOLN
10.0000 [IU] | Freq: Every day | SUBCUTANEOUS | Status: DC
  Administered 2011-10-26: 10 [IU] via SUBCUTANEOUS
  Filled 2011-10-26: qty 3

## 2011-10-26 MED ORDER — LISINOPRIL 20 MG PO TABS
20.0000 mg | ORAL_TABLET | Freq: Every day | ORAL | Status: DC
  Administered 2011-10-26 – 2011-10-27 (×2): 20 mg via ORAL
  Filled 2011-10-26 (×2): qty 1

## 2011-10-26 MED ORDER — METOCLOPRAMIDE HCL 5 MG PO TABS
5.0000 mg | ORAL_TABLET | Freq: Three times a day (TID) | ORAL | Status: DC
  Administered 2011-10-26: 5 mg via ORAL
  Filled 2011-10-26 (×6): qty 1

## 2011-10-26 MED ORDER — CARVEDILOL 12.5 MG PO TABS
12.5000 mg | ORAL_TABLET | Freq: Two times a day (BID) | ORAL | Status: DC
  Administered 2011-10-26 – 2011-10-27 (×2): 12.5 mg via ORAL
  Filled 2011-10-26 (×3): qty 1

## 2011-10-26 MED ORDER — POLYETHYLENE GLYCOL 3350 17 G PO PACK
17.0000 g | PACK | Freq: Every day | ORAL | Status: DC
  Administered 2011-10-26 – 2011-10-27 (×2): 17 g via ORAL
  Filled 2011-10-26 (×2): qty 1

## 2011-10-26 MED ORDER — SENNOSIDES-DOCUSATE SODIUM 8.6-50 MG PO TABS
1.0000 | ORAL_TABLET | Freq: Two times a day (BID) | ORAL | Status: DC
  Administered 2011-10-26 – 2011-10-27 (×3): 1 via ORAL
  Filled 2011-10-26 (×4): qty 1

## 2011-10-26 NOTE — Plan of Care (Signed)
Problem: Phase I Progression Outcomes Goal: Voiding-avoid urinary catheter unless indicated Outcome: Completed/Met Date Met:  10/26/11 Pt uses BSC with 1 assist

## 2011-10-26 NOTE — Progress Notes (Addendum)
Interval History: [name] is a 59 year old female with a past medical history of mental retardation and chronic colonic ileus who presented with abdominal distention and pain secondary to obstipation. She was given enemas and had 2 bowel movements on the date of admission but none since. Her sister is concerned that her bowels have not moved since admission and that she continues to have abdominal distention. ROS: [name] states that she is hungry. She denies abdominal pain. She complains of pain at the IV site.   Objective: Vital signs in last 24 hours: Temp:  [97.6 F (36.4 C)-98.2 F (36.8 C)] 97.6 F (36.4 C) (11/16 1344) Pulse Rate:  [67-75] 75  (11/16 1344) Resp:  [16-18] 16  (11/16 1344) BP: (168-184)/(76-86) 184/84 mmHg (11/16 1344) SpO2:  [96 %-100 %] 100 % (11/16 1344) Weight change:  Last BM Date: 10/24/11  Intake/Output from previous day:  Intake/Output Summary (Last 24 hours) at 10/26/11 1431 Last data filed at 10/26/11 1405  Gross per 24 hour  Intake   1930 ml  Output   1275 ml  Net    655 ml   CBG (last 3)   Basename 10/26/11 1144 10/26/11 0739 10/26/11 0423  GLUCAP 177* 136* 137*      Physical Exam:  Gen:  No acute distress Cardiovascular:  Regular rate, and rhythm. No murmurs, rubs, or gallops. Respiratory: Lungs clear to auscultation bilaterally. Diminished at the bases. Gastrointestinal: Distended and firm. Nontender. Diminished bowel sounds. Extremities: No clubbing, edema, or cyanosis.   Lab Results: No new.  Studies/Results: No results found.  Medications: Scheduled Meds:   . insulin aspart  0-9 Units Subcutaneous Q4H  . metoprolol  5 mg Intravenous Q6H  . pantoprazole (PROTONIX) IV  40 mg Intravenous Q24H  . polyethylene glycol  17 g Oral Daily  . senna-docusate  1 tablet Oral BID   Continuous Infusions:   . DISCONTD: dextrose 5 % and 0.9% NaCl 75 mL/hr at 10/26/11 1051   PRN Meds:.acetaminophen, acetaminophen, bisacodyl,  morphine, ondansetron (ZOFRAN) IV, ondansetron, sodium phosphate  Assessment/Plan:  Principal Problem:  *Obstipation The patient's bowels moved after she was given enemas and suppositories on admission. Her bowels have not moved since and she continues to have abdominal distention. I have started her on daily MiraLAX and Peri-Colace twice a day.  Will monitor her another 24 hours on a bowel regimen. We'll resume Reglan but decrease the dose to 5 mg and increase the frequency to q. a.c. and at bedtime. We'll give her an additional Fleet enema today. Active Problems:  DM type 2 (diabetes mellitus, type 2) The patient normally takes 20 units of Lantus daily for diabetes control. This was held while she was n.p.o. We'll start her back at 10 units and monitor her glycemic control closely.  HTN (hypertension) Will resume lisinopril and Coreg comment discontinue IV metoprolol, and continue to hold hydrochlorothiazide.    LOS: 2 days   Hillery Aldo, MD Pager [phone]  10/26/2011, 2:31 PM

## 2011-10-26 NOTE — Progress Notes (Signed)
Nursing pt had no results from fleets and vomited small amt food text page sent to Dr Darnelle Catalan

## 2011-10-27 LAB — GLUCOSE, CAPILLARY
Glucose-Capillary: 117 mg/dL — ABNORMAL HIGH (ref 70–99)
Glucose-Capillary: 81 mg/dL (ref 70–99)

## 2011-10-27 MED ORDER — SENNOSIDES-DOCUSATE SODIUM 8.6-50 MG PO TABS: 1.0000 | ORAL_TABLET | Freq: Two times a day (BID) | ORAL | Status: AC

## 2011-10-27 MED ORDER — BISACODYL 10 MG RE SUPP: 10.0000 mg | Freq: Every day | RECTAL | Status: AC | PRN

## 2011-10-27 MED ORDER — POLYETHYLENE GLYCOL 3350 17 G PO PACK: 17.0000 g | PACK | Freq: Every day | ORAL | Status: AC

## 2011-10-27 NOTE — Progress Notes (Signed)
Patients sister in to pick patient up, sister spoke with discharging MD.  Discharge instructions gone over with sister who verbalized understanding.  MD to call prescriptions in to pharmacy.  Patient transported via wheelchair to front of hospital to be taken home by sister.

## 2011-10-27 NOTE — Plan of Care (Signed)
Problem: Problem: Diabetes Management Progression Goal: INCREASE DIABETES KNOWLEDGE Outcome: Adequate for Discharge Include family in teaching

## 2011-10-27 NOTE — Plan of Care (Signed)
Problem: Problem: Diabetes Management Progression Goal: INCREASE DIABETES KNOWLEDGE Outcome: Completed in [facility] Date Met:  10/27/11 Patient mentally challenged

## 2011-10-27 NOTE — Discharge Summary (Signed)
Physician Discharge Summary  Patient ID: [name] MRN: [id number] DOB/AGE: 1951-05-22 60 y.o.  Admit date: 10/24/2011 Discharge date: 10/27/2011  Primary Care Physician:  Cynthia Rily, MD   Discharge Diagnoses:   .Obstipation .DM type 2 (diabetes mellitus, type 2) .HTN (hypertension)  Discharge Medications:  Current Discharge Medication List    START taking these medications   Details  bisacodyl (DULCOLAX) 10 MG suppository Place 1 suppository (10 mg total) rectally daily as needed for constipation (constipation). Qty: 12 suppository, Refills: 2    polyethylene glycol (MIRALAX / GLYCOLAX) packet Take 17 g by mouth daily. Qty: 14 each, Refills: 9    senna-docusate (SENOKOT-S) 8.6-50 MG per tablet Take 1 tablet by mouth 2 (two) times daily. Qty: 60 tablet, Refills: 9      CONTINUE these medications which have NOT CHANGED   Details  bisacodyl (FLEET) 10 MG/30ML ENEM Place 10 mg rectally once.      carvedilol (COREG) 12.5 MG tablet Take 12.5 mg by mouth 2 (two) times daily with a meal.      insulin glargine (LANTUS) 100 UNIT/ML injection Inject 20 Units into the skin every morning.      lisinopril-hydrochlorothiazide (PRINZIDE,ZESTORETIC) 20-12.5 MG per tablet Take 1 tablet by mouth 2 (two) times daily.      metoCLOPramide (REGLAN) 10 MG tablet Take 10 mg by mouth 2 (two) times daily.      Multiple Vitamins-Minerals (MULTIVITAMINS THER. W/MINERALS) TABS Take 1 tablet by mouth daily.           Disposition and Follow-up: The patient is being discharged home and to followup with her primary care physician in 2 weeks.  Consults: None  Significant Diagnostic Studies:  Dg Abd Acute W/chest  10/24/2011  *RADIOLOGY REPORT*  Clinical Data: Distended abdomen, constipation  ACUTE ABDOMEN SERIES (ABDOMEN 2 VIEW & CHEST 1 VIEW)  Comparison: Abdomen film of 09/09/2011 and chest with acute abdomen of 09/07/2011  Findings: The lungs are clear.  The heart is within  normal limits in size.  No bony abnormality is seen.  Considerable gaseous distention primarily of the colon is unchanged.  There is very little air within the rectosigmoid colon region.  This pattern appears similar to previous plain films and CT from April 2012. No free air is seen.  This may represent pseudo obstruction, although as noted on the prior CT, a rectal lesion cannot be excluded.  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  IMPRESSION:  1.  No significant change in gaseous distention of primarily the colon.  Possible pseudo-obstruction.  Cannot exclude rectal lesion. 2.  No free air. 3.  No active lung disease.  Original Report Authenticated By: [name], M.D.    Discharge Laboratory Values: CBC    Component Value Date/Time   WBC 9.7 10/24/2011 1106   RBC 4.47 10/24/2011 1106   HGB 12.7 10/24/2011 1106   HCT 39.2 10/24/2011 1106   PLT 355 10/24/2011 1106   MCV 87.7 10/24/2011 1106   MCH 28.4 10/24/2011 1106   MCHC 32.4 10/24/2011 1106   RDW 13.8 10/24/2011 1106   LYMPHSABS 1.7 10/24/2011 1106   MONOABS 0.5 10/24/2011 1106   EOSABS 0.0 10/24/2011 1106   BASOSABS 0.0 10/24/2011 1106    BMET    Component Value Date/Time   NA 139 10/24/2011 1106   K 3.8 10/24/2011 1106   CL 102 10/24/2011 1106   CO2 26 10/24/2011 1106   GLUCOSE 106* 10/24/2011 1106   BUN 8 10/24/2011  1106   CREATININE 0.62 10/24/2011 1106   CALCIUM 10.1 10/24/2011 1106   GFRNONAA >90 10/24/2011 1106   GFRAA >90 10/24/2011 1106      Brief H and P: For complete details please refer to admission H and P, but in brief, Ms. [name] is a 60 year old female who presented to the hospital with abdominal distention and pain in the setting of chronic colonic ileus. She has had similar admissions to the hospital to address the same issue. Upon evaluation in the emergency department, the patient had signs of obstipation and a large amount of stool was noted on films of her abdomen. She  subsequently was referred to the hospitalist service for further evaluation and treatment.  Physical Exam at Discharge: BP 152/78  Pulse 84  Temp(Src) 98.5 F (36.9 C) (Oral)  Resp 17  Ht 5\' 1"  (1.549 m)  Wt 52 kg (114 lb 10.2 oz)  BMI 21.66 kg/m2  SpO2 99% General: No acute distress Cardiovascular: Heart sounds regular. No murmurs, rubs, or gallops. Respiratory: Lungs clear to auscultation bilaterally with good air movement. GI: Abdomen much softer today. Bowel sounds present in all 4 quadrants. Extremities: No clubbing, edema, or cyanosis.    Hospital Course:  Principal Problem:  *Obstipation Patient was given fleets enemas on initial presentation with good results. Despite these results, the patient continued to have abdominal bloating and a firm abdomen on exam. Her Reglan was restarted and she was put on a bowel regimen as noted above. On the morning of discharge, the patient's abdominal exam showed marked improvement and she was moving her bowels. Active Problems:  DM type 2 (diabetes mellitus, type 2) Patient will continue on her preadmission medications.  HTN (hypertension) Continue preadmission medications.   Recommendations for hospital follow-up: 1. Followup with primary care physician to determine effectiveness of bowel regimen. Consider increase in Reglan dose if symptoms are not adequately controlled.  Time spent on Discharge: 45 minutes  Signed: Dr. Trula Ore [name] Pager [phone] 10/27/2011, 1:06 PM

## 2011-12-09 ENCOUNTER — Other Ambulatory Visit: Payer: Self-pay

## 2011-12-09 ENCOUNTER — Inpatient Hospital Stay (HOSPITAL_COMMUNITY)
Admission: EM | Admit: 2011-12-09 | Discharge: 2011-12-17 | DRG: 689 | Disposition: A | Discharge status: Home / Self Care | Payer: Medicare Other | Attending: Internal Medicine | Admitting: Internal Medicine

## 2011-12-09 ENCOUNTER — Emergency Department (HOSPITAL_COMMUNITY): Payer: Medicare Other

## 2011-12-09 ENCOUNTER — Encounter (HOSPITAL_COMMUNITY): Payer: Self-pay | Admitting: *Deleted

## 2011-12-09 DIAGNOSIS — K5909 Other constipation: Secondary | ICD-10-CM

## 2011-12-09 DIAGNOSIS — G929 Unspecified toxic encephalopathy: Secondary | ICD-10-CM | POA: Diagnosis present

## 2011-12-09 DIAGNOSIS — G9340 Encephalopathy, unspecified: Secondary | ICD-10-CM | POA: Diagnosis present

## 2011-12-09 DIAGNOSIS — F79 Unspecified intellectual disabilities: Secondary | ICD-10-CM | POA: Diagnosis present

## 2011-12-09 DIAGNOSIS — I674 Hypertensive encephalopathy: Secondary | ICD-10-CM | POA: Diagnosis present

## 2011-12-09 DIAGNOSIS — E119 Type 2 diabetes mellitus without complications: Secondary | ICD-10-CM | POA: Diagnosis present

## 2011-12-09 DIAGNOSIS — R4182 Altered mental status, unspecified: Secondary | ICD-10-CM

## 2011-12-09 DIAGNOSIS — IMO0002 Reserved for concepts with insufficient information to code with codable children: Secondary | ICD-10-CM | POA: Diagnosis not present

## 2011-12-09 DIAGNOSIS — N390 Urinary tract infection, site not specified: Principal | ICD-10-CM

## 2011-12-09 DIAGNOSIS — E876 Hypokalemia: Secondary | ICD-10-CM | POA: Diagnosis present

## 2011-12-09 DIAGNOSIS — K560 Paralytic ileus: Secondary | ICD-10-CM | POA: Diagnosis present

## 2011-12-09 DIAGNOSIS — E1165 Type 2 diabetes mellitus with hyperglycemia: Secondary | ICD-10-CM | POA: Diagnosis not present

## 2011-12-09 DIAGNOSIS — I10 Essential (primary) hypertension: Secondary | ICD-10-CM

## 2011-12-09 DIAGNOSIS — G92 Toxic encephalopathy: Secondary | ICD-10-CM | POA: Diagnosis present

## 2011-12-09 DIAGNOSIS — K567 Ileus, unspecified: Secondary | ICD-10-CM | POA: Diagnosis present

## 2011-12-09 DIAGNOSIS — K5900 Constipation, unspecified: Secondary | ICD-10-CM

## 2011-12-09 LAB — BASIC METABOLIC PANEL
BUN: 11 mg/dL (ref 6–23)
Calcium: 10.2 mg/dL (ref 8.4–10.5)
Creatinine, Ser: 0.61 mg/dL (ref 0.50–1.10)
GFR calc Af Amer: >90 mL/min (ref >90)
GFR calc non Af Amer: >90 mL/min (ref >90)
Potassium: 3.1 mEq/L — ABNORMAL LOW (ref 3.5–5.1)

## 2011-12-09 LAB — CBC
HCT: 39.9 % (ref 36.0–46.0)
Hemoglobin: 13.1 g/dL (ref 12.0–15.0)
MCH: 28.1 pg (ref 26.0–34.0)
MCHC: 32.8 g/dL (ref 30.0–36.0)
MCV: 85.6 fL (ref 78.0–100.0)
Platelets: 324 K/uL (ref 150–400)
RBC: 4.66 MIL/uL (ref 3.87–5.11)
RDW: 14.6 % (ref 11.5–15.5)
WBC: 10.5 K/uL (ref 4.0–10.5)

## 2011-12-09 LAB — URINE MICROSCOPIC-ADD ON

## 2011-12-09 LAB — POCT I-STAT TROPONIN I: Troponin i, poc: 0 ng/mL (ref 0.00–0.08)

## 2011-12-09 LAB — DIFFERENTIAL
Basophils Absolute: 0 K/uL (ref 0.0–0.1)
Basophils Relative: 0 % (ref 0–1)
Eosinophils Absolute: 0.1 10*3/uL (ref 0.0–0.7)
Eosinophils Relative: 1 % (ref 0–5)
Lymphocytes Relative: 29 % (ref 12–46)
Lymphs Abs: 3 K/uL (ref 0.7–4.0)
Monocytes Absolute: 0.6 K/uL (ref 0.1–1.0)
Monocytes Relative: 5 % (ref 3–12)
Neutro Abs: 6.8 K/uL (ref 1.7–7.7)
Neutrophils Relative %: 65 % (ref 43–77)

## 2011-12-09 LAB — LACTIC ACID, PLASMA: Lactic Acid, Venous: 1.6 mmol/L (ref 0.5–2.2)

## 2011-12-09 LAB — URINALYSIS, ROUTINE W REFLEX MICROSCOPIC
Bilirubin Urine: NEGATIVE
Glucose, UA: 100 mg/dL — AB
Hgb urine dipstick: NEGATIVE
Ketones, ur: NEGATIVE mg/dL
Nitrite: NEGATIVE
Protein, ur: 100 mg/dL — AB
Specific Gravity, Urine: 1.028 (ref 1.005–1.030)
Urobilinogen, UA: 1 mg/dL (ref 0.0–1.0)
pH: 6 (ref 5.0–8.0)

## 2011-12-09 LAB — BASIC METABOLIC PANEL WITH GFR
CO2: 28 meq/L (ref 19–32)
Chloride: 103 meq/L (ref 96–112)
Glucose, Bld: 92 mg/dL (ref 70–99)
Sodium: 141 meq/L (ref 135–145)

## 2011-12-09 MED ORDER — INSULIN ASPART 100 UNIT/ML ~~LOC~~ SOLN
0.0000 [IU] | Freq: Three times a day (TID) | SUBCUTANEOUS | Status: DC
  Administered 2011-12-14 – 2011-12-16 (×2): 2 [IU] via SUBCUTANEOUS
  Filled 2011-12-09: qty 3

## 2011-12-09 MED ORDER — DEXTROSE 5 % IV SOLN
1.0000 g | Freq: Once | INTRAVENOUS | Status: AC
  Administered 2011-12-09: 1 g via INTRAVENOUS
  Filled 2011-12-09: qty 10

## 2011-12-09 MED ORDER — INSULIN ASPART 100 UNIT/ML ~~LOC~~ SOLN: 0.0000 [IU] | Freq: Every day | SUBCUTANEOUS | Status: DC

## 2011-12-09 MED ORDER — LORAZEPAM 2 MG/ML IJ SOLN
0.5000 mg | Freq: Once | INTRAMUSCULAR | Status: AC
  Administered 2011-12-09: 0.5 mg via INTRAVENOUS
  Filled 2011-12-09: qty 1

## 2011-12-09 MED ORDER — SODIUM CHLORIDE 0.9 % IV SOLN
Freq: Once | INTRAVENOUS | Status: AC
  Administered 2011-12-09: 16:00:00 via INTRAVENOUS

## 2011-12-09 NOTE — H&P (Signed)
PCP:   Paulino Rily, MD   Chief Complaint:  confusion  HPI: This is a 60 year old female past medical history of hypertension, diabetes, mental retardation and chronic colonic ileus. Patient has had multiple admissions in the past few months for recurrent ileus and abdominal distention. She lives at home with her sister. Her sister is her main caregiver. According to her sister the patient was found to be confused this morning. She was found picking at things on the carpet. Her sister asked her to bring a piece of paper and the patient brought her a glass of juice. She does not report noticing any motor deficits. Regarding her speech her sister reports that the patient has been mumbling to herself for the past few months. At baseline the patient does not speak much. The patient contributes minimally to the history. Her sister also reports that the patient has not had a bowel movement for the past 4-5 days. Her by mouth intake has been poor.  In the ER she was evaluated and was found to be significantly hypertensive with blood pressures recorded at 238/125. This has improved to 180/93. Patient has been referred for admission.  Allergies:  No Known Allergies    Past Medical History  Diagnosis Date  . Hypertension   . Stroke   . Diabetes mellitus     History reviewed. No pertinent past surgical history.  Prior to Admission medications   Medication Sig Start Date End Date Taking? Authorizing Provider  bisacodyl (FLEET) 10 MG/30ML ENEM Place 10 mg rectally once. Taking once every 3 days   Yes Historical Provider, MD  carvedilol (COREG) 12.5 MG tablet Take 12.5 mg by mouth 2 (two) times daily with a meal.     Yes Historical Provider, MD  insulin glargine (LANTUS) 100 UNIT/ML injection Inject 20 Units into the skin every morning.     Yes Historical Provider, MD  lisinopril-hydrochlorothiazide (PRINZIDE,ZESTORETIC) 20-12.5 MG per tablet Take 1 tablet by mouth 2 (two) times daily.     Yes  Historical Provider, MD  Multiple Vitamins-Minerals (MULTIVITAMINS THER. W/MINERALS) TABS Take 1 tablet by mouth daily.     Yes Historical Provider, MD  senna-docusate (SENOKOT-S) 8.6-50 MG per tablet Take 1 tablet by mouth 2 (two) times daily. 10/27/11 10/26/12 Yes Christina Rama    Social History:  reports that she has never smoked. She has never used smokeless tobacco. She reports that she does not drink alcohol or use illicit drugs.  History reviewed. No pertinent family history.  Review of Systems: per history from sister, positives in bold Constitutional: Denies fever, chills, diaphoresis, appetite change and fatigue.  HEENT: Denies photophobia, eye pain, redness, hearing loss, ear pain, congestion, sore throat, rhinorrhea, sneezing, mouth sores, trouble swallowing, neck pain, neck stiffness and tinnitus.   Respiratory: Denies SOB, DOE, cough, chest tightness,  and wheezing.   Cardiovascular: Denies chest pain, palpitations and leg swelling.  Gastrointestinal: Denies nausea, vomiting, abdominal pain, diarrhea, constipation, blood in stool and abdominal distention.  Genitourinary: Denies dysuria, urgency, frequency, hematuria, flank pain and difficulty urinating.  Musculoskeletal: Denies myalgias, back pain, joint swelling, arthralgias and gait problem.  Skin: Denies pallor, rash and wound.  Neurological: Denies dizziness, seizures, syncope, weakness, light-headedness, numbness and headaches.  Hematological: Denies adenopathy. Easy bruising, personal or family bleeding history  Psychiatric/Behavioral: Denies suicidal ideation, mood changes, confusion, nervousness, sleep disturbance and agitation   Physical Exam: Blood pressure 180/93, pulse 86, temperature 99.2 F (37.3 C), temperature source Rectal, resp. rate 19, SpO2 94.00%. General:  Patient in no acute distress, lying in bed, does not readily answer questions, awake, alert HEENT: Normocephalic, atraumatic, pupils are equal react  to light Neck: Supple Chest: Clear to auscultation bilaterally Cardiac: S1, S2, regular rate and rhythm Abdomen: Distended, hypoactive bowel sounds, mildly tender diffusely Extremities: No edema, cyanosis Neurologic: The patient is moving all her extremities equally, she has difficulty cooperating with neurologic exam due to her mental status, no facial asymmetry  Labs on Admission:  Results for orders placed during the hospital encounter of 12/09/11 (from the past 48 hour(s))  LACTIC ACID, PLASMA     Status: Normal   Collection Time   12/09/11  3:48 PM      Component Value Range Comment   Lactic Acid, Venous 1.6  0.5 - 2.2 (mmol/L)   CBC     Status: Normal   Collection Time   12/09/11  4:00 PM      Component Value Range Comment   WBC 10.5  4.0 - 10.5 (K/uL)    RBC 4.66  3.87 - 5.11 (MIL/uL)    Hemoglobin 13.1  12.0 - 15.0 (g/dL)    HCT 62.9  52.8 - 41.3 (%)    MCV 85.6  78.0 - 100.0 (fL)    MCH 28.1  26.0 - 34.0 (pg)    MCHC 32.8  30.0 - 36.0 (g/dL)    RDW 24.4  01.0 - 27.2 (%)    Platelets 324  150 - 400 (K/uL)   DIFFERENTIAL     Status: Normal   Collection Time   12/09/11  4:00 PM      Component Value Range Comment   Neutrophils Relative 65  43 - 77 (%)    Neutro Abs 6.8  1.7 - 7.7 (K/uL)    Lymphocytes Relative 29  12 - 46 (%)    Lymphs Abs 3.0  0.7 - 4.0 (K/uL)    Monocytes Relative 5  3 - 12 (%)    Monocytes Absolute 0.6  0.1 - 1.0 (K/uL)    Eosinophils Relative 1  0 - 5 (%)    Eosinophils Absolute 0.1  0.0 - 0.7 (K/uL)    Basophils Relative 0  0 - 1 (%)    Basophils Absolute 0.0  0.0 - 0.1 (K/uL)   BASIC METABOLIC PANEL     Status: Abnormal   Collection Time   12/09/11  4:00 PM      Component Value Range Comment   Sodium 141  135 - 145 (mEq/L)    Potassium 3.1 (*) 3.5 - 5.1 (mEq/L)    Chloride 103  96 - 112 (mEq/L)    CO2 28  19 - 32 (mEq/L)    Glucose, Bld 92  70 - 99 (mg/dL)    BUN 11  6 - 23 (mg/dL)    Creatinine, Ser 5.36  0.50 - 1.10 (mg/dL)    Calcium  64.4  8.4 - 10.5 (mg/dL)    GFR calc non Af Amer >90  >90 (mL/min)    GFR calc Af Amer >90  >90 (mL/min)   POCT I-STAT TROPONIN I     Status: Normal   Collection Time   12/09/11  4:07 PM      Component Value Range Comment   Troponin i, poc 0.00  0.00 - 0.08 (ng/mL)    Comment 3            URINALYSIS, ROUTINE W REFLEX MICROSCOPIC     Status: Abnormal   Collection Time   12/09/11  4:11 PM      Component Value Range Comment   Color, Urine AMBER (*) YELLOW  BIOCHEMICALS MAY BE AFFECTED BY COLOR   APPearance CLOUDY (*) CLEAR     Specific Gravity, Urine 1.028  1.005 - 1.030     pH 6.0  5.0 - 8.0     Glucose, UA 100 (*) NEGATIVE (mg/dL)    Hgb urine dipstick NEGATIVE  NEGATIVE     Bilirubin Urine NEGATIVE  NEGATIVE     Ketones, ur NEGATIVE  NEGATIVE (mg/dL)    Protein, ur 528 (*) NEGATIVE (mg/dL)    Urobilinogen, UA 1.0  0.0 - 1.0 (mg/dL)    Nitrite NEGATIVE  NEGATIVE     Leukocytes, UA LARGE (*) NEGATIVE    URINE MICROSCOPIC-ADD ON     Status: Abnormal   Collection Time   12/09/11  4:11 PM      Component Value Range Comment   Squamous Epithelial / LPF RARE  RARE     WBC, UA TOO NUMEROUS TO COUNT  <3 (WBC/hpf)    Bacteria, UA MANY (*) RARE     Crystals CA OXALATE CRYSTALS (*) NEGATIVE      Radiological Exams on Admission: Ct Head Wo Contrast  12/09/2011  *RADIOLOGY REPORT*  Clinical Data: Acute mental status changes with increased confusion.  History of hypertension and diabetes.  CT HEAD WITHOUT CONTRAST 12/09/2011:  Technique:  Contiguous axial images were obtained from the base of the skull through the vertex without contrast.  Comparison: None.  Findings: Ventricular system normal in size and appearance for age. Severe changes of small vessel disease of the white matter diffusely, including the brainstem and cerebellum.  Physiologic calcifications in the basal ganglia.  No mass lesion.  No midline shift.  No acute hemorrhage or hematoma.  No extra-axial fluid collections.  No  evidence of acute infarction.  No focal osseous abnormality involving the skull.  Visualized paranasal sinuses, mastoid air cells, and middle ear cavities well- aerated.  Bilateral carotid siphon atherosclerosis.  IMPRESSION:  1.  No acute intracranial abnormality. 2.  Severe chronic microvascular ischemic changes of the white matter diffusely, including the brainstem and cerebellum.  Original Report Authenticated By: Arnell Sieving, M.D.   Dg Abd Acute W/chest  12/09/2011  *RADIOLOGY REPORT*  Clinical Data: Abdominal pain and distention.  Coumadin status changes.  ACUTE ABDOMEN SERIES (ABDOMEN 2 VIEW & CHEST 1 VIEW) 12/09/2011:  Comparison: Acute abdomen series 10/24/2011, 09/07/2011, 04/07/2011.  CT abdomen pelvis 04/07/2011.  All prior imaging [facility].  Findings: Marked gaseous distention of the colon, especially the sigmoid colon, similar to the prior examinations.  Air-fluid levels consistent with liquid stool on the lateral decubitus image.  No evidence of free intraperitoneal air.  No significant small bowel gas.  No visible opaque urinary tract calculi.  Degenerative changes involving the lower lumbar spine and severe degenerative changes in the right hip.  Suboptimal inspiration accounts for atelectasis in the lung bases, right greater than left.  Lungs otherwise clear.  Cardiac silhouette upper normal in size to slightly enlarged but stable. Pulmonary vascularity normal.  IMPRESSION:  1.  Recurrent severe diffuse colonic ileus, with marked enlargement of the sigmoid colon. 2.  No evidence of bowel obstruction or free intraperitoneal air. 3.  Suboptimal inspiration accounts for bibasilar atelectasis, right greater than left.  Stable borderline to mild cardiomegaly without pulmonary edema.  Original Report Authenticated By: Arnell Sieving, M.D.    Assessment/Plan Principal Problem:  *Encephalopathy Active  Problems:  DM type 2 (diabetes mellitus, type 2)  HTN (hypertension)   Ileus  Hypokalemia  UTI (lower urinary tract infection)  Plan:  The cause of the patient's encephalopathy could be related to underlying urinary tract infection for versus her uncontrolled hypertension. Her sister reports her blood pressures normally controlled at home and is usually lower than 150 systolic. CT head is negative for any acute findings. She does not appear to have any focal motor deficits. Her sister agrees that undergoing an MRI would likely be difficult for the patient due to her baseline mental status. At this time we will start her on Rocephin and followup urine cultures. We will also continue her outpatient antihypertensive regimen, except increase the Coreg to 25 mg twice a day. Will monitor her mental status, and if there is no significant improvement, then an MRI of the brain may be considered at that time.  For her chronic ileus we will continue her on MiraLAX, start Reglan, and give her fleets enemas. We will start her on clear liquids and this can be advanced as tolerated.  Her potassium will be repleted and we will check magnesium  Will continue her on sliding scale insulin as well as Lantus.  The patient is a full code, further orders per the clinical course  Time Spent on Admission:  Dariella Gillihan [facility] 12/09/2011, 10:29 PM

## 2011-12-09 NOTE — ED Provider Notes (Signed)
History     CSN: [id number]  Arrival date & time 12/09/11  1503   First MD Initiated Contact with Patient 12/09/11 1526      Chief Complaint  Patient presents with  . Hypertension  . Altered Mental Status    (Consider location/radiation/quality/duration/timing/severity/associated sxs/prior treatment) HPI Comments: Level 5 caveat due to patient with MR and pt's sister who is caregiver is distraught.  Pt lives with sister.  Sister reports vague change in behavior noted yesterday, but early this AM, sister woke due to noise at 0500 and found pt in bathroom confused, picking at carpet, walking in a circle as if looking for something.  Sister asked pt to bring her some tissue and pt got something else and brought that to her and went back to her room and continued to pick at the carpet in her room.  In addition, pt has chronic constipation and despite taking laxatives and usual medications, no BM in about 3 days which is a long time for pt.  Sister tries to make sure not to go past 4 days as instructed to her by her doctors.  Did not have influenza vaccine.  But no fevers, coughing, cold symptoms, no N/V/D.  No rash.  Pt had not complained of HA.  She complains of abd pain which pt often will do with constipation.  She has h/o DM and HTN and sister reports compliance with all meds.  She tried to give pt a enema yesterday but patient fought against her and couldn't accomplish this.    Patient is a 60 y.o. female presenting with hypertension and altered mental status. The history is provided by the patient, a relative and medical records. The history is limited by a developmental delay.  Hypertension  Altered Mental Status    Past Medical History  Diagnosis Date  . Hypertension   . Stroke   . Diabetes mellitus     History reviewed. No pertinent past surgical history.  History reviewed. No pertinent family history.  History  Substance Use Topics  . Smoking status: Never Smoker   .  Smokeless tobacco: Never Used  . Alcohol Use: No    OB History    Grav Para Term Preterm Abortions TAB SAB Ect Mult Living                  Review of Systems  Unable to perform ROS: Mental status change  Psychiatric/Behavioral: Positive for altered mental status.    Allergies  Review of patient's allergies indicates no known allergies.  Home Medications   Current Outpatient Rx  Name Route Sig Dispense Refill  . BISACODYL 10 MG/30ML RE ENEM Rectal Place 10 mg rectally once. Taking once every 3 days    . CARVEDILOL 12.5 MG PO TABS Oral Take 12.5 mg by mouth 2 (two) times daily with a meal.      . INSULIN GLARGINE 100 UNIT/ML [place] SOLN Subcutaneous Inject 20 Units into the skin every morning.      Marland Kitchen LISINOPRIL-HYDROCHLOROTHIAZIDE 20-12.5 MG PO TABS Oral Take 1 tablet by mouth 2 (two) times daily.      Carma Leaven M PLUS PO TABS Oral Take 1 tablet by mouth daily.      Bernadette Hoit SODIUM 8.6-50 MG PO TABS Oral Take 1 tablet by mouth 2 (two) times daily. 60 tablet 9    BP 187/90  Pulse 98  Resp 17  SpO2 100%  Physical Exam  Nursing note and vitals reviewed. Constitutional: She appears  well-developed and well-nourished.  HENT:  Head: Normocephalic and atraumatic.  Eyes: Pupils are equal, round, and reactive to light. Right conjunctiva is not injected. Left conjunctiva is not injected.  Neck: Neck supple.  Cardiovascular: Regular rhythm and normal pulses.  Tachycardia present.   Pulses:      Radial pulses are 2+ on the right side, and 2+ on the left side.       Dorsalis pedis pulses are 2+ on the right side, and 2+ on the left side.  Pulmonary/Chest: Effort normal. No respiratory distress.  Abdominal: She exhibits distension. Bowel sounds are decreased. There is tenderness. There is no rebound and no CVA tenderness.  Musculoskeletal: She exhibits no tenderness.  Neurological: She is alert. No cranial nerve deficit. She exhibits normal muscle tone.  Skin: Skin is warm and  dry. No rash noted.    ED Course  Procedures (including critical care time)  Labs Reviewed  URINALYSIS, ROUTINE W REFLEX MICROSCOPIC - Abnormal; Notable for the following:    Color, Urine AMBER (*) BIOCHEMICALS MAY BE AFFECTED BY COLOR   APPearance CLOUDY (*)    Glucose, UA 100 (*)    Protein, ur 100 (*)    Leukocytes, UA LARGE (*)    All other components within normal limits  URINE MICROSCOPIC-ADD ON - Abnormal; Notable for the following:    Bacteria, UA MANY (*)    Crystals CA OXALATE CRYSTALS (*)    All other components within normal limits  CBC  DIFFERENTIAL  POCT I-STAT TROPONIN I  BASIC METABOLIC PANEL  I-STAT TROPONIN I  LACTIC ACID, PLASMA  URINE CULTURE   Ct Head Wo Contrast  12/09/2011  *RADIOLOGY REPORT*  Clinical Data: Acute mental status changes with increased confusion.  History of hypertension and diabetes.  CT HEAD WITHOUT CONTRAST 12/09/2011:  Technique:  Contiguous axial images were obtained from the base of the skull through the vertex without contrast.  Comparison: None.  Findings: Ventricular system normal in size and appearance for age. Severe changes of small vessel disease of the white matter diffusely, including the brainstem and cerebellum.  Physiologic calcifications in the basal ganglia.  No mass lesion.  No midline shift.  No acute hemorrhage or hematoma.  No extra-axial fluid collections.  No evidence of acute infarction.  No focal osseous abnormality involving the skull.  Visualized paranasal sinuses, mastoid air cells, and middle ear cavities well- aerated.  Bilateral carotid siphon atherosclerosis.  IMPRESSION:  1.  No acute intracranial abnormality. 2.  Severe chronic microvascular ischemic changes of the white matter diffusely, including the brainstem and cerebellum.  Original Report Authenticated By: Arnell Sieving, M.D.   Dg Abd Acute W/chest  12/09/2011  *RADIOLOGY REPORT*  Clinical Data: Abdominal pain and distention.  Coumadin status  changes.  ACUTE ABDOMEN SERIES (ABDOMEN 2 VIEW & CHEST 1 VIEW) 12/09/2011:  Comparison: Acute abdomen series 10/24/2011, 09/07/2011, 04/07/2011.  CT abdomen pelvis 04/07/2011.  All prior imaging [facility].  Findings: Marked gaseous distention of the colon, especially the sigmoid colon, similar to the prior examinations.  Air-fluid levels consistent with liquid stool on the lateral decubitus image.  No evidence of free intraperitoneal air.  No significant small bowel gas.  No visible opaque urinary tract calculi.  Degenerative changes involving the lower lumbar spine and severe degenerative changes in the right hip.  Suboptimal inspiration accounts for atelectasis in the lung bases, right greater than left.  Lungs otherwise clear.  Cardiac silhouette upper normal in size to slightly enlarged  but stable. Pulmonary vascularity normal.  IMPRESSION:  1.  Recurrent severe diffuse colonic ileus, with marked enlargement of the sigmoid colon. 2.  No evidence of bowel obstruction or free intraperitoneal air. 3.  Suboptimal inspiration accounts for bibasilar atelectasis, right greater than left.  Stable borderline to mild cardiomegaly without pulmonary edema.  Original Report Authenticated By: Arnell Sieving, M.D.     1. Urinary tract infection   2. Hypertension   3. Altered mental status   4. Chronic constipation     ECG at 16:12 shows NSR of rate 94, normal axis, inverted and slightly flat t waves inf and ant lateral leads.  Similar in appearance to ECG on 11/30/09.  MDM  I reviewed pt's recent admission for chronic constipation and obstipation.  She was admitted for DKA in 2010, first episode of DM discovered then.  Pt is HTN, some may be from anxiety of being here, pt is somewhat scared, but with degree of HTN and h/o CVA and TIA per sister, will get head CT.  Pt is moving both UE relatively equally but it is difficult to get patient to completely comply with all aspects of neuro exam.  No  facial droop.  Moves both LE's equally as well.    Will get labs, UA, head CT, plain films of abd.  Some ativan for her anxiety esp prior to head CT.  Will likely need admit.  Pt had some kind of CVA at age 50 which likely caused her current MR condition per prior records.       4:44 PM UTI found, could very well be cause of confusion.  BP's have been steadily improving, possibly due to ativan given here.  IV ceftriaxone ordered.  CT and films per radiologist shows no acute abn's.  I reviewed myself.    5:28 PM [facility] to see and admit.    Gavin Pound. Chari Parmenter, MD 12/09/11 1728

## 2011-12-09 NOTE — ED Notes (Signed)
Per Pt sister, pt last seen normal 10 days ago. Pt sister/caregiver sts that she has been more confused than normal.

## 2011-12-10 LAB — BASIC METABOLIC PANEL
BUN: 8 mg/dL (ref 6–23)
CO2: 27 mEq/L (ref 19–32)
Calcium: 9.2 mg/dL (ref 8.4–10.5)
Chloride: 105 mEq/L (ref 96–112)
Creatinine, Ser: 0.57 mg/dL (ref 0.50–1.10)

## 2011-12-10 LAB — CBC
HCT: 35.5 % — ABNORMAL LOW (ref 36.0–46.0)
MCHC: 32.7 g/dL (ref 30.0–36.0)
MCV: 86.2 fL (ref 78.0–100.0)
Platelets: 335 10*3/uL (ref 150–400)
RDW: 14.6 % (ref 11.5–15.5)

## 2011-12-10 LAB — URINE CULTURE
Colony Count: NO GROWTH
Culture  Setup Time: 201212310134
Culture: NO GROWTH

## 2011-12-10 LAB — GLUCOSE, CAPILLARY
Glucose-Capillary: 89 mg/dL (ref 70–99)
Glucose-Capillary: 102 mg/dL — ABNORMAL HIGH (ref 70–99)

## 2011-12-10 LAB — TSH: TSH: 0.671 u[IU]/mL (ref 0.350–4.500)

## 2011-12-10 MED ORDER — POTASSIUM CHLORIDE 10 MEQ/100ML IV SOLN
10.0000 meq | INTRAVENOUS | Status: AC
  Administered 2011-12-10 (×6): 10 meq via INTRAVENOUS
  Filled 2011-12-10 (×6): qty 100

## 2011-12-10 MED ORDER — DEXTROSE 5 % IV SOLN
1.0000 g | INTRAVENOUS | Status: DC
  Administered 2011-12-10 – 2011-12-15 (×6): 1 g via INTRAVENOUS
  Filled 2011-12-10 (×7): qty 10

## 2011-12-10 MED ORDER — METOCLOPRAMIDE HCL 5 MG/ML IJ SOLN
10.0000 mg | Freq: Four times a day (QID) | INTRAMUSCULAR | Status: DC
  Administered 2011-12-10 – 2011-12-16 (×27): 10 mg via INTRAVENOUS
  Filled 2011-12-10 (×34): qty 2

## 2011-12-10 MED ORDER — HYDROCHLOROTHIAZIDE 12.5 MG PO CAPS
12.5000 mg | ORAL_CAPSULE | Freq: Every day | ORAL | Status: DC
  Administered 2011-12-10: 12.5 mg via ORAL
  Filled 2011-12-10 (×2): qty 1

## 2011-12-10 MED ORDER — POLYETHYLENE GLYCOL 3350 17 G PO PACK
17.0000 g | PACK | Freq: Two times a day (BID) | ORAL | Status: DC
  Administered 2011-12-10 – 2011-12-17 (×13): 17 g via ORAL
  Filled 2011-12-10 (×18): qty 1

## 2011-12-10 MED ORDER — ONDANSETRON HCL 4 MG/2ML IJ SOLN
4.0000 mg | Freq: Four times a day (QID) | INTRAMUSCULAR | Status: DC | PRN
  Administered 2011-12-11: 4 mg via INTRAVENOUS
  Filled 2011-12-10: qty 2

## 2011-12-10 MED ORDER — POTASSIUM CHLORIDE IN NACL 20-0.45 MEQ/L-% IV SOLN
INTRAVENOUS | Status: DC
  Administered 2011-12-10 – 2011-12-11 (×2): via INTRAVENOUS
  Filled 2011-12-10 (×5): qty 1000

## 2011-12-10 MED ORDER — ACETAMINOPHEN 325 MG PO TABS
650.0000 mg | ORAL_TABLET | Freq: Four times a day (QID) | ORAL | Status: DC | PRN
  Administered 2011-12-13: 650 mg via ORAL
  Filled 2011-12-10: qty 2

## 2011-12-10 MED ORDER — CARVEDILOL 25 MG PO TABS
25.0000 mg | ORAL_TABLET | Freq: Two times a day (BID) | ORAL | Status: DC
  Administered 2011-12-10 – 2011-12-17 (×16): 25 mg via ORAL
  Filled 2011-12-10 (×19): qty 1

## 2011-12-10 MED ORDER — FLEET ENEMA 7-19 GM/118ML RE ENEM
1.0000 | ENEMA | Freq: Once | RECTAL | Status: DC
  Filled 2011-12-10: qty 1

## 2011-12-10 MED ORDER — POLYETHYLENE GLYCOL 3350 17 G PO PACK
17.0000 g | PACK | Freq: Once | ORAL | Status: DC
  Filled 2011-12-10: qty 1

## 2011-12-10 MED ORDER — BISACODYL 5 MG PO TBEC
10.0000 mg | DELAYED_RELEASE_TABLET | Freq: Two times a day (BID) | ORAL | Status: DC
Start: 2011-12-10 — End: 2011-12-17
  Administered 2011-12-10 – 2011-12-17 (×15): 10 mg via ORAL
  Filled 2011-12-10: qty 1
  Filled 2011-12-10: qty 2
  Filled 2011-12-10: qty 1
  Filled 2011-12-10 (×6): qty 2
  Filled 2011-12-10 (×2): qty 1
  Filled 2011-12-10 (×3): qty 2
  Filled 2011-12-10: qty 1
  Filled 2011-12-10: qty 2
  Filled 2011-12-10: qty 1
  Filled 2011-12-10: qty 2

## 2011-12-10 MED ORDER — LISINOPRIL 40 MG PO TABS
40.0000 mg | ORAL_TABLET | Freq: Every day | ORAL | Status: DC
  Administered 2011-12-11 – 2011-12-17 (×7): 40 mg via ORAL
  Filled 2011-12-10 (×9): qty 1

## 2011-12-10 MED ORDER — MORPHINE SULFATE 2 MG/ML IJ SOLN: 1.0000 mg | INTRAMUSCULAR | Status: DC | PRN

## 2011-12-10 MED ORDER — LISINOPRIL 20 MG PO TABS
20.0000 mg | ORAL_TABLET | Freq: Every day | ORAL | Status: DC
  Administered 2011-12-10: 20 mg via ORAL
  Filled 2011-12-10 (×2): qty 1

## 2011-12-10 MED ORDER — FLEET ENEMA 7-19 GM/118ML RE ENEM
1.0000 | ENEMA | Freq: Every day | RECTAL | Status: DC | PRN
  Administered 2011-12-10 – 2011-12-12 (×2): 1 via RECTAL
  Filled 2011-12-10: qty 1

## 2011-12-10 MED ORDER — ONDANSETRON HCL 4 MG PO TABS: 4.0000 mg | ORAL_TABLET | Freq: Four times a day (QID) | ORAL | Status: DC | PRN

## 2011-12-10 MED ORDER — LISINOPRIL-HYDROCHLOROTHIAZIDE 20-12.5 MG PO TABS: 1.0000 | ORAL_TABLET | Freq: Two times a day (BID) | ORAL | Status: DC

## 2011-12-10 MED ORDER — ENOXAPARIN SODIUM 40 MG/0.4ML ~~LOC~~ SOLN
40.0000 mg | SUBCUTANEOUS | Status: DC
  Administered 2011-12-10 – 2011-12-17 (×8): 40 mg via SUBCUTANEOUS
  Filled 2011-12-10 (×9): qty 0.4

## 2011-12-10 MED ORDER — THERA M PLUS PO TABS
1.0000 | ORAL_TABLET | Freq: Every day | ORAL | Status: DC
  Administered 2011-12-10 – 2011-12-17 (×7): 1 via ORAL
  Filled 2011-12-10 (×9): qty 1

## 2011-12-10 MED ORDER — HYDRALAZINE HCL 20 MG/ML IJ SOLN
10.0000 mg | Freq: Four times a day (QID) | INTRAMUSCULAR | Status: DC | PRN
  Administered 2011-12-11: 10 mg via INTRAVENOUS
  Filled 2011-12-10: qty 1

## 2011-12-10 MED ORDER — POTASSIUM CHLORIDE CRYS ER 20 MEQ PO TBCR
40.0000 meq | EXTENDED_RELEASE_TABLET | Freq: Two times a day (BID) | ORAL | Status: DC
  Administered 2011-12-11 – 2011-12-12 (×4): 40 meq via ORAL
  Filled 2011-12-10 (×6): qty 2

## 2011-12-10 MED ORDER — ACETAMINOPHEN 650 MG RE SUPP: 650.0000 mg | Freq: Four times a day (QID) | RECTAL | Status: DC | PRN

## 2011-12-10 MED ORDER — INSULIN GLARGINE 100 UNIT/ML ~~LOC~~ SOLN
20.0000 [IU] | SUBCUTANEOUS | Status: DC
  Administered 2011-12-10 – 2011-12-14 (×5): 20 [IU] via SUBCUTANEOUS
  Filled 2011-12-10: qty 3

## 2011-12-10 NOTE — Progress Notes (Signed)
Went in to assess pt IV due to beeping. Pt has pulled IV out of hand and bent the catheter. Contacted IV team to restart. Will continue runs of Potassium once new IV is placed. Pt has only received one run thus far. Mechele Collin

## 2011-12-10 NOTE — Progress Notes (Addendum)
Received report from [facility] in ED. Upon assessing pt on the unit she appears to be alert to self only. No skin issues, one assist to [facility], and appears to be developmentally delayed. Pt does not speak much and when she does it is slurred and unclear. Pt has been placed on telemetry and is currently in NSR. Having difficulty trying to flush her IV which is in her right hand. Will have IV team assess. VS are as follows: 161/94, 97.4, 87, 16 R, and 98% on RA- pt denies any pain. Otherwise, pt appears to be stable. Sister is at bedside. Mechele Collin

## 2011-12-10 NOTE — Progress Notes (Signed)
Subjective: No new issues today. Mental status appears at baseline. Patient would like to eat regular food.  Objective: Vital signs in last 24 hours: Temp:  [97.4 F (36.3 C)-99.2 F (37.3 C)] 97.5 F (36.4 C) (12/31 1358) Pulse Rate:  [80-87] 83  (12/31 1358) Resp:  [16-20] 18  (12/31 1358) BP: (146-185)/(62-94) 156/84 mmHg (12/31 1358) SpO2:  [94 %-100 %] 97 % (12/31 1358) Weight:  [50.168 kg (110 lb 9.6 oz)] 110 lb 9.6 oz (50.168 kg) (12/31 0246) Weight change:  Last BM Date: 12/06/11  Intake/Output from previous day: 12/30 0701 - 12/31 0700 In: 302 [P.O.:100; IV Piggyback:202] Out: -  Total I/O In: 240 [P.O.:240] Out: 650 [Urine:650]   Physical Exam: General: Comfortable, alert, follows simple instructions, not communicative, not short of breath at rest.  HEENT:  Mild clinical pallor, no jaundice, no conjunctival injection or discharge. Hydration status is ok. NECK:  Supple, JVP not seen, no carotid bruits, no palpable lymphadenopathy, no palpable goiter. CHEST:  Clinically clear to auscultation, no wheezes, no crackles. HEART:  Sounds 1 and 2 heard, normal, regular, no murmurs. ABDOMEN:  Full, softly distended, non-tender, no palpable organomegaly, no palpable masses, normal bowel sounds. GENITALIA:  Not examined. LOWER EXTREMITIES:  No pitting edema, palpable peripheral pulses. MUSCULOSKELETAL SYSTEM:  Unremarkable. CENTRAL NERVOUS SYSTEM:  No focal neurologic deficit on gross examination.  Lab Results:  Basename 12/10/11 0440 12/09/11 1600  WBC 11.9* 10.5  HGB 11.6* 13.1  HCT 35.5* 39.9  PLT 335 324    Basename 12/10/11 1206 12/10/11 0440 12/09/11 1600  NA -- 141 141  K 3.4* 2.7* --  CL -- 105 103  CO2 -- 27 28  GLUCOSE -- 87 92  BUN -- 8 11  CREATININE -- 0.57 0.61  CALCIUM -- 9.2 10.2   No results found for this or any previous visit (from the past 240 hour(s)).   Studies/Results: Ct Head Wo Contrast  12/09/2011  *RADIOLOGY REPORT*  Clinical  Data: Acute mental status changes with increased confusion.  History of hypertension and diabetes.  CT HEAD WITHOUT CONTRAST 12/09/2011:  Technique:  Contiguous axial images were obtained from the base of the skull through the vertex without contrast.  Comparison: None.  Findings: Ventricular system normal in size and appearance for age. Severe changes of small vessel disease of the white matter diffusely, including the brainstem and cerebellum.  Physiologic calcifications in the basal ganglia.  No mass lesion.  No midline shift.  No acute hemorrhage or hematoma.  No extra-axial fluid collections.  No evidence of acute infarction.  No focal osseous abnormality involving the skull.  Visualized paranasal sinuses, mastoid air cells, and middle ear cavities well- aerated.  Bilateral carotid siphon atherosclerosis.  IMPRESSION:  1.  No acute intracranial abnormality. 2.  Severe chronic microvascular ischemic changes of the white matter diffusely, including the brainstem and cerebellum.  Original Report Authenticated By: Arnell Sieving, M.D.   Dg Abd Acute W/chest  12/09/2011  *RADIOLOGY REPORT*  Clinical Data: Abdominal pain and distention.  Coumadin status changes.  ACUTE ABDOMEN SERIES (ABDOMEN 2 VIEW & CHEST 1 VIEW) 12/09/2011:  Comparison: Acute abdomen series 10/24/2011, 09/07/2011, 04/07/2011.  CT abdomen pelvis 04/07/2011.  All prior imaging [facility].  Findings: Marked gaseous distention of the colon, especially the sigmoid colon, similar to the prior examinations.  Air-fluid levels consistent with liquid stool on the lateral decubitus image.  No evidence of free intraperitoneal air.  No significant small bowel gas.  No visible  opaque urinary tract calculi.  Degenerative changes involving the lower lumbar spine and severe degenerative changes in the right hip.  Suboptimal inspiration accounts for atelectasis in the lung bases, right greater than left.  Lungs otherwise clear.  Cardiac silhouette  upper normal in size to slightly enlarged but stable. Pulmonary vascularity normal.  IMPRESSION:  1.  Recurrent severe diffuse colonic ileus, with marked enlargement of the sigmoid colon. 2.  No evidence of bowel obstruction or free intraperitoneal air. 3.  Suboptimal inspiration accounts for bibasilar atelectasis, right greater than left.  Stable borderline to mild cardiomegaly without pulmonary edema.  Original Report Authenticated By: Arnell Sieving, M.D.    Medications: Scheduled Meds:   . bisacodyl  10 mg Oral BID  . carvedilol  25 mg Oral BID WC  . cefTRIAXone (ROCEPHIN)  IV  1 g Intravenous Once  . cefTRIAXone (ROCEPHIN)  IV  1 g Intravenous Q24H  . enoxaparin  40 mg Subcutaneous Q24H  . hydrochlorothiazide  12.5 mg Oral Daily  . insulin aspart  0-15 Units Subcutaneous TID WC  . insulin aspart  0-5 Units Subcutaneous QHS  . insulin glargine  20 Units Subcutaneous Q24H  . lisinopril  20 mg Oral Daily  . metoCLOPramide (REGLAN) injection  10 mg Intravenous Q6H  . multivitamins ther. w/minerals  1 tablet Oral Daily  . polyethylene glycol  17 g Oral BID  . polyethylene glycol  17 g Oral Once  . potassium chloride  10 mEq Intravenous Q1 Hr x 6  . sodium phosphate  1 enema Rectal Once  . DISCONTD: lisinopril-hydrochlorothiazide  1 tablet Oral BID   Continuous Infusions:   . 0.45 % NaCl with KCl 20 mEq / L 75 mL/hr at 12/10/11 0450   PRN Meds:.acetaminophen, acetaminophen, hydrALAZINE, morphine, ondansetron (ZOFRAN) IV, ondansetron  Assessment/Plan:  Principal Problem:  *Encephalopathy: Toxic, secondary to UTI, although a component of hypertensive encephalopathy is possible. Mental status is now back to baseline.  Active Problems:  1. DM type 2 (diabetes mellitus, type 2): Reasonably controlled on SSI and Lantus.  2. HTN (hypertension): Improved control, but sub-optimal. Will change Lisinopril to 40 mg daily.  Ileus  3. Hypokalemia: Repleting as indicated.  4. UTI (lower  urinary tract infection): On Rocephin, day#2. Wcc is mildly elevated, no pyrexia has been recorded. Will continue, and await culture report.  Comment: Will change diet to carbohydrate-modified, regular.  LOS: 1 day   Douglas Smolinsky,CHRISTOPHER 12/10/2011, 4:31 PM

## 2011-12-10 NOTE — Progress Notes (Addendum)
CRITICAL VALUE ALERT  Critical value received:  K 2.7  Date of notification: 12/10/11  Time of notification: 0635  Critical value read back: Yes  Nurse who received alert: Cassell Smiles, RN  MD notified (1st page):  Benedetto Coons, PA  Time of first page: 0636  MD notified (2nd page):  Time of second page:  Responding MD: Benedetto Coons, PA  Time MD responded: [phone]

## 2011-12-11 ENCOUNTER — Inpatient Hospital Stay (HOSPITAL_COMMUNITY): Payer: Medicare Other

## 2011-12-11 LAB — BASIC METABOLIC PANEL
BUN: 10 mg/dL (ref 6–23)
BUN: 9 mg/dL (ref 6–23)
CO2: 25 mEq/L (ref 19–32)
CO2: 25 mEq/L (ref 19–32)
CO2: 26 mEq/L (ref 19–32)
Calcium: 8.8 mg/dL (ref 8.4–10.5)
Calcium: 8.8 mg/dL (ref 8.4–10.5)
Chloride: 104 mEq/L (ref 96–112)
Creatinine, Ser: 0.57 mg/dL (ref 0.50–1.10)
GFR calc Af Amer: >90 mL/min (ref >90)
GFR calc non Af Amer: >90 mL/min (ref >90)
Glucose, Bld: 87 mg/dL (ref 70–99)
Glucose, Bld: 61 mg/dL — ABNORMAL LOW (ref 70–99)
Potassium: 2.1 mEq/L — CL (ref 3.5–5.1)
Potassium: 2.1 mEq/L — CL (ref 3.5–5.1)
Sodium: 142 mEq/L (ref 135–145)

## 2011-12-11 LAB — CBC
HCT: 33.1 % — ABNORMAL LOW (ref 36.0–46.0)
Hemoglobin: 11.2 g/dL — ABNORMAL LOW (ref 12.0–15.0)
MCH: 28.8 pg (ref 26.0–34.0)
MCHC: 33.8 g/dL (ref 30.0–36.0)
MCV: 85.1 fL (ref 78.0–100.0)
RBC: 3.89 MIL/uL (ref 3.87–5.11)

## 2011-12-11 LAB — GLUCOSE, CAPILLARY
Glucose-Capillary: 73 mg/dL (ref 70–99)
Glucose-Capillary: 55 mg/dL — ABNORMAL LOW (ref 70–99)
Glucose-Capillary: 156 mg/dL — ABNORMAL HIGH (ref 70–99)
Glucose-Capillary: 44 mg/dL — CL (ref 70–99)
Glucose-Capillary: 175 mg/dL — ABNORMAL HIGH (ref 70–99)

## 2011-12-11 LAB — URINE CULTURE
Colony Count: NO GROWTH
Culture  Setup Time: 201301010117
Culture: NO GROWTH

## 2011-12-11 MED ORDER — POTASSIUM CHLORIDE CRYS ER 20 MEQ PO TBCR: 60.0000 meq | EXTENDED_RELEASE_TABLET | Freq: Once | ORAL | Status: DC

## 2011-12-11 MED ORDER — POTASSIUM CHLORIDE CRYS ER 20 MEQ PO TBCR
40.0000 meq | EXTENDED_RELEASE_TABLET | Freq: Once | ORAL | Status: AC
  Administered 2011-12-11: 40 meq via ORAL
  Filled 2011-12-11: qty 2

## 2011-12-11 MED ORDER — POTASSIUM CL IN DEXTROSE 5% 20 MEQ/L IV SOLN
20.0000 meq | INTRAVENOUS | Status: DC
  Administered 2011-12-12 – 2011-12-13 (×3): 20 meq via INTRAVENOUS
  Filled 2011-12-11 (×5): qty 1000

## 2011-12-11 MED ORDER — POTASSIUM CHLORIDE 10 MEQ/100ML IV SOLN
10.0000 meq | INTRAVENOUS | Status: AC
  Administered 2011-12-11 (×4): 10 meq via INTRAVENOUS
  Filled 2011-12-11 (×4): qty 100

## 2011-12-11 MED ORDER — DEXTROSE 50 % IV SOLN
25.0000 mL | Freq: Once | INTRAVENOUS | Status: AC
  Administered 2011-12-11: 25 mL via INTRAVENOUS

## 2011-12-11 MED ORDER — DEXTROSE 50 % IV SOLN
INTRAVENOUS | Status: AC
  Administered 2011-12-11: 25 mL
  Filled 2011-12-11: qty 50

## 2011-12-11 NOTE — Progress Notes (Signed)
CRITICAL VALUE ALERT  Critical value received:  K+ 2.1  Date of notification:  12/11/11  Time of notification:  1135  Critical value read back:yes  Nurse who received alert:  Nino Parsley   MD notified (1st page):  Dr. Betti Cruz  Time of first page:  1138  MD notified (2nd page):  Time of second page:  Responding MD:  Dr. Betti Cruz  Time MD responded:  1145

## 2011-12-11 NOTE — Progress Notes (Addendum)
CRITICAL VALUE ALERT  Critical value received:  K+ 2.1  Date of notification:  12/11/11  Time of notification:  1445  Critical value read back:yes  Nurse who received alert:  Miguel Aschoff, RN  MD notified (1st page):  Dr. Betti Cruz  Time of first page:  1448  MD notified (2nd page):  Time of second page:  Responding MD: Dr. Betti Cruz  Time MD responded: [phone]

## 2011-12-11 NOTE — Progress Notes (Signed)
1240 Dr. Betti Cruz notified of one episode of emesis and c/o earache with sniffles.  Stated he will follow. 1300 New orders obtained from Dr. Betti Cruz after exam.  Sister at bedside.

## 2011-12-11 NOTE — Progress Notes (Signed)
CBG 55. Pt Asx, resting in bed without complaints.  1/2 amp D50 administered per protocol.  Recheck CBg after 15 minutes was 156.   Dr. Betti Cruz paged and notified.

## 2011-12-11 NOTE — Progress Notes (Signed)
CRITICAL VALUE ALERT  Critical value received:  K 2.0  Date of notification:  12/11/2011  Time of notification:  0625  Critical value read back:yes  Nurse who received alert:  S. Karleen Hampshire, RN  MD notified (1st page):  [name], NP  Time of first page:  0627  MD notified (2nd page):  Time of second page:  Responding MD:  [name], NP  Time MD responded:  [phone]

## 2011-12-11 NOTE — Progress Notes (Signed)
1500 Repeat potassium level of 2.1 paged to Dr. Betti Cruz.

## 2011-12-11 NOTE — Progress Notes (Addendum)
CBG: 44  Treatment: 25mL of 50% Dextrose  Symptoms: None  Follow-up CBG: Time:2236 CBG Result:175  Possible Reasons for Event: NPO  Comments/MD notified: [name], NP- no new orders received.    Mechele Collin

## 2011-12-11 NOTE — Progress Notes (Signed)
Notified on-call [name], NP of pt critical potassium of 2.0. NP responded and stated she would place orders for runs of potassium. Will continue to monitor pt. [name]

## 2011-12-11 NOTE — Progress Notes (Signed)
Subjective: Abdomen continues to be distended.  Denies any pain.  Vomited today after eating her meal.  Had a good bowel movement with tapwater enema.  Objective: Vital signs in last 24 hours: Filed Vitals:   26-Dec-2011 0620 December 26, 2011 0637 2011/12/26 0735 December 26, 2011 1340  BP: 179/93 192/100 146/70 139/85  Pulse: 91  100 84  Temp: 98.3 F (36.8 C)   98.9 F (37.2 C)  TempSrc:    Axillary  Resp: 18   16  Height:      Weight:      SpO2: 98%   97%   Weight change:   Intake/Output Summary (Last 24 hours) at 2011-12-26 1833 Last data filed at 12/26/2011 1557  Gross per 24 hour  Intake 1909.5 ml  Output   1152 ml  Net  757.5 ml    Physical Exam: General: Awake, No acute distress. HEENT: EOMI. Neck: Supple CV: S1 and S2 Lungs: Clear to ascultation bilaterally Abdomen: Distended, no guarding, hyperactive bowel sounds. Ext: Good pulses. Trace edema.  Lab Results:  Basename 12-26-11 1350 Dec 26, 2011 0957 December 26, 2011 0510 12/10/11 0440  NA 142 141 -- --  K 2.1* 2.1* -- --  CL 104 104 -- --  CO2 26 25 -- --  GLUCOSE 54* 61* -- --  BUN 9 9 -- --  CREATININE 0.57 0.52 -- --  CALCIUM 8.8 8.4 -- --  MG -- -- 1.8 1.8  PHOS -- -- -- --   No results found for this basename: AST:2,ALT:2,ALKPHOS:2,BILITOT:2,PROT:2,ALBUMIN:2 in the last 72 hours No results found for this basename: LIPASE:2,AMYLASE:2 in the last 72 hours  Basename 12/26/11 0510 12/10/11 0440 12/09/11 1600  WBC 10.9* 11.9* --  NEUTROABS -- -- 6.8  HGB 11.2* 11.6* --  HCT 33.1* 35.5* --  MCV 85.1 86.2 --  PLT 349 335 --   No results found for this basename: CKTOTAL:3,CKMB:3,CKMBINDEX:3,TROPONINI:3 in the last 72 hours No components found with this basename: POCBNP:3 No results found for this basename: DDIMER:2 in the last 72 hours No results found for this basename: HGBA1C:2 in the last 72 hours No results found for this basename: CHOL:2,HDL:2,LDLCALC:2,TRIG:2,CHOLHDL:2,LDLDIRECT:2 in the last 72 hours  Basename 12/10/11  0440  TSH 0.671  T4TOTAL --  T3FREE --  THYROIDAB --   No results found for this basename: VITAMINB12:2,FOLATE:2,FERRITIN:2,TIBC:2,IRON:2,RETICCTPCT:2 in the last 72 hours  Micro Results: Recent Results (from the past 240 hour(s))  URINE CULTURE     Status: Normal   Collection Time   12/09/11  4:11 PM      Component Value Range Status Comment   Specimen Description URINE, RANDOM   Final    Special Requests NONE   Final    Setup Time [id number]   Final    Colony Count NO GROWTH   Final    Culture NO GROWTH   Final    Report Status 12/10/2011 FINAL   Final     Studies/Results: Dg Abd 2 Views  12-26-11  *RADIOLOGY REPORT*  Clinical Data: Follow up colonic ileus.  Abdominal distention.  ABDOMEN - 2 VIEW 12/26/11:  Comparison: Acute abdomen series 12/09/2011, 10/24/2011, 09/07/2011 [facility].  Findings: Marked gaseous distention of the sigmoid colon, increased since the examination 2 days ago.  Moderate gaseous distention of the remainder of the colon.  No significant small bowel distention. Colonic air fluid levels on the lateral decubitus image consistent with liquid stool.  No evidence of free intraperitoneal air.  Note again made of severe degenerative changes involving the right hip  and moderately severe degenerative changes involving the left hip.  IMPRESSION: Worsening colonic ileus, with increased distention of the sigmoid colon.  Rectal tube decompression might be considered.  No evidence of free intraperitoneal air.  Original Report Authenticated By: Arnell Sieving, M.D.    Medications: I have reviewed the patient's current medications. Scheduled Meds:   . bisacodyl  10 mg Oral BID  . carvedilol  25 mg Oral BID WC  . cefTRIAXone (ROCEPHIN)  IV  1 g Intravenous Q24H  . dextrose      . enoxaparin  40 mg Subcutaneous Q24H  . insulin aspart  0-15 Units Subcutaneous TID WC  . insulin aspart  0-5 Units Subcutaneous QHS  . insulin glargine  20 Units Subcutaneous  Q24H  . lisinopril  40 mg Oral Daily  . metoCLOPramide (REGLAN) injection  10 mg Intravenous Q6H  . multivitamins ther. w/minerals  1 tablet Oral Daily  . polyethylene glycol  17 g Oral BID  . polyethylene glycol  17 g Oral Once  . potassium chloride  10 mEq Intravenous Q1 Hr x 4  . potassium chloride  40 mEq Oral BID  . potassium chloride  40 mEq Oral Once  . potassium chloride  40 mEq Oral Once  . sodium phosphate  1 enema Rectal Once  . DISCONTD: hydrochlorothiazide  12.5 mg Oral Daily  . DISCONTD: potassium chloride  60 mEq Oral Once   Continuous Infusions:   . dextrose 5 % with KCl 20 mEq / L    . DISCONTD: 0.45 % NaCl with KCl 20 mEq / L 75 mL/hr at 12/11/11 0514   PRN Meds:.acetaminophen, acetaminophen, hydrALAZINE, morphine, ondansetron (ZOFRAN) IV, ondansetron, sodium phosphate  Assessment/Plan:  1. Encephalopathy.  Multifactorial.  Improved to baseline.  2. DM type 2 (diabetes mellitus, type 2), uncontrolled with complications.  Had episode of hypoglycemia.  As the patient is n.p.o. start the patient on D5W.  3. HTN, malignant.  Not well controlled.  Continue carvedilol.  Lisinopril dose has been increased to 40 mg.  Continue to monitor.  4.  History of obstipation/ileus.  Abdominal x-ray shows worsening colonic ileus.  Will have the patient n.p.o.  Ileus.  To order abdominal x-ray in the morning if no improvement with abdominal distention.  Continue bowel regimen and enema.  Has had a good bowel movement today on top water enema.  If improved tomorrow consider starting the patient on clear liquid diet and advance as tolerated.  5. Hypokalemia.  Replace as necessary, ordered both IV and by mouth potassium.  6. UTI (lower urinary tract infection).  Ceftriaxone since 12/09/2011.  Defined for 5 days empiric course.  Urine culture on December 30 shows no growth to date.  Urine culture from December 31 of 2012 pending.  7.  Prophylaxis.  Lovenox.  8.  Disposition.   Pending.   LOS: 2 days  Loida Calamia A, MD 12/11/2011, 6:33 PM

## 2011-12-12 LAB — CBC
MCV: 84.2 fL (ref 78.0–100.0)
Platelets: 354 10*3/uL (ref 150–400)
RBC: 4.17 MIL/uL (ref 3.87–5.11)
WBC: 12.1 10*3/uL — ABNORMAL HIGH (ref 4.0–10.5)

## 2011-12-12 LAB — BASIC METABOLIC PANEL
CO2: 22 mEq/L (ref 19–32)
Chloride: 102 mEq/L (ref 96–112)
Sodium: 137 mEq/L (ref 135–145)

## 2011-12-12 LAB — GLUCOSE, CAPILLARY
Glucose-Capillary: 50 mg/dL — ABNORMAL LOW (ref 70–99)
Glucose-Capillary: 126 mg/dL — ABNORMAL HIGH (ref 70–99)

## 2011-12-12 MED ORDER — DEXTROSE 50 % IV SOLN
INTRAVENOUS | Status: AC
  Administered 2011-12-12: 25 mL
  Filled 2011-12-12: qty 50

## 2011-12-12 MED ORDER — ZOLPIDEM TARTRATE 5 MG PO TABS
5.0000 mg | ORAL_TABLET | Freq: Every evening | ORAL | Status: DC | PRN
  Administered 2011-12-13 (×2): 5 mg via ORAL
  Filled 2011-12-12 (×2): qty 1

## 2011-12-12 MED ORDER — POTASSIUM CHLORIDE 20 MEQ/15ML (10%) PO LIQD
40.0000 meq | Freq: Once | ORAL | Status: AC
  Administered 2011-12-12: 40 meq via ORAL
  Filled 2011-12-12: qty 30

## 2011-12-12 MED ORDER — POTASSIUM CHLORIDE CRYS ER 20 MEQ PO TBCR
40.0000 meq | EXTENDED_RELEASE_TABLET | Freq: Once | ORAL | Status: AC
  Administered 2011-12-12: 40 meq via ORAL
  Filled 2011-12-12: qty 2

## 2011-12-12 MED ORDER — POTASSIUM CHLORIDE 20 MEQ/15ML (10%) PO LIQD
40.0000 meq | Freq: Two times a day (BID) | ORAL | Status: DC
  Filled 2011-12-12 (×2): qty 30

## 2011-12-12 NOTE — Progress Notes (Signed)
Inpatient Diabetes Program Recommendations  AACE/ADA: New Consensus Statement on Inpatient Glycemic Control (2009)  Target Ranges:  Prepandial:   less than 140 mg/dL      Peak postprandial:   less than 180 mg/dL (1-2 hours)      Critically ill patients:  140 - 180 mg/dL   Reason for Visit: Hypoglycemia  Inpatient Diabetes Program Recommendations Insulin - Basal: Pt has had a few low blood sugars while on Lantus 20 units.  Would decreae to 10 units plus correction as noted below. Correction (SSI): While NPO use sensitive correction q 4 hrs.  Note: Once eating 100 % might increase Lantus back to 20 units Lenor Coffin, RN, CDE [phone])

## 2011-12-12 NOTE — Progress Notes (Signed)
Subjective: Mental status appears at baseline. No voiting today. Patient had good bowel movement after tap water enema on Jan 01, 2011.  Objective: Vital signs in last 24 hours: Temp:  [97.8 F (36.6 C)-98.1 F (36.7 C)] 97.8 F (36.6 C) (01/02 1256) Pulse Rate:  [73-83] 77  (01/02 1256) Resp:  [16-18] 18  (01/02 1256) BP: (138-169)/(79-84) 169/84 mmHg (01/02 1256) SpO2:  [96 %] 96 % (01/02 1256) Weight change:  Last BM Date: 12/12/11  Intake/Output from previous day: 2023/01/01 0701 - 01/02 0700 In: 3398 [P.O.:120; I.V.:1770; IV Piggyback:508] Out: 702 [Urine:700; Emesis/NG output:1; Stool:1] Total I/O In: 400 [I.V.:400] Out: 125 [Urine:125]   Physical Exam: General: Comfortable, alert, follows simple instructions, not communicative, not short of breath at rest.  HEENT:  Mild clinical pallor, no jaundice, no conjunctival injection or discharge. Hydration status is ok. NECK:  Supple, JVP not seen, no carotid bruits, no palpable lymphadenopathy, no palpable goiter. CHEST:  Clinically clear to auscultation, no wheezes, no crackles. HEART:  Sounds 1 and 2 heard, normal, regular, no murmurs. ABDOMEN:  Full, softly distended, non-tender, no palpable organomegaly, no palpable masses, tinkling bowel sounds. GENITALIA:  Not examined. LOWER EXTREMITIES:  No pitting edema, palpable peripheral pulses. MUSCULOSKELETAL SYSTEM:  Unremarkable. CENTRAL NERVOUS SYSTEM:  No focal neurologic deficit on gross examination.  Lab Results:  Basename 12/12/11 0405 01/02/2012 0510  WBC 12.1* 10.9*  HGB 11.6* 11.2*  HCT 35.1* 33.1*  PLT 354 349    Basename 12/12/11 0405 01/02/12 1350  NA 137 142  K 2.5* 2.1*  CL 102 104  CO2 22 26  GLUCOSE 70 54*  BUN 9 9  CREATININE 0.61 0.57  CALCIUM 8.9 8.8   Recent Results (from the past 240 hour(s))  URINE CULTURE     Status: Normal   Collection Time   12/09/11  4:11 PM      Component Value Range Status Comment   Specimen Description URINE, RANDOM   Final      Special Requests NONE   Final    Setup Time [id number]   Final    Colony Count NO GROWTH   Final    Culture NO GROWTH   Final    Report Status 12/10/2011 FINAL   Final   URINE CULTURE     Status: Normal   Collection Time   12/10/11 12:46 PM      Component Value Range Status Comment   Specimen Description URINE, RANDOM   Final    Special Requests NONE   Final    Setup Time [id number]   Final    Colony Count NO GROWTH   Final    Culture NO GROWTH   Final    Report Status 01/02/12 FINAL   Final      Studies/Results: Dg Abd 2 Views  01-02-12  *RADIOLOGY REPORT*  Clinical Data: Follow up colonic ileus.  Abdominal distention.  ABDOMEN - 2 VIEW 02-Jan-2012:  Comparison: Acute abdomen series 12/09/2011, 10/24/2011, 09/07/2011 [facility].  Findings: Marked gaseous distention of the sigmoid colon, increased since the examination 2 days ago.  Moderate gaseous distention of the remainder of the colon.  No significant small bowel distention. Colonic air fluid levels on the lateral decubitus image consistent with liquid stool.  No evidence of free intraperitoneal air.  Note again made of severe degenerative changes involving the right hip and moderately severe degenerative changes involving the left hip.  IMPRESSION: Worsening colonic ileus, with increased distention of the sigmoid colon.  Rectal tube decompression  might be considered.  No evidence of free intraperitoneal air.  Original Report Authenticated By: Arnell Sieving, M.D.    Medications: Scheduled Meds:    . bisacodyl  10 mg Oral BID  . carvedilol  25 mg Oral BID WC  . cefTRIAXone (ROCEPHIN)  IV  1 g Intravenous Q24H  . dextrose  25 mL Intravenous Once  . dextrose      . enoxaparin  40 mg Subcutaneous Q24H  . insulin aspart  0-15 Units Subcutaneous TID WC  . insulin aspart  0-5 Units Subcutaneous QHS  . insulin glargine  20 Units Subcutaneous Q24H  . lisinopril  40 mg Oral Daily  . metoCLOPramide (REGLAN)  injection  10 mg Intravenous Q6H  . multivitamins ther. w/minerals  1 tablet Oral Daily  . polyethylene glycol  17 g Oral BID  . polyethylene glycol  17 g Oral Once  . potassium chloride  10 mEq Intravenous Q1 Hr x 4  . potassium chloride  40 mEq Oral BID  . potassium chloride  40 mEq Oral Once  . sodium phosphate  1 enema Rectal Once   Continuous Infusions:    . dextrose 5 % with KCl 20 mEq / L 20 mEq (12/12/11 1451)  . DISCONTD: 0.45 % NaCl with KCl 20 mEq / L 75 mL/hr at 12/11/11 0514   PRN Meds:.acetaminophen, acetaminophen, hydrALAZINE, morphine, ondansetron (ZOFRAN) IV, ondansetron, sodium phosphate  Assessment/Plan:  Principal Problem:  *Encephalopathy: Toxic, secondary to UTI, although a component of hypertensive encephalopathy is possible. Mental status is now back to baseline.  Active Problems:  1. DM type 2 (diabetes mellitus, type 2): Controlled on SSI and Lantus.  2. HTN (hypertension): Improved control, on Coreg and Lisinopril.  3. Ileus: AXR of 1/`1/12, showed worsening ileus, and patient did have vomiting. Good results from enema. Will repeat today. Will manage with Reglan in addition, and follow AXR. Patient wants to eat. Will try clears.  4. Hypokalemia: Repleting as indicated.  5. UTI (lower urinary tract infection): On Rocephin, day# 4. Wcc is still elevated, although no pyrexia has been recorded. Will continue for now. Urine cultures show no growth so far.    LOS: 3 days   [name],CHRISTOPHER 12/12/2011, 4:14 PM

## 2011-12-12 NOTE — Progress Notes (Signed)
CRITICAL VALUE ALERT  Critical value received:  K 2.5  Date of notification:  12/12/2011  Time of notification:  0604  Critical value read back:Yes  Nurse who received alert: Crystal, RN relayed to S. Karleen Hampshire, RN  MD notified (1st page):  [name], NP  Time of first page:  0608  MD notified (2nd page):  Time of second page:  Responding MD:  [name], NP  Time MD responded:  Berit.[email or web address]  -NP states she will place new orders for pt in Epic. Will continue to monitor. Mechele Collin

## 2011-12-12 NOTE — Progress Notes (Signed)
1700 cbg 50, d50 25cc iv given

## 2011-12-13 ENCOUNTER — Inpatient Hospital Stay (HOSPITAL_COMMUNITY): Payer: Medicare Other

## 2011-12-13 LAB — COMPREHENSIVE METABOLIC PANEL
ALT: 17 U/L (ref 0–35)
AST: 18 U/L (ref 0–37)
Alkaline Phosphatase: 71 U/L (ref 39–117)
CO2: 22 mEq/L (ref 19–32)
Calcium: 8.6 mg/dL (ref 8.4–10.5)
GFR calc non Af Amer: >90 mL/min (ref >90)
Glucose, Bld: 101 mg/dL — ABNORMAL HIGH (ref 70–99)
Potassium: 2.6 mEq/L — CL (ref 3.5–5.1)
Sodium: 140 mEq/L (ref 135–145)
Total Protein: 5.6 g/dL — ABNORMAL LOW (ref 6.0–8.3)

## 2011-12-13 LAB — CBC
Hemoglobin: 10.8 g/dL — ABNORMAL LOW (ref 12.0–15.0)
MCH: 28.1 pg (ref 26.0–34.0)
Platelets: 338 10*3/uL (ref 150–400)
RBC: 3.85 MIL/uL — ABNORMAL LOW (ref 3.87–5.11)

## 2011-12-13 LAB — GLUCOSE, CAPILLARY
Glucose-Capillary: 101 mg/dL — ABNORMAL HIGH (ref 70–99)
Glucose-Capillary: 55 mg/dL — ABNORMAL LOW (ref 70–99)
Glucose-Capillary: 72 mg/dL (ref 70–99)

## 2011-12-13 MED ORDER — CLONIDINE HCL 0.2 MG/24HR TD PTWK
0.2000 mg | MEDICATED_PATCH | TRANSDERMAL | Status: DC
  Administered 2011-12-13: 0.2 mg via TRANSDERMAL
  Filled 2011-12-13: qty 1

## 2011-12-13 MED ORDER — IOHEXOL 300 MG/ML  SOLN
100.0000 mL | Freq: Once | INTRAMUSCULAR | Status: AC | PRN
  Administered 2011-12-13: 100 mL via INTRAVENOUS

## 2011-12-13 MED ORDER — POTASSIUM CHLORIDE CRYS ER 20 MEQ PO TBCR
40.0000 meq | EXTENDED_RELEASE_TABLET | Freq: Once | ORAL | Status: AC
  Administered 2011-12-13: 40 meq via ORAL
  Filled 2011-12-13: qty 2

## 2011-12-13 MED ORDER — POTASSIUM CHLORIDE 20 MEQ/15ML (10%) PO LIQD
40.0000 meq | Freq: Three times a day (TID) | ORAL | Status: DC
  Administered 2011-12-13 – 2011-12-14 (×6): 40 meq via ORAL
  Filled 2011-12-13 (×9): qty 30

## 2011-12-13 MED ORDER — DEXTROSE 50 % IV SOLN
INTRAVENOUS | Status: AC
  Administered 2011-12-13: 25 mL
  Filled 2011-12-13: qty 50

## 2011-12-13 NOTE — Progress Notes (Signed)
NGT placed left nare, placement confirmed by auscultation. Oral contrast administered via tube and CT staff notified of intake.

## 2011-12-13 NOTE — Progress Notes (Signed)
NGT pulled out by pt. Nightshift RN will wait for results of CT scan before reinserting tubing to evaluate need.

## 2011-12-13 NOTE — Progress Notes (Signed)
Subjective: Vomited again today.  Objective: Vital signs in last 24 hours: Temp:  [97.8 F (36.6 C)-98.9 F (37.2 C)] 98.9 F (37.2 C) (01/03 0554) Pulse Rate:  [75-81] 80  (01/03 1010) Resp:  [18] 18  (01/03 0554) BP: (113-183)/(76-102) 183/94 mmHg (01/03 1010) SpO2:  [96 %-99 %] 98 % (01/03 1010) Weight change:  Last BM Date: 12/12/11  Intake/Output from previous day: 01/02 0701 - 01/03 0700 In: 1200 [I.V.:1200] Out: 327 [Urine:327] Total I/O In: 240 [P.O.:240] Out: 125 [Urine:125]   Physical Exam: General: Comfortable, alert, follows simple instructions, not communicative, not short of breath at rest.  HEENT:  Mild clinical pallor, no jaundice, no conjunctival injection or discharge. Hydration status is ok. NECK:  Supple, JVP not seen, no carotid bruits, no palpable lymphadenopathy, no palpable goiter. CHEST:  Clinically clear to auscultation, no wheezes, no crackles. HEART:  Sounds 1 and 2 heard, normal, regular, no murmurs. ABDOMEN:  Distended, non-tender, tympanitic, no palpable organomegaly, no palpable masses, tinkling bowel sounds. GENITALIA:  Not examined. LOWER EXTREMITIES:  No pitting edema, palpable peripheral pulses. MUSCULOSKELETAL SYSTEM:  Unremarkable. CENTRAL NERVOUS SYSTEM:  No focal neurologic deficit on gross examination.  Lab Results:  Basename 12/13/11 0355 12/12/11 0405  WBC 8.3 12.1*  HGB 10.8* 11.6*  HCT 32.7* 35.1*  PLT 338 354    Basename 12/13/11 0355 12/12/11 0405  NA 140 137  K 2.6* 2.5*  CL 107 102  CO2 22 22  GLUCOSE 101* 70  BUN 7 9  CREATININE 0.59 0.61  CALCIUM 8.6 8.9   Recent Results (from the past 240 hour(s))  URINE CULTURE     Status: Normal   Collection Time   12/09/11  4:11 PM      Component Value Range Status Comment   Specimen Description URINE, RANDOM   Final    Special Requests NONE   Final    Setup Time [id number]   Final    Colony Count NO GROWTH   Final    Culture NO GROWTH   Final    Report Status  12/10/2011 FINAL   Final   URINE CULTURE     Status: Normal   Collection Time   12/10/11 12:46 PM      Component Value Range Status Comment   Specimen Description URINE, RANDOM   Final    Special Requests NONE   Final    Setup Time [id number]   Final    Colony Count NO GROWTH   Final    Culture NO GROWTH   Final    Report Status 12/11/2011 FINAL   Final      Studies/Results: Dg Abd 1 View  12/13/2011  *RADIOLOGY REPORT*  Clinical Data: Abdominal distention.  Follow-up ileus.  ABDOMEN - 1 VIEW  Comparison: 12/11/2011  Findings: Diffuse colonic dilatation shows no significant interval change.  No bowel gas or stool seen in the rectum.  Differential diagnosis includes severe colonic ileus and obstructing rectal mass.  IMPRESSION: Stable diffuse colonic dilatation, which may be due to a severe colonic ileus versus distal colonic obstruction/rectal mass. Correlation with physical exam findings is recommended, and CT could be performed for further evaluation if clinically warranted.  Original Report Authenticated By: Danae Orleans, M.D.   Dg Abd 2 Views  12/11/2011  *RADIOLOGY REPORT*  Clinical Data: Follow up colonic ileus.  Abdominal distention.  ABDOMEN - 2 VIEW 12/11/2011:  Comparison: Acute abdomen series 12/09/2011, 10/24/2011, 09/07/2011 [facility]  Findings: Marked gaseous distention of the  sigmoid colon, increased since the examination 2 days ago.  Moderate gaseous distention of the remainder of the colon.  No significant small bowel distention. Colonic air fluid levels on the lateral decubitus image consistent with liquid stool.  No evidence of free intraperitoneal air.  Note again made of severe degenerative changes involving the right hip and moderately severe degenerative changes involving the left hip.  IMPRESSION: Worsening colonic ileus, with increased distention of the sigmoid colon.  Rectal tube decompression might be considered.  No evidence of free intraperitoneal air.   Original Report Authenticated By: Arnell Sieving, M.D.    Medications: Scheduled Meds:    . bisacodyl  10 mg Oral BID  . carvedilol  25 mg Oral BID WC  . cefTRIAXone (ROCEPHIN)  IV  1 g Intravenous Q24H  . dextrose      . enoxaparin  40 mg Subcutaneous Q24H  . insulin aspart  0-15 Units Subcutaneous TID WC  . insulin aspart  0-5 Units Subcutaneous QHS  . insulin glargine  20 Units Subcutaneous Q24H  . lisinopril  40 mg Oral Daily  . metoCLOPramide (REGLAN) injection  10 mg Intravenous Q6H  . multivitamins ther. w/minerals  1 tablet Oral Daily  . polyethylene glycol  17 g Oral BID  . polyethylene glycol  17 g Oral Once  . potassium chloride  40 mEq Oral Once  . potassium chloride  40 mEq Oral TID  . potassium chloride  40 mEq Oral Once  . sodium phosphate  1 enema Rectal Once  . DISCONTD: potassium chloride  40 mEq Oral BID  . DISCONTD: potassium chloride  40 mEq Oral BID   Continuous Infusions:    . dextrose 5 % with KCl 20 mEq / L 20 mEq (12/13/11 0007)   PRN Meds:.acetaminophen, acetaminophen, hydrALAZINE, morphine, ondansetron (ZOFRAN) IV, ondansetron, sodium phosphate, zolpidem  Assessment/Plan:  Principal Problem:  *Encephalopathy: Toxic, secondary to UTI, although a component of hypertensive encephalopathy is possible. Mental status is now back to baseline.  Active Problems:  1. DM type 2 (diabetes mellitus, type 2): Controlled on SSI and Lantus.  2. HTN (hypertension): Still uncontrolled, on Coreg and Lisinopril. Will add Clonidine patch.  3. Ileus: AXR of 12/10/10, showed worsening ileus, and AXR of 12/12/10, is not encouraging. Patient continues too be symptomatic. Will do abdominal CT scan, and request specialist consultation, depending on findings..  4. Hypokalemia: Repleting as indicated.  5. UTI (lower urinary tract infection): On Rocephin, day# 5/7. Wcc has normalized, and no pyrexia has been recorded. Urine cultures show no growth so far. We shall complete 7  days Rx.    LOS: 4 days   [name],CHRISTOPHER 12/13/2011, 11:53 AM

## 2011-12-13 NOTE — Progress Notes (Signed)
Called next of kin, Rexene Edison [place], at 2:40pm today to notify her of noncompliance of pt taking oral contrast, sister stated she would be up here within 30 minutes to assist.  Family has not arrived. Called again and left message with female to have Ms. Washington call hospital, no return call.  Support given to patient.

## 2011-12-13 NOTE — Progress Notes (Signed)
CRITICAL VALUE ALERT  Critical value received:  Potassium 2.6  Date of notification:  12/13/11  Time of notification:  0611  Critical value read back: yes  Nurse who received alert: L. Tiburcio Pea RN  MD notified (1st page):  [name]  Time of first page:  0612  MD notified (2nd page):  Time of second page:  Responding MD: [name] Time MD responded:  [phone]

## 2011-12-13 NOTE — Progress Notes (Signed)
CBG taken @ 16:40 =55. Hypoglycemic protocol initiated, 25ml IV Dextrose given. CBG rechecked = 138. No S&Sx . Possible reasons for event: inadequate meal intake & vomiting today. Will continue to monitor and report to nightshift RN occurrence.

## 2011-12-14 LAB — COMPREHENSIVE METABOLIC PANEL
ALT: 20 U/L (ref 0–35)
AST: 19 U/L (ref 0–37)
Albumin: 2.9 g/dL — ABNORMAL LOW (ref 3.5–5.2)
Albumin: 3.5 g/dL (ref 3.5–5.2)
Alkaline Phosphatase: 72 U/L (ref 39–117)
Alkaline Phosphatase: 89 U/L (ref 39–117)
BUN: 6 mg/dL (ref 6–23)
CO2: 19 mEq/L (ref 19–32)
CO2: 20 mEq/L (ref 19–32)
Chloride: 104 mEq/L (ref 96–112)
Chloride: 104 mEq/L (ref 96–112)
GFR calc non Af Amer: >90 mL/min (ref >90)
Glucose, Bld: 66 mg/dL — ABNORMAL LOW (ref 70–99)
Potassium: 2.4 mEq/L — CL (ref 3.5–5.1)
Potassium: 2.6 mEq/L — CL (ref 3.5–5.1)
Sodium: 135 mEq/L (ref 135–145)
Total Bilirubin: 0.3 mg/dL (ref 0.3–1.2)
Total Bilirubin: 0.4 mg/dL (ref 0.3–1.2)

## 2011-12-14 LAB — CBC
Platelets: 333 10*3/uL (ref 150–400)
RBC: 4.06 MIL/uL (ref 3.87–5.11)
RDW: 14.4 % (ref 11.5–15.5)
WBC: 9.8 10*3/uL (ref 4.0–10.5)

## 2011-12-14 LAB — GLUCOSE, CAPILLARY
Glucose-Capillary: 96 mg/dL (ref 70–99)
Glucose-Capillary: 39 mg/dL — CL (ref 70–99)
Glucose-Capillary: 103 mg/dL — ABNORMAL HIGH (ref 70–99)
Glucose-Capillary: 80 mg/dL (ref 70–99)

## 2011-12-14 MED ORDER — POTASSIUM CHLORIDE 10 MEQ/100ML IV SOLN
10.0000 meq | INTRAVENOUS | Status: AC
  Administered 2011-12-14 (×2): 10 meq via INTRAVENOUS
  Filled 2011-12-14 (×2): qty 100

## 2011-12-14 MED ORDER — HYDRALAZINE HCL 20 MG/ML IJ SOLN: 10.0000 mg | Freq: Four times a day (QID) | INTRAMUSCULAR | Status: DC | PRN

## 2011-12-14 MED ORDER — LORAZEPAM 2 MG/ML PO CONC
1.0000 mg | Freq: Once | ORAL | Status: DC
  Administered 2011-12-14: 1 mg via ORAL

## 2011-12-14 MED ORDER — LORAZEPAM 2 MG/ML IJ SOLN: 1.0000 mg | Freq: Once | INTRAMUSCULAR | Status: DC

## 2011-12-14 MED ORDER — CLONIDINE HCL 0.3 MG/24HR TD PTWK
0.3000 mg | MEDICATED_PATCH | TRANSDERMAL | Status: DC
  Administered 2011-12-14: 0.3 mg via TRANSDERMAL
  Filled 2011-12-14: qty 1

## 2011-12-14 MED ORDER — DEXTROSE-NACL 5-0.9 % IV SOLN
INTRAVENOUS | Status: DC
  Administered 2011-12-14 – 2011-12-16 (×4): via INTRAVENOUS

## 2011-12-14 MED ORDER — GLUCOSE 40 % PO GEL
1.0000 | Freq: Once | ORAL | Status: AC
  Administered 2011-12-14: 37.5 g via ORAL

## 2011-12-14 MED ORDER — GLUCOSE 40 % PO GEL
ORAL | Status: AC
  Administered 2011-12-14: 37.5 g via ORAL
  Filled 2011-12-14: qty 1

## 2011-12-14 MED ORDER — LORAZEPAM 2 MG/ML IJ SOLN
1.0000 mg | Freq: Four times a day (QID) | INTRAMUSCULAR | Status: DC | PRN
  Administered 2011-12-17: 1 mg via INTRAVENOUS
  Filled 2011-12-14: qty 1

## 2011-12-14 MED ORDER — LORAZEPAM 2 MG/ML IJ SOLN
INTRAMUSCULAR | Status: AC
  Filled 2011-12-14: qty 1

## 2011-12-14 MED ORDER — LORAZEPAM 2 MG/ML PO CONC: 1.0000 mg | Freq: Four times a day (QID) | ORAL | Status: DC | PRN

## 2011-12-14 NOTE — Consult Note (Signed)
[facility] Consultation Note  Referring Provider: Dr. Isidor Holts  Reason for Consultation:  Abdominal distention, ileus  HPI: [name] is a 61 y.o. female with history chronic mental retardation, cared for by her sister [name], who is at bedside today.  She has history of chronic colonic inertia, which has been managed with aggressive bowel regimen (multiple doses of Miralax daily and enemas as needed).  She was admitted to hospital for altered mental status over the past several days.  Her sister tells me that she was not receiving her bowel regimen during this period of time and, as a result, her abdominal distention has worsened.  Patient is unable to provide any history at this time.  No reported blood in stool.  CT recently showed diffuse colonic distention without any overt mass.  Has similar presentation in April 2012, with evaluation by Dr. Juanda Chance including colonoscopy, which, though unable to reach cecum, showed diffusely dilated atonic colon without obvious mass.  It doesn't appear that she had followed up with Dr. Juanda Chance after her hospitalization at that time.   Past Medical History  Diagnosis Date  . Hypertension   . Stroke   . Diabetes mellitus     History reviewed. No pertinent past surgical history.  Prior to Admission medications   Medication Sig Start Date End Date Taking? Authorizing Provider  bisacodyl (FLEET) 10 MG/30ML ENEM Place 10 mg rectally once. Taking once every 3 days   Yes Historical Provider, MD  carvedilol (COREG) 12.5 MG tablet Take 12.5 mg by mouth 2 (two) times daily with a meal.     Yes Historical Provider, MD  insulin glargine (LANTUS) 100 UNIT/ML injection Inject 20 Units into the skin every morning.     Yes Historical Provider, MD  lisinopril-hydrochlorothiazide (PRINZIDE,ZESTORETIC) 20-12.5 MG per tablet Take 1 tablet by mouth 2 (two) times daily.     Yes Historical Provider, MD  Multiple Vitamins-Minerals (MULTIVITAMINS THER.  W/MINERALS) TABS Take 1 tablet by mouth daily.     Yes Historical Provider, MD  senna-docusate (SENOKOT-S) 8.6-50 MG per tablet Take 1 tablet by mouth 2 (two) times daily. 10/27/11 10/26/12 Yes Christina Rama    Current Facility-Administered Medications  Medication Dose Route Frequency Provider Last Rate Last Dose  . acetaminophen (TYLENOL) tablet 650 mg  650 mg Oral Q6H PRN Jehanzeb Memon   650 mg at 12/13/11 4098   Or  . acetaminophen (TYLENOL) suppository 650 mg  650 mg Rectal Q6H PRN Jehanzeb Memon      . bisacodyl (DULCOLAX) EC tablet 10 mg  10 mg Oral BID Jehanzeb Memon   10 mg at 12/14/11 1258  . carvedilol (COREG) tablet 25 mg  25 mg Oral BID WC Jehanzeb Memon   25 mg at 12/14/11 1258  . cefTRIAXone (ROCEPHIN) 1 g in dextrose 5 % 50 mL IVPB  1 g Intravenous Q24H Jehanzeb Memon   1 g at 12/13/11 1736  . cloNIDine (CATAPRES - Dosed in mg/24 hr) patch 0.2 mg  0.2 mg Transdermal Weekly Christopher Oti   0.2 mg at 12/13/11 1249  . dextrose (GLUTOSE) 40 % oral gel 37.5 g  1 Tube Oral Once [employer]      . dextrose (GLUTOSE) 40 % oral gel        37.5 g at 12/14/11 1213  . dextrose 5 % with KCl 20 mEq / L  infusion  20 mEq Intravenous Continuous Srikar A Reddy 50 mL/hr at 12/14/11 0621 20 mEq at 12/14/11 0621  .  dextrose 50 % solution        25 mL at 12/13/11 1645  . enoxaparin (LOVENOX) injection 40 mg  40 mg Subcutaneous Q24H Jehanzeb Memon   40 mg at 12/14/11 0829  . hydrALAZINE (APRESOLINE) injection 10 mg  10 mg Intravenous Q6H PRN Jehanzeb Memon   10 mg at 12/11/11 0644  . insulin aspart (novoLOG) injection 0-15 Units  0-15 Units Subcutaneous TID WC Jehanzeb Memon   2 Units at 12/14/11 0829  . insulin aspart (novoLOG) injection 0-5 Units  0-5 Units Subcutaneous QHS Jehanzeb Memon      . insulin glargine (LANTUS) injection 20 Units  20 Units Subcutaneous Q24H Jehanzeb Memon   20 Units at 12/14/11 0829  . iohexol (OMNIPAQUE) 300 MG/ML solution 100 mL  100 mL Intravenous Once PRN  Medication Radiologist   100 mL at 12/13/11 2114  . lisinopril (PRINIVIL,ZESTRIL) tablet 40 mg  40 mg Oral Daily Christopher Oti   40 mg at 12/14/11 1256  . metoCLOPramide (REGLAN) injection 10 mg  10 mg Intravenous Q6H Jehanzeb Memon   10 mg at 12/14/11 1302  . morphine 2 MG/ML injection 1 mg  1 mg Intravenous Q4H PRN Jehanzeb Memon      . multivitamins ther. w/minerals tablet 1 tablet  1 tablet Oral Daily Jehanzeb Memon   1 tablet at 12/13/11 1009  . ondansetron (ZOFRAN) tablet 4 mg  4 mg Oral Q6H PRN Jehanzeb Memon       Or  . ondansetron (ZOFRAN) injection 4 mg  4 mg Intravenous Q6H PRN Jehanzeb Memon   4 mg at 12/11/11 1223  . polyethylene glycol (MIRALAX / GLYCOLAX) packet 17 g  17 g Oral BID Jehanzeb Memon   17 g at 12/14/11 1255  . polyethylene glycol (MIRALAX / GLYCOLAX) packet 17 g  17 g Oral Once [employer] PHARMD      . potassium chloride 10 mEq in 100 mL IVPB  10 mEq Intravenous Q1 Hr x 2 Christopher Oti   10 mEq at 12/14/11 1004  . potassium chloride 20 MEQ/15ML (10%) liquid 40 mEq  40 mEq Oral TID Christopher Oti   40 mEq at 12/14/11 1301  . sodium phosphate (FLEET) 7-19 GM/118ML enema 1 enema  1 enema Rectal Once Jehanzeb Memon      . sodium phosphate (FLEET) 7-19 GM/118ML enema 1 enema  1 enema Rectal Daily PRN Christopher Oti   1 enema at 12/12/11 2312  . zolpidem (AMBIEN) tablet 5 mg  5 mg Oral QHS PRN Christopher Oti   5 mg at 12/13/11 2358    Allergies as of 12/09/2011  . (No Known Allergies)    History reviewed. No pertinent family history.  History   Social History  . Marital Status: Single    Spouse Name: N/A    Number of Children: N/A  . Years of Education: N/A   Occupational History  . Not on file.   Social History Main Topics  . Smoking status: Never Smoker   . Smokeless tobacco: Never Used  . Alcohol Use: No  . Drug Use: No  . Sexually Active: No   Other Topics Concern  . Not on file   Social History Narrative  . No narrative  on file    Review of Systems:  Unable to be obtained (altered mental status)  Physical Exam: Vital signs in last 24 hours: Temp:  [98.3 F (36.8 C)-98.4 F (36.9 C)] 98.3 F (36.8 C) (01/04 1405) Pulse Rate:  [  18-79] 79  (01/04 1405) Resp:  [18-20] 18  (01/04 1405) BP: (133-147)/(85-90) 147/88 mmHg (01/04 1405) SpO2:  [94 %-98 %] 98 % (01/04 1405) Last BM Date: 12/14/11 General:   Opens eyes, chronically ill-appearing, confused Head:  Cachectic appearing with bitemporal wasting Eyes:  Sclera clear, no icterus.   Conjunctiva dry. Ears:  Normal auditory acuity. Nose:  No deformity, discharge,  or lesions. Mouth:  No deformity or lesions.  Oropharynx pink & moist. Neck:  Supple; no masses or thyromegaly. Lungs:  Clear throughout to auscultation.   No wheezes, crackles, or rhonchi. No acute distress. Heart:  Regular rate and rhythm; no murmurs, clicks, rubs,  or gallops. Abdomen:  Markedly distended and tympanic, with hyperactive high-pitched bowel sounds.  No obvious tenderness.   Msk:  Symmetrical without gross deformities. Normal posture. Pulses:  Normal pulses noted. Extremities:  Without clubbing or edema. Neurologic: confused Skin:  Intact without significant lesions or rashes. Psych:  Little agitated and combative; confused   Lab Results:  Basename 12/14/11 0410 12/13/11 0355 12/12/11 0405  WBC 9.8 8.3 12.1*  HGB 11.4* 10.8* 11.6*  HCT 34.3* 32.7* 35.1*  PLT 333 338 354   BMET  Basename 12/14/11 1109 12/14/11 0410 12/13/11 0355  NA 135 135 140  K 2.6* 2.4* 2.6*  CL 104 104 107  CO2 20 19 22   GLUCOSE 66* 88 101*  BUN 6 6 7   CREATININE 0.57 0.54 0.59  CALCIUM 9.2 8.7 8.6   LFT  Basename 12/14/11 1109  PROT 7.4  ALBUMIN 3.5  AST 25  ALT 25  ALKPHOS 89  BILITOT 0.4  BILIDIR --  IBILI --   PT/INR No results found for this basename: LABPROT:2,INR:2 in the last 72 hours  Studies/Results: Dg Abd 1 View  12/13/2011  *RADIOLOGY REPORT*  Clinical Data:  Abdominal distention.  Follow-up ileus.  ABDOMEN - 1 VIEW  Comparison: 12/11/2011  Findings: Diffuse colonic dilatation shows no significant interval change.  No bowel gas or stool seen in the rectum.  Differential diagnosis includes severe colonic ileus and obstructing rectal mass.  IMPRESSION: Stable diffuse colonic dilatation, which may be due to a severe colonic ileus versus distal colonic obstruction/rectal mass. Correlation with physical exam findings is recommended, and CT could be performed for further evaluation if clinically warranted.  Original Report Authenticated By: Danae Orleans, M.D.   Ct Abdomen Pelvis W Contrast  12/13/2011  *RADIOLOGY REPORT*  Clinical Data: Ileus versus bowel obstruction or mass.  CT ABDOMEN AND PELVIS WITH CONTRAST  Technique:  Multidetector CT imaging of the abdomen and pelvis was performed following the standard protocol during bolus administration of intravenous contrast.  Contrast: OMNIPAQUE IOHEXOL 300 MG/ML IV SOLN  Comparison: Plain films 12/12/2010.  CT 04/04/2011.  Findings: Heart is borderline enlarged.  No confluent opacities in the lung bases.  No effusions.  Liver, spleen, pancreas, adrenals and kidneys are unremarkable.  Again noted is severe diffuse gaseous distention of the colon, stable when compared to prior CT.  The rectum is fluid-filled.  No definite visible mass.  Findings most suggestive of severe adynamic ileus.  Small bowel is decompressed.  No free fluid, free air or adenopathy.  Aorta is normal caliber.  IMPRESSION: Severe diffuse gaseous distention of the colon without visible mass.  Findings most suggestive of severe adynamic ileus.  Original Report Authenticated By: Cyndie Chime, M.D.    Impression:  1.  Diffuse colonic dilatation, most consistent with acute on chronic worsening of colonic inertia.  Presentation not typical of Ogilvie's syndrome. 2.  Altered mental status.  Plan:  1.  Correct hypokalemia. 2.  Agree with Miralax  and metoclopramide. 3.  Rectal foley with follow-up abdominal xray tomorrow. 4.  Hopefully, rectal foley will adequately decompress him.  If not, might require endoscopic decompression. 5.  Will follow along with you.  Thank you very much for the consultation.    LOS: 5 days   Dontavion Noxon M  12/14/2011, 3:49 PM

## 2011-12-14 NOTE — Progress Notes (Signed)
Subjective: Abdomen is grossly distended today, and patient is restless.  Objective: Vital signs in last 24 hours: Temp:  [98.3 F (36.8 C)-98.4 F (36.9 C)] 98.3 F (36.8 C) (01/04 1405) Pulse Rate:  [18-79] 79  (01/04 1405) Resp:  [18-20] 18  (01/04 1405) BP: (133-147)/(85-88) 147/88 mmHg (01/04 1405) SpO2:  [94 %-98 %] 98 % (01/04 1405) Weight change:  Last BM Date: 12/14/11  Intake/Output from previous day: 01/03 0701 - 01/04 0700 In: 2307.5 [P.O.:540; I.V.:567.5; NG/GT:1200] Out: 626 [Urine:375; Emesis/NG output:250; Stool:1] Total I/O In: -  Out: 200 [Urine:200]   Physical Exam: General: Alert, not communicative, not short of breath at rest.  HEENT:  Mild clinical pallor, no jaundice, no conjunctival injection or discharge. Hydration status is fair. NECK:  Supple, JVP not seen, no carotid bruits, no palpable lymphadenopathy, no palpable goiter. CHEST:  Clinically clear to auscultation, no wheezes, no crackles. HEART:  Sounds 1 and 2 heard, normal, regular, no murmurs. ABDOMEN:  Markedly distended, tympanitic, tinkling bowel sounds. GENITALIA:  Not examined. LOWER EXTREMITIES:  No pitting edema, palpable peripheral pulses. MUSCULOSKELETAL SYSTEM:  Unremarkable. CENTRAL NERVOUS SYSTEM:  No focal neurologic deficit on gross examination.  Lab Results:  Basename 12/14/11 0410 12/13/11 0355  WBC 9.8 8.3  HGB 11.4* 10.8*  HCT 34.3* 32.7*  PLT 333 338    Basename 12/14/11 1109 12/14/11 0410  NA 135 135  K 2.6* 2.4*  CL 104 104  CO2 20 19  GLUCOSE 66* 88  BUN 6 6  CREATININE 0.57 0.54  CALCIUM 9.2 8.7   Recent Results (from the past 240 hour(s))  URINE CULTURE     Status: Normal   Collection Time   12/09/11  4:11 PM      Component Value Range Status Comment   Specimen Description URINE, RANDOM   Final    Special Requests NONE   Final    Setup Time [id number]   Final    Colony Count NO GROWTH   Final    Culture NO GROWTH   Final    Report Status  12/10/2011 FINAL   Final   URINE CULTURE     Status: Normal   Collection Time   12/10/11 12:46 PM      Component Value Range Status Comment   Specimen Description URINE, RANDOM   Final    Special Requests NONE   Final    Setup Time [id number]   Final    Colony Count NO GROWTH   Final    Culture NO GROWTH   Final    Report Status 12/11/2011 FINAL   Final      Studies/Results: Dg Abd 1 View  12/13/2011  *RADIOLOGY REPORT*  Clinical Data: Abdominal distention.  Follow-up ileus.  ABDOMEN - 1 VIEW  Comparison: 12/11/2011  Findings: Diffuse colonic dilatation shows no significant interval change.  No bowel gas or stool seen in the rectum.  Differential diagnosis includes severe colonic ileus and obstructing rectal mass.  IMPRESSION: Stable diffuse colonic dilatation, which may be due to a severe colonic ileus versus distal colonic obstruction/rectal mass. Correlation with physical exam findings is recommended, and CT could be performed for further evaluation if clinically warranted.  Original Report Authenticated By: Danae Orleans, M.D.   Ct Abdomen Pelvis W Contrast  12/13/2011  *RADIOLOGY REPORT*  Clinical Data: Ileus versus bowel obstruction or mass.  CT ABDOMEN AND PELVIS WITH CONTRAST  Technique:  Multidetector CT imaging of the abdomen and pelvis was performed following the standard  protocol during bolus administration of intravenous contrast.  Contrast: OMNIPAQUE IOHEXOL 300 MG/ML IV SOLN  Comparison: Plain films 12/12/2010.  CT 04/04/2011.  Findings: Heart is borderline enlarged.  No confluent opacities in the lung bases.  No effusions.  Liver, spleen, pancreas, adrenals and kidneys are unremarkable.  Again noted is severe diffuse gaseous distention of the colon, stable when compared to prior CT.  The rectum is fluid-filled.  No definite visible mass.  Findings most suggestive of severe adynamic ileus.  Small bowel is decompressed.  No free fluid, free air or adenopathy.  Aorta is normal  caliber.  IMPRESSION: Severe diffuse gaseous distention of the colon without visible mass.  Findings most suggestive of severe adynamic ileus.  Original Report Authenticated By: Cyndie Chime, M.D.    Medications: Scheduled Meds:    . bisacodyl  10 mg Oral BID  . carvedilol  25 mg Oral BID WC  . cefTRIAXone (ROCEPHIN)  IV  1 g Intravenous Q24H  . cloNIDine  0.3 mg Transdermal Weekly  . dextrose  1 Tube Oral Once  . dextrose      . enoxaparin  40 mg Subcutaneous Q24H  . insulin aspart  0-15 Units Subcutaneous TID WC  . insulin aspart  0-5 Units Subcutaneous QHS  . lisinopril  40 mg Oral Daily  . LORazepam      . LORazepam  1 mg Intravenous Once  . metoCLOPramide (REGLAN) injection  10 mg Intravenous Q6H  . multivitamins ther. w/minerals  1 tablet Oral Daily  . polyethylene glycol  17 g Oral BID  . polyethylene glycol  17 g Oral Once  . potassium chloride  10 mEq Intravenous Q1 Hr x 2  . potassium chloride  40 mEq Oral TID  . sodium phosphate  1 enema Rectal Once  . DISCONTD: cloNIDine  0.2 mg Transdermal Weekly  . DISCONTD: insulin glargine  20 Units Subcutaneous Q24H  . DISCONTD: LORazepam  1 mg Oral Once   Continuous Infusions:    . dextrose 5 % and 0.9% NaCl 75 mL/hr at 12/14/11 1805  . DISCONTD: dextrose 5 % with KCl 20 mEq / L 20 mEq (12/14/11 0621)   PRN Meds:.acetaminophen, acetaminophen, hydrALAZINE, iohexol, LORazepam, morphine, ondansetron (ZOFRAN) IV, ondansetron, sodium phosphate, zolpidem, DISCONTD: hydrALAZINE, DISCONTD: hydrALAZINE, DISCONTD: LORazepam  Assessment/Plan:  Principal Problem:  *Encephalopathy: Toxic, secondary to UTI, although a component of hypertensive encephalopathy is possible. Mental status is now back to baseline.  Active Problems:  1. DM type 2 (diabetes mellitus, type 2): Hitherto, ontrolled on SSI and Lantus. Patient has had some episodes of hypoglycemia. Have DC-ed Lantus, commenced on D5/NS at 75 cc/hr, and continued SSI.  2. HTN  (hypertension): Still uncontrolled, on Coreg and Lisinopril. Will increase Clonidine patch and add prn iv hydralazine, with parameters.  3. Ileus: AXR of 12/10/10, showed worsening ileus, and AXR of 12/12/10, is not encouraging. Patient continues to be symptomatic. Abdominal CT scan was helpful in ruling out mechanical obstruction. Dr Dulce Sellar, GI was consulted, and we shall manage per his recommendations. He has ordered a rectal tube, and suggests that patient may need endoscopic decompression.  4. Hypokalemia: Still persistent. Repleting as indicated.  5. UTI (lower urinary tract infection): On Rocephin, day# 6/7. Wcc has normalized, and no pyrexia has been recorded. Urine cultures show no growth so far. We shall complete 7 days Rx.  Comment: Prn Ativan, for agitation.  LOS: 5 days   Larance Ratledge,CHRISTOPHER 12/14/2011, 6:11 PM

## 2011-12-14 NOTE — Progress Notes (Addendum)
CRITICAL VALUE ALERT  Critical value received: K 2.6  Date of notification:  12/14/11  Time of notification:  1205  Critical value read back:yes  Nurse who received alert:  MAM  MD notified (1st page):  Oti  Time of first page:  1208  MD notified (2nd page):  Time of second page:  Responding MD: Oti   MD responded:  OK for meds with sips; IV restarted  Getting ready to call

## 2011-12-14 NOTE — Progress Notes (Signed)
CT of Abdomen showed severe ileus. Notified on call NP  Edwards of result  And new orders received. Initiated restraints at 0050 with some resistance. Family member aware and at bedside. NGT inserted attached to LIWS as ordered. Placement verified by two RN's.

## 2011-12-14 NOTE — Progress Notes (Signed)
CRITICAL VALUE ALERT  Critical value received: Potassium  Date of notification:  12/14/11  Time of notification:  0638  Critical value read back:yes  Nurse who received alert:  L. Tiburcio Pea, RN  MD notified (1st page): [name] NP  Time of first page: [phone]  MD notified (2nd page):   Time of second page:  Responding MD:  [name]  Time MD responded:  [phone]

## 2011-12-15 ENCOUNTER — Inpatient Hospital Stay (HOSPITAL_COMMUNITY): Payer: Medicare Other

## 2011-12-15 LAB — BASIC METABOLIC PANEL
CO2: 21 mEq/L (ref 19–32)
Chloride: 111 mEq/L (ref 96–112)
Creatinine, Ser: 0.61 mg/dL (ref 0.50–1.10)
Sodium: 139 mEq/L (ref 135–145)

## 2011-12-15 LAB — GLUCOSE, CAPILLARY
Glucose-Capillary: 85 mg/dL (ref 70–99)
Glucose-Capillary: 101 mg/dL — ABNORMAL HIGH (ref 70–99)
Glucose-Capillary: 127 mg/dL — ABNORMAL HIGH (ref 70–99)

## 2011-12-15 LAB — CBC
MCV: 85.2 fL (ref 78.0–100.0)
Platelets: 342 10*3/uL (ref 150–400)
RBC: 3.86 MIL/uL — ABNORMAL LOW (ref 3.87–5.11)
WBC: 8.6 10*3/uL (ref 4.0–10.5)

## 2011-12-15 MED ORDER — POTASSIUM CHLORIDE CRYS ER 20 MEQ PO TBCR
40.0000 meq | EXTENDED_RELEASE_TABLET | Freq: Three times a day (TID) | ORAL | Status: DC
  Administered 2011-12-15 – 2011-12-17 (×8): 40 meq via ORAL
  Filled 2011-12-15 (×14): qty 2

## 2011-12-15 NOTE — Progress Notes (Signed)
0800 Nurse tech assisted pt OOB. Rectal tube out when got OOB. Will leave out temporarily while in chair. Pt denies abd discomfort and no pain or discomfort noted per facial expression  With abd palpation.  TR  1130  Dr. Brien Few notified of rectal tube out.  States to notify GI doctor when rounds.  TR  1400 Dr. Bosie Clos with GI notified of XR results and rectal tube.  New orders obtained. TR

## 2011-12-15 NOTE — Progress Notes (Signed)
Patient ID: [name], female   DOB: 1951-06-25, 61 y.o.   MRN: [id number] [facility] Progress Note  [name] 61 y.o. 1951-07-11   Subjective: Sitting in chair doing puzzles. Denies abdominal pain although difficult to assess with mental retardation.  Objective: Vital signs: Filed Vitals:   12/15/11 0456  BP: 128/69  Pulse: 69  Temp: 97.2 F (36.2 C)  Resp: 18    Intake/Output last 24 hrs:  Intake/Output Summary (Last 24 hours) at 12/15/11 1411 Last data filed at 12/15/11 0900  Gross per 24 hour  Intake    900 ml  Output   1800 ml  Net   -900 ml    Physical Exam: Gen: alert, no acute distress  Abd: soft, nontender, nondistended  Lab Results:  [facility] 12/15/11 0520 12/14/11 1109 12/13/11 0355  NA 139 135 --  K 2.8* 2.6* --  CL 111 104 --  CO2 21 20 --  GLUCOSE 88 66* --  BUN 5* 6 --  CREATININE 0.61 0.57 --  CALCIUM 8.6 9.2 --  MG -- -- 1.9  PHOS -- -- --    Basename 12/14/11 1109 12/14/11 0410  AST 25 19  ALT 25 20  ALKPHOS 89 72  BILITOT 0.4 0.3  PROT 7.4 6.2  ALBUMIN 3.5 2.9*    Basename 12/15/11 0520 12/14/11 0410  WBC 8.6 9.8  NEUTROABS -- --  HGB 11.0* 11.4*  HCT 32.9* 34.3*  MCV 85.2 84.5  PLT 342 333     Assessment/Plan: Resolved ileus on Xray/clinically. Advance diet as tolerated. Follow electrolytes. Leave rectal tube out. Call us back if needed.  [name] 12/15/2011, 2:11 PM

## 2011-12-15 NOTE — Progress Notes (Signed)
Subjective: No new issues. Abdomen is back to normal, after rectal tube on 12/14/2011.  Objective: Vital signs in last 24 hours: Temp:  [97.2 F (36.2 C)-98.3 F (36.8 C)] 97.2 F (36.2 C) (01/05 0456) Pulse Rate:  [65-79] 69  (01/05 0456) Resp:  [18] 18  (01/05 0456) BP: (128-147)/(69-88) 128/69 mmHg (01/05 0456) SpO2:  [97 %-100 %] 97 % (01/05 0456) Weight change:  Last BM Date: 12/15/11  Intake/Output from previous day: 01/04 0701 - 01/05 0700 In: 900 [I.V.:900] Out: 1850 [Urine:750; Stool:1100] Total I/O In: 0  Out: 150 [Urine:150]   Physical Exam: General: Comfortable, alert, sitting in chair and playing jig saw puzzle, not short of breath at rest. No vomiting. HEENT:  Mild clinical pallor, no jaundice, no conjunctival injection or discharge. Hydration status is fair. NECK:  Supple, JVP not seen, no carotid bruits, no palpable lymphadenopathy, no palpable goiter. CHEST:  Clinically clear to auscultation, no wheezes, no crackles. HEART:  Sounds 1 and 2 heard, normal, regular, no murmurs. ABDOMEN:  Full, soft, non tender. Bowel sounds heard. GENITALIA:  Not examined. LOWER EXTREMITIES:  No pitting edema, palpable peripheral pulses. MUSCULOSKELETAL SYSTEM:  Unremarkable. CENTRAL NERVOUS SYSTEM:  No focal neurologic deficit on gross examination.  Lab Results:  Basename 12/15/11 0520 12/14/11 0410  WBC 8.6 9.8  HGB 11.0* 11.4*  HCT 32.9* 34.3*  PLT 342 333    Basename 12/15/11 0520 12/14/11 1109  NA 139 135  K 2.8* 2.6*  CL 111 104  CO2 21 20  GLUCOSE 88 66*  BUN 5* 6  CREATININE 0.61 0.57  CALCIUM 8.6 9.2   Recent Results (from the past 240 hour(s))  URINE CULTURE     Status: Normal   Collection Time   12/09/11  4:11 PM      Component Value Range Status Comment   Specimen Description URINE, RANDOM   Final    Special Requests NONE   Final    Setup Time [id number]   Final    Colony Count NO GROWTH   Final    Culture NO GROWTH   Final    Report Status  12/10/2011 FINAL   Final   URINE CULTURE     Status: Normal   Collection Time   12/10/11 12:46 PM      Component Value Range Status Comment   Specimen Description URINE, RANDOM   Final    Special Requests NONE   Final    Setup Time [id number]   Final    Colony Count NO GROWTH   Final    Culture NO GROWTH   Final    Report Status 12/11/2011 FINAL   Final      Studies/Results: Ct Abdomen Pelvis W Contrast  12/13/2011  *RADIOLOGY REPORT*  Clinical Data: Ileus versus bowel obstruction or mass.  CT ABDOMEN AND PELVIS WITH CONTRAST  Technique:  Multidetector CT imaging of the abdomen and pelvis was performed following the standard protocol during bolus administration of intravenous contrast.  Contrast: OMNIPAQUE IOHEXOL 300 MG/ML IV SOLN  Comparison: Plain films 12/12/2010.  CT 04/04/2011.  Findings: Heart is borderline enlarged.  No confluent opacities in the lung bases.  No effusions.  Liver, spleen, pancreas, adrenals and kidneys are unremarkable.  Again noted is severe diffuse gaseous distention of the colon, stable when compared to prior CT.  The rectum is fluid-filled.  No definite visible mass.  Findings most suggestive of severe adynamic ileus.  Small bowel is decompressed.  No free fluid, free air  or adenopathy.  Aorta is normal caliber.  IMPRESSION: Severe diffuse gaseous distention of the colon without visible mass.  Findings most suggestive of severe adynamic ileus.  Original Report Authenticated By: Cyndie Chime, M.D.   Dg Abd 2 Views  12/15/2011  *RADIOLOGY REPORT*  Clinical Data: Ileus  ABDOMEN - 2 VIEW  Comparison: December 13, 2011  Findings: The stool and bowel gas pattern is now within normal limits.  No pneumoperitoneum.  There is no gross organomegaly. Marked degenerative changes are noted at the right hip.  The lung bases are clear.  IMPRESSION: The stool and bowel gas pattern is now within normal limits.  Original Report Authenticated By: Brandon Melnick, M.D.     Medications: Scheduled Meds:    . bisacodyl  10 mg Oral BID  . carvedilol  25 mg Oral BID WC  . cefTRIAXone (ROCEPHIN)  IV  1 g Intravenous Q24H  . cloNIDine  0.3 mg Transdermal Weekly  . dextrose  1 Tube Oral Once  . enoxaparin  40 mg Subcutaneous Q24H  . insulin aspart  0-15 Units Subcutaneous TID WC  . insulin aspart  0-5 Units Subcutaneous QHS  . lisinopril  40 mg Oral Daily  . LORazepam      . LORazepam  1 mg Intravenous Once  . metoCLOPramide (REGLAN) injection  10 mg Intravenous Q6H  . multivitamins ther. w/minerals  1 tablet Oral Daily  . polyethylene glycol  17 g Oral BID  . polyethylene glycol  17 g Oral Once  . potassium chloride  40 mEq Oral TID  . sodium phosphate  1 enema Rectal Once  . DISCONTD: cloNIDine  0.2 mg Transdermal Weekly  . DISCONTD: insulin glargine  20 Units Subcutaneous Q24H  . DISCONTD: LORazepam  1 mg Oral Once   Continuous Infusions:    . dextrose 5 % and 0.9% NaCl 75 mL/hr at 12/15/11 1013  . DISCONTD: dextrose 5 % with KCl 20 mEq / L 20 mEq (12/14/11 0621)   PRN Meds:.acetaminophen, acetaminophen, hydrALAZINE, LORazepam, morphine, ondansetron (ZOFRAN) IV, ondansetron, sodium phosphate, zolpidem, DISCONTD: hydrALAZINE, DISCONTD: hydrALAZINE, DISCONTD: LORazepam  Assessment/Plan:  Principal Problem:  *Encephalopathy: Toxic, secondary to UTI, although a component of hypertensive encephalopathy is possible. Mental status is now back to baseline.  Active Problems:  1. DM type 2 (diabetes mellitus, type 2): Hitherto, ontrolled on SSI and Lantus. Patient has had some episodes of hypoglycemia. Have DC-ed Lantus, commenced on D5/NS at 75 cc/hr, and continued SSI. CBGs are normal today.  2. HTN (hypertension): Controlled, on Coreg, Lisinopril, Clonidine patch and prn iv hydralazine, with parameters.  3. Ileus: AXR of 12/10/10, showed worsening ileus, and AXR of 12/12/10, was not encouraging. Patient continued to be symptomatic. Abdominal CT scan was  helpful in ruling out mechanical obstruction. Dr Dulce Sellar, GI was consulted, and ordered a rectal tube, for decompression, with impressive results. Perhaps, endoscopic decompression will not be required. Defer further management, to GI.  4. Hypokalemia: Still persistent. Repleting as indicated.  5. UTI (lower urinary tract infection): On Rocephin, day# 7/7. Wcc has normalized, and no pyrexia has been recorded. Urine cultures show no growth so far. We shall complete treatment today.  Comment: Prn Ativan, for agitation.  LOS: 6 days   Amy Gothard,CHRISTOPHER 12/15/2011, 11:33 AM

## 2011-12-16 LAB — CBC
Hemoglobin: 10.6 g/dL — ABNORMAL LOW (ref 12.0–15.0)
RBC: 3.75 MIL/uL — ABNORMAL LOW (ref 3.87–5.11)
WBC: 8.8 10*3/uL (ref 4.0–10.5)

## 2011-12-16 LAB — BASIC METABOLIC PANEL
CO2: 20 mEq/L (ref 19–32)
Glucose, Bld: 119 mg/dL — ABNORMAL HIGH (ref 70–99)
Potassium: 3.5 mEq/L (ref 3.5–5.1)
Sodium: 138 mEq/L (ref 135–145)

## 2011-12-16 LAB — GLUCOSE, CAPILLARY
Glucose-Capillary: 118 mg/dL — ABNORMAL HIGH (ref 70–99)
Glucose-Capillary: 129 mg/dL — ABNORMAL HIGH (ref 70–99)
Glucose-Capillary: 99 mg/dL (ref 70–99)
Glucose-Capillary: 99 mg/dL (ref 70–99)

## 2011-12-16 MED ORDER — INSULIN GLARGINE 100 UNIT/ML ~~LOC~~ SOLN
20.0000 [IU] | Freq: Every day | SUBCUTANEOUS | Status: DC
  Administered 2011-12-17: 20 [IU] via SUBCUTANEOUS
  Filled 2011-12-16: qty 3

## 2011-12-16 MED ORDER — INSULIN ASPART 100 UNIT/ML ~~LOC~~ SOLN
0.0000 [IU] | Freq: Three times a day (TID) | SUBCUTANEOUS | Status: DC
  Filled 2011-12-16: qty 3

## 2011-12-16 MED ORDER — METOCLOPRAMIDE HCL 10 MG PO TABS
10.0000 mg | ORAL_TABLET | Freq: Three times a day (TID) | ORAL | Status: DC
  Administered 2011-12-16 – 2011-12-17 (×4): 10 mg via ORAL
  Filled 2011-12-16 (×9): qty 1

## 2011-12-16 MED ORDER — INSULIN ASPART 100 UNIT/ML ~~LOC~~ SOLN
0.0000 [IU] | Freq: Every day | SUBCUTANEOUS | Status: DC
  Administered 2011-12-16: 2 [IU] via SUBCUTANEOUS
  Filled 2011-12-16: qty 3

## 2011-12-16 NOTE — Discharge Summary (Addendum)
Physician Discharge Summary  Patient ID: [name] MRN: [id number] DOB/AGE: 06-02-1951 61 y.o.  Admit date: 12/09/2011 Discharge date: 12/17/2011  Primary Care Physician:  Paulino Rily, MD   Discharge Diagnoses:    Patient Active Problem List  Diagnoses  . Obstipation  . DM type 2 (diabetes mellitus, type 2)  . HTN (hypertension)  . Encephalopathy  . Ileus  . Hypokalemia  . UTI (lower urinary tract infection)    Current Discharge Medication List    START taking these medications   Details  cloNIDine (CATAPRES - DOSED IN MG/24 HR) 0.3 mg/24hr Place 1 patch (0.3 mg total) onto the skin once a week. Qty: 4 patch, Refills: 0    lisinopril (PRINIVIL,ZESTRIL) 40 MG tablet Take 1 tablet (40 mg total) by mouth daily. Qty: 30 tablet, Refills: 0    metoCLOPramide (REGLAN) 10 MG tablet Take 1 tablet (10 mg total) by mouth 4 (four) times daily -  before meals and at bedtime. Qty: 120 tablet, Refills: 0      CONTINUE these medications which have CHANGED   Details  carvedilol (COREG) 25 MG tablet Take 1 tablet (25 mg total) by mouth 2 (two) times daily with a meal. Qty: 60 tablet, Refills: 0      CONTINUE these medications which have NOT CHANGED   Details  bisacodyl (FLEET) 10 MG/30ML ENEM Place 10 mg rectally once. Taking once every 3 days    insulin glargine (LANTUS) 100 UNIT/ML injection Inject 20 Units into the skin every morning.      Multiple Vitamins-Minerals (MULTIVITAMINS THER. W/MINERALS) TABS Take 1 tablet by mouth daily.      senna-docusate (SENOKOT-S) 8.6-50 MG per tablet Take 1 tablet by mouth 2 (two) times daily. Qty: 60 tablet, Refills: 9      STOP taking these medications     lisinopril-hydrochlorothiazide (PRINZIDE,ZESTORETIC) 20-12.5 MG per tablet          Disposition and Follow-up:  Follow up with primary MD and gastroenterologist.  Consults:  GI  D. Willis Modena, [facility].  Significant Diagnostic Studies:  Ct Head  Wo Contrast  12/09/2011  *RADIOLOGY REPORT*  Clinical Data: Acute mental status changes with increased confusion.  History of hypertension and diabetes.  CT HEAD WITHOUT CONTRAST 12/09/2011:  Technique:  Contiguous axial images were obtained from the base of the skull through the vertex without contrast.  Comparison: None.  Findings: Ventricular system normal in size and appearance for age. Severe changes of small vessel disease of the white matter diffusely, including the brainstem and cerebellum.  Physiologic calcifications in the basal ganglia.  No mass lesion.  No midline shift.  No acute hemorrhage or hematoma.  No extra-axial fluid collections.  No evidence of acute infarction.  No focal osseous abnormality involving the skull.  Visualized paranasal sinuses, mastoid air cells, and middle ear cavities well- aerated.  Bilateral carotid siphon atherosclerosis.  IMPRESSION:  1.  No acute intracranial abnormality. 2.  Severe chronic microvascular ischemic changes of the white matter diffusely, including the brainstem and cerebellum.  Original Report Authenticated By: Arnell Sieving, M.D.   Dg Abd Acute W/chest  12/09/2011  *RADIOLOGY REPORT*  Clinical Data: Abdominal pain and distention.  Coumadin status changes.  ACUTE ABDOMEN SERIES (ABDOMEN 2 VIEW & CHEST 1 VIEW) 12/09/2011:  Comparison: Acute abdomen series 10/24/2011, 09/07/2011, 04/07/2011.  CT abdomen pelvis 04/07/2011.  All prior imaging [facility].  Findings: Marked gaseous distention of the colon, especially the sigmoid colon, similar to the  prior examinations.  Air-fluid levels consistent with liquid stool on the lateral decubitus image.  No evidence of free intraperitoneal air.  No significant small bowel gas.  No visible opaque urinary tract calculi.  Degenerative changes involving the lower lumbar spine and severe degenerative changes in the right hip.  Suboptimal inspiration accounts for atelectasis in the lung bases, right  greater than left.  Lungs otherwise clear.  Cardiac silhouette upper normal in size to slightly enlarged but stable. Pulmonary vascularity normal.  IMPRESSION:  1.  Recurrent severe diffuse colonic ileus, with marked enlargement of the sigmoid colon. 2.  No evidence of bowel obstruction or free intraperitoneal air. 3.  Suboptimal inspiration accounts for bibasilar atelectasis, right greater than left.  Stable borderline to mild cardiomegaly without pulmonary edema.  Original Report Authenticated By: Arnell Sieving, M.D.    Brief H and P: For complete details, refer to admission H and P. However, in brief, this is a 61 year old female, with known history of hypertension, diabetes, mental retardation and chronic colonic ileus. Patient has had multiple admissions in the past few months for recurrent ileus and abdominal distention. She now presents with increased confusion and abdominal distention. In the ER she was was found to be significantly hypertensive with blood pressures recorded at 238/125. She was admitted for further evaluation, investigation and managent.  Physical Exam: On 12/17/2011. General: Comfortable. No vomiting overnight.  HEENT: Mild clinical pallor, no jaundice, no conjunctival injection or discharge. Hydration status is satisfactory.  NECK: Supple, JVP not seen, no carotid bruits, no palpable lymphadenopathy, no palpable goiter.  CHEST: Clinically clear to auscultation, no wheezes, no crackles.  HEART: Sounds 1 and 2 heard, normal, regular, no murmurs.  ABDOMEN: Full, soft, non tender. Bowel sounds heard.  GENITALIA: Not examined.  LOWER EXTREMITIES: No pitting edema, palpable peripheral pulses.  MUSCULOSKELETAL SYSTEM: Unremarkable.  CENTRAL NERVOUS SYSTEM: No focal neurologic deficit on gross examination.  Hospital Course:  Principal Problem:  *Encephalopathy:  Patient presented with altered mental status, likely toxic etiology, secondary to UTI, although a component of  hypertensive encephalopathy is possible. Mental status as of 12/10/11, had returned to baseline.  Active Problems:  1. DM type 2 (diabetes mellitus, type 2): Hitherto,controlled on SSI and Lantus. Patient had some episodes of hypoglycemia while NPO, so Lantus was held on 12/15/11, patient was commenced on D5/NS at 75 cc/hr, and SSI continued. CBGs remained normal thereafter. Oral intake became reliable on 12/16/11, and the plan is to restart Lantus in pre-admission dose, from 12/17/2011.  2. HTN (hypertension): This proved rather difficult to control, requiring multiple antihypertensive medications, including Coreg, Lisinopril, Clonidine patch and prn iv Hydralazine, with parameters. As of 12/16/11, patient was normotensive. 3. Ileus: AXR of 12/10/10, showed worsening ileus, and AXR of 12/12/10, was not encouraging. Patient continued to be symptomatic, with constipation and vomiting. Abdominal CT scan was helpful in ruling out mechanical obstruction. Dr Dulce Sellar, GI was consulted, and ordered a rectal tube, for decompression, with impressive results. Patient as of 12/15/11, tolerated clears and moved bowels. She was advance to regular diet on 12/16/11.  4. Hypokalemia: This was persistent, requiring rather high doses of potassium supplements, but had resolved as of 12/16/2011.  5. UTI (lower urinary tract infection): Patient completed 7 days of Rocephin on 12/15/11. Wcc normalized, and no pyrexia was recorded. Urine cultures showed no growth.   Comment: Patient is stable for discharge on 12/17/2011.  Time spent on Discharge: 45 mins.  Signed: Nguyen Butler,CHRISTOPHER 12/17/2011, 5:29 PM

## 2011-12-16 NOTE — Progress Notes (Signed)
Subjective: No new issues. Abdomen is back to normal, after rectal tube on 12/14/2011, tolerating clear liquid diet, had normal bowel movement today, ambulating.  Objective: Vital signs in last 24 hours: Temp:  [98 F (36.7 C)-98.4 F (36.9 C)] 98.4 F (36.9 C) (01/06 1443) Pulse Rate:  [72-76] 76  (01/06 1443) Resp:  [18] 18  (01/06 1443) BP: (134-151)/(75-83) 151/80 mmHg (01/06 1443) SpO2:  [98 %-100 %] 98 % (01/06 1443) Weight change:  Last BM Date: 01/13/2012  Intake/Output from previous day: 12-Jan-2023 0701 - 01/06 0700 In: 2140 [P.O.:1240; I.V.:900] Out: 1500 [Urine:1500] Total I/O In: 540 [P.O.:540] Out: 551 [Urine:550; Stool:1]   Physical Exam: General: Comfortable. No vomiting overnight. HEENT:  Mild clinical pallor, no jaundice, no conjunctival injection or discharge. Hydration status is satisfactory. NECK:  Supple, JVP not seen, no carotid bruits, no palpable lymphadenopathy, no palpable goiter. CHEST:  Clinically clear to auscultation, no wheezes, no crackles. HEART:  Sounds 1 and 2 heard, normal, regular, no murmurs. ABDOMEN:  Full, soft, non tender. Bowel sounds heard. GENITALIA:  Not examined. LOWER EXTREMITIES:  No pitting edema, palpable peripheral pulses. MUSCULOSKELETAL SYSTEM:  Unremarkable. CENTRAL NERVOUS SYSTEM:  No focal neurologic deficit on gross examination.  Lab Results:  Basename 12/16/11 0601 01-13-12 0520  WBC 8.8 8.6  HGB 10.6* 11.0*  HCT 31.9* 32.9*  PLT PLATELET CLUMPS NOTED ON SMEAR, COUNT APPEARS ADEQUATE 342    Basename 12/16/11 0601 13-Jan-2012 0520  NA 138 139  K 3.5 2.8*  CL 111 111  CO2 20 21  GLUCOSE 119* 88  BUN 5* 5*  CREATININE 0.55 0.61  CALCIUM 8.4 8.6   Recent Results (from the past 240 hour(s))  URINE CULTURE     Status: Normal   Collection Time   12/09/11  4:11 PM      Component Value Range Status Comment   Specimen Description URINE, RANDOM   Final    Special Requests NONE   Final    Setup Time [id number]   Final     Colony Count NO GROWTH   Final    Culture NO GROWTH   Final    Report Status 12/10/2011 FINAL   Final   URINE CULTURE     Status: Normal   Collection Time   12/10/11 12:46 PM      Component Value Range Status Comment   Specimen Description URINE, RANDOM   Final    Special Requests NONE   Final    Setup Time [id number]   Final    Colony Count NO GROWTH   Final    Culture NO GROWTH   Final    Report Status 12/11/2011 FINAL   Final      Studies/Results: Dg Abd 2 Views  2012-01-13  *RADIOLOGY REPORT*  Clinical Data: Ileus  ABDOMEN - 2 VIEW  Comparison: December 13, 2011  Findings: The stool and bowel gas pattern is now within normal limits.  No pneumoperitoneum.  There is no gross organomegaly. Marked degenerative changes are noted at the right hip.  The lung bases are clear.  IMPRESSION: The stool and bowel gas pattern is now within normal limits.  Original Report Authenticated By: Brandon Melnick, M.D.    Medications: Scheduled Meds:    . bisacodyl  10 mg Oral BID  . carvedilol  25 mg Oral BID WC  . cefTRIAXone (ROCEPHIN)  IV  1 g Intravenous Q24H  . cloNIDine  0.3 mg Transdermal Weekly  . enoxaparin  40 mg Subcutaneous Q24H  .  insulin aspart  0-15 Units Subcutaneous TID WC  . insulin aspart  0-5 Units Subcutaneous QHS  . lisinopril  40 mg Oral Daily  . LORazepam  1 mg Intravenous Once  . metoCLOPramide (REGLAN) injection  10 mg Intravenous Q6H  . multivitamins ther. w/minerals  1 tablet Oral Daily  . polyethylene glycol  17 g Oral BID  . polyethylene glycol  17 g Oral Once  . potassium chloride  40 mEq Oral TID  . sodium phosphate  1 enema Rectal Once   Continuous Infusions:    . dextrose 5 % and 0.9% NaCl 75 mL/hr at 12/16/11 0419   PRN Meds:.acetaminophen, acetaminophen, hydrALAZINE, LORazepam, morphine, ondansetron (ZOFRAN) IV, ondansetron, sodium phosphate, zolpidem  Assessment/Plan:  Principal Problem:  *Encephalopathy: Toxic, secondary to UTI, although a  component of hypertensive encephalopathy is possible. Mental status is now back to baseline.  Active Problems:  1. DM type 2 (diabetes mellitus, type 2): Hitherto,controlled on SSI and Lantus. Patient has had some episodes of hypoglycemia while NPO, so DC-ed Lantus on 12/15/11, commenced on D5/NS at 75 cc/hr, and continued SSI. CBGs are normal today, and oral intake is reliable. Will restart Lantus in pre-admission dose, from 12/17/2011.  2. HTN (hypertension): Controlled, on Coreg, Lisinopril, Clonidine patch and prn iv hydralazine, with parameters.  3. Ileus: AXR of 12/10/10, showed worsening ileus, and AXR of 12/12/10, was not encouraging. Patient continued to be symptomatic. Abdominal CT scan was helpful in ruling out mechanical obstruction. Dr Dulce Sellar, GI was consulted, and ordered a rectal tube, for decompression, with impressive results. Patient tolerating clears, moved bowels. Will advance diet to carb-modified, todayI.  4. Hypokalemia: Resolved. Repleting as indicated.  5. UTI (lower urinary tract infection): Completed Rocephin, day# 7/7 on 12/15/11. Wcc has normalized, and no pyrexia has been recorded. Urine cultures show no growth so far.  Comment: Will aim discharge on 12/17/2011.  LOS: 7 days   Carmeron Heady,CHRISTOPHER 12/16/2011, 3:39 PM

## 2011-12-17 LAB — GLUCOSE, CAPILLARY
Glucose-Capillary: 100 mg/dL — ABNORMAL HIGH (ref 70–99)
Glucose-Capillary: 115 mg/dL — ABNORMAL HIGH (ref 70–99)
Glucose-Capillary: 99 mg/dL (ref 70–99)

## 2011-12-17 LAB — BASIC METABOLIC PANEL
BUN: 3 mg/dL — ABNORMAL LOW (ref 6–23)
Chloride: 111 mEq/L (ref 96–112)
GFR calc Af Amer: >90 mL/min (ref >90)
GFR calc non Af Amer: >90 mL/min (ref >90)
Glucose, Bld: 94 mg/dL (ref 70–99)
Potassium: 4.1 mEq/L (ref 3.5–5.1)
Sodium: 138 mEq/L (ref 135–145)

## 2011-12-17 MED ORDER — METOCLOPRAMIDE HCL 10 MG PO TABS: 10.0000 mg | ORAL_TABLET | Freq: Three times a day (TID) | ORAL | Status: DC

## 2011-12-17 MED ORDER — CLONIDINE HCL 0.3 MG/24HR TD PTWK: 1.0000 | MEDICATED_PATCH | TRANSDERMAL | Status: DC

## 2011-12-17 MED ORDER — CARVEDILOL 25 MG PO TABS: 25.0000 mg | ORAL_TABLET | Freq: Two times a day (BID) | ORAL | Status: DC

## 2011-12-17 MED ORDER — LISINOPRIL 40 MG PO TABS: 40.0000 mg | ORAL_TABLET | Freq: Every day | ORAL | Status: DC

## 2011-12-17 NOTE — Progress Notes (Signed)
1-7  NSG:  Sister reports that pt had 3 BM's in total on Sunday, soft, brown and pt is noted to be tolerating her diet.

## 2012-02-26 ENCOUNTER — Inpatient Hospital Stay (HOSPITAL_COMMUNITY)
Admission: EM | Admit: 2012-02-26 | Discharge: 2012-02-28 | DRG: 390 | Disposition: A | Discharge status: Home / Self Care | Payer: Medicare Other | Attending: Family Medicine | Admitting: Family Medicine

## 2012-02-26 ENCOUNTER — Emergency Department (HOSPITAL_COMMUNITY): Payer: Medicare Other

## 2012-02-26 ENCOUNTER — Encounter (HOSPITAL_COMMUNITY): Payer: Self-pay | Admitting: Emergency Medicine

## 2012-02-26 DIAGNOSIS — K560 Paralytic ileus: Principal | ICD-10-CM | POA: Diagnosis present

## 2012-02-26 DIAGNOSIS — K5900 Constipation, unspecified: Secondary | ICD-10-CM

## 2012-02-26 DIAGNOSIS — E119 Type 2 diabetes mellitus without complications: Secondary | ICD-10-CM | POA: Diagnosis present

## 2012-02-26 DIAGNOSIS — K567 Ileus, unspecified: Secondary | ICD-10-CM | POA: Diagnosis present

## 2012-02-26 DIAGNOSIS — E876 Hypokalemia: Secondary | ICD-10-CM | POA: Diagnosis present

## 2012-02-26 DIAGNOSIS — Z8673 Personal history of transient ischemic attack (TIA), and cerebral infarction without residual deficits: Secondary | ICD-10-CM

## 2012-02-26 DIAGNOSIS — I10 Essential (primary) hypertension: Secondary | ICD-10-CM | POA: Diagnosis present

## 2012-02-26 LAB — DIFFERENTIAL
Basophils Relative: 0 % (ref 0–1)
Lymphocytes Relative: 14 % (ref 12–46)
Monocytes Absolute: 0.8 10*3/uL (ref 0.1–1.0)
Monocytes Relative: 9 % (ref 3–12)
Neutro Abs: 6.2 10*3/uL (ref 1.7–7.7)

## 2012-02-26 LAB — CBC
Hemoglobin: 12.7 g/dL (ref 12.0–15.0)
MCH: 28.5 pg (ref 26.0–34.0)
MCHC: 32.8 g/dL (ref 30.0–36.0)
Platelets: 345 10*3/uL (ref 150–400)
RBC: 4.45 MIL/uL (ref 3.87–5.11)

## 2012-02-26 LAB — COMPREHENSIVE METABOLIC PANEL
BUN: 10 mg/dL (ref 6–23)
CO2: 26 mEq/L (ref 19–32)
Chloride: 102 mEq/L (ref 96–112)
Creatinine, Ser: 0.55 mg/dL (ref 0.50–1.10)
GFR calc Af Amer: >90 mL/min (ref >90)
GFR calc non Af Amer: >90 mL/min (ref >90)
Glucose, Bld: 120 mg/dL — ABNORMAL HIGH (ref 70–99)
Total Bilirubin: 0.5 mg/dL (ref 0.3–1.2)

## 2012-02-26 LAB — GLUCOSE, CAPILLARY
Glucose-Capillary: 148 mg/dL — ABNORMAL HIGH (ref 70–99)
Glucose-Capillary: 85 mg/dL (ref 70–99)

## 2012-02-26 MED ORDER — HEPARIN SODIUM (PORCINE) 5000 UNIT/ML IJ SOLN
5000.0000 [IU] | Freq: Three times a day (TID) | INTRAMUSCULAR | Status: DC
  Administered 2012-02-27 – 2012-02-28 (×5): 5000 [IU] via SUBCUTANEOUS
  Filled 2012-02-26 (×9): qty 1

## 2012-02-26 MED ORDER — ACETAMINOPHEN 325 MG PO TABS
650.0000 mg | ORAL_TABLET | Freq: Four times a day (QID) | ORAL | Status: DC | PRN
  Administered 2012-02-28: 650 mg via ORAL
  Filled 2012-02-26: qty 2

## 2012-02-26 MED ORDER — SENNOSIDES-DOCUSATE SODIUM 8.6-50 MG PO TABS
1.0000 | ORAL_TABLET | Freq: Two times a day (BID) | ORAL | Status: DC
  Administered 2012-02-26 – 2012-02-28 (×5): 1 via ORAL
  Filled 2012-02-26 (×7): qty 1

## 2012-02-26 MED ORDER — SODIUM CHLORIDE 0.9 % IV SOLN: INTRAVENOUS | Status: DC

## 2012-02-26 MED ORDER — CARVEDILOL 25 MG PO TABS
25.0000 mg | ORAL_TABLET | Freq: Two times a day (BID) | ORAL | Status: DC
  Administered 2012-02-27 – 2012-02-28 (×3): 25 mg via ORAL
  Filled 2012-02-26 (×5): qty 1

## 2012-02-26 MED ORDER — SODIUM CHLORIDE 0.9 % IV SOLN
Freq: Once | INTRAVENOUS | Status: AC
  Administered 2012-02-26: 11:00:00 via INTRAVENOUS

## 2012-02-26 MED ORDER — ACETAMINOPHEN 650 MG RE SUPP: 650.0000 mg | Freq: Four times a day (QID) | RECTAL | Status: DC | PRN

## 2012-02-26 MED ORDER — ONDANSETRON HCL 4 MG/2ML IJ SOLN: 4.0000 mg | Freq: Four times a day (QID) | INTRAMUSCULAR | Status: DC | PRN

## 2012-02-26 MED ORDER — METOCLOPRAMIDE HCL 5 MG/ML IJ SOLN
5.0000 mg | Freq: Three times a day (TID) | INTRAMUSCULAR | Status: DC
  Administered 2012-02-26 – 2012-02-27 (×4): 5 mg via INTRAVENOUS
  Filled 2012-02-26 (×2): qty 1
  Filled 2012-02-26: qty 2
  Filled 2012-02-26: qty 1
  Filled 2012-02-26: qty 2
  Filled 2012-02-26 (×4): qty 1

## 2012-02-26 MED ORDER — ADULT MULTIVITAMIN W/MINERALS CH
1.0000 | ORAL_TABLET | Freq: Every day | ORAL | Status: DC
  Administered 2012-02-27 – 2012-02-28 (×2): 1 via ORAL
  Filled 2012-02-26 (×2): qty 1

## 2012-02-26 MED ORDER — POTASSIUM CHLORIDE IN NACL 40-0.9 MEQ/L-% IV SOLN
INTRAVENOUS | Status: DC
  Administered 2012-02-27 (×2): via INTRAVENOUS
  Filled 2012-02-26 (×7): qty 1000

## 2012-02-26 MED ORDER — LORAZEPAM 2 MG/ML IJ SOLN
0.5000 mg | Freq: Four times a day (QID) | INTRAMUSCULAR | Status: DC | PRN
  Administered 2012-02-26: 0.5 mg via INTRAVENOUS
  Filled 2012-02-26: qty 1

## 2012-02-26 MED ORDER — BISACODYL 10 MG/30ML RE ENEM: 10.0000 mg | ENEMA | Freq: Once | RECTAL | Status: DC

## 2012-02-26 MED ORDER — LISINOPRIL 40 MG PO TABS
40.0000 mg | ORAL_TABLET | Freq: Every day | ORAL | Status: DC
Start: 2012-02-26 — End: 2012-02-28
  Administered 2012-02-27 – 2012-02-28 (×2): 40 mg via ORAL
  Filled 2012-02-26 (×3): qty 1

## 2012-02-26 MED ORDER — ONDANSETRON HCL 4 MG PO TABS: 4.0000 mg | ORAL_TABLET | Freq: Four times a day (QID) | ORAL | Status: DC | PRN

## 2012-02-26 NOTE — ED Notes (Signed)
Sitter at bedside with patient.

## 2012-02-26 NOTE — ED Notes (Signed)
Patient transported to X-ray 

## 2012-02-26 NOTE — ED Notes (Signed)
Pt has hx of pulling out rectal tubes.  Want to have sitter or family present when placed

## 2012-02-26 NOTE — H&P (Signed)
Hospital Admission Note Date: 02/26/2012  PCP: Paulino Rily, MD, MD  Chief Complaint: Abdominal pain and distention  History of Present Illness: This is a 61 year old with past medical history of stroke mental retardation and chronic colonic ileus with multiple admissions in the past for the same thing that comes in for abdominal pain and distention. She was having regular bowel movements. Her sister cannot get her medication and she was not on Reglan. So 2 days prior to admission she started developing abdominal distention enemas were given, smaller amounts of stool came out a day by day basis and abdomen started to get distended, and she kept complaining of abdominal pain. So she decided to bring her to the ED here in the ED the patient's vitals are stable she is complaining of abdominal pain.  She has had poor oral intake, no nausea or vomiting. No fever chills or any way she urinates or diarrhea.  Allergies: Review of patient's allergies indicates no known allergies. Past Medical History  Diagnosis Date  . Hypertension   . Stroke   . Diabetes mellitus    Prior to Admission medications   Medication Sig Start Date End Date Taking? Authorizing Provider  bisacodyl (FLEET) 10 MG/30ML ENEM Place 10 mg rectally once. Taking once every 3 days   Yes Historical Provider, MD  carvedilol (COREG) 25 MG tablet Take 1 tablet (25 mg total) by mouth 2 (two) times daily with a meal. 12/17/11 12/16/12 Yes Laveda Norman, MD  cloNIDine (CATAPRES - DOSED IN MG/24 HR) 0.3 mg/24hr Place 1 patch (0.3 mg total) onto the skin once a week. 12/17/11 12/16/12 Yes Laveda Norman, MD  insulin glargine (LANTUS) 100 UNIT/ML injection Inject 10-20 Units into the skin every morning. If sugar is 79 or greater give 10 units and anything greater then 90 pt gets 20 units   Yes Historical Provider, MD  lisinopril (PRINIVIL,ZESTRIL) 40 MG tablet Take 1 tablet (40 mg total) by mouth daily. 12/17/11 12/16/12 Yes Laveda Norman, MD  Multiple  Vitamins-Minerals (MULTIVITAMINS THER. W/MINERALS) TABS Take 1 tablet by mouth daily.     Yes Historical Provider, MD  senna-docusate (SENOKOT-S) 8.6-50 MG per tablet Take 1 tablet by mouth 2 (two) times daily. 10/27/11 10/26/12 Yes Maryruth Bun Rama, MD   Past Surgical History  Procedure Date  . No past surgeries    History reviewed. No pertinent family history. History   Social History  . Marital Status: Single    Spouse Name: N/A    Number of Children: N/A  . Years of Education: N/A   Occupational History  . Not on file.   Social History Main Topics  . Smoking status: Never Smoker   . Smokeless tobacco: Never Used  . Alcohol Use: No  . Drug Use: No  . Sexually Active: No   Other Topics Concern  . Not on file   Social History Narrative  . No narrative on file    REVIEW OF SYSTEMS:  Constitutional:  No weight loss, night sweats, Fevers, chills, fatigue.  HEENT:  No headaches, Difficulty swallowing,Tooth/dental problems,Sore throat,  No sneezing, itching, ear ache, nasal congestion, post nasal drip,  Cardio-vascular:  No chest pain, Orthopnea, PND, swelling in lower extremities, anasarca, dizziness, palpitations  GI:  No heartburn, indigestion, nausea, vomiting, diarrhea,  loss of appetite  Resp:  No shortness of breath with exertion or at rest. No excess mucus, no productive cough, No non-productive cough, No coughing up of blood.No change in color of mucus.No  wheezing.No chest wall deformity  Skin:  no rash or lesions.  GU:  no dysuria, change in color of urine, no urgency or frequency. No flank pain.  Musculoskeletal:  No joint pain or swelling. No decreased range of motion. No back pain.  Psych:  No change in mood or affect. No depression or anxiety. No memory loss.   Physical Exam: Filed Vitals:   02/26/12 0849  BP: 193/114  Pulse: 113  Resp: 20  SpO2: 100%   No intake or output data in the 24 hours ending 02/26/12 1252 BP 193/114  Pulse 113  Resp  20  SpO2 100%  General Appearance:    in no acute distress   Head:    Normocephalic, without obvious abnormality, atraumatic  Eyes:    PERRL, conjunctiva/corneas clear, EOM's intact, fundi    benign, both eyes  Ears:    Normal TM's and external ear canals, both ears  Nose:   Nares normal, septum midline, mucosa normal, no drainage    or sinus tenderness  Throat:   Lips, mucosa, and tongue normal; teeth and gums normal  Neck:   Supple, symmetrical, trachea midline, no adenopathy;    thyroid:  no enlargement/tenderness/nodules; no carotid   bruit or JVD  Back:     Symmetric, no curvature, ROM normal, no CVA tenderness  Lungs:     Clear to auscultation bilaterally, respirations unlabored  Chest Wall:    No tenderness or deformity   Heart:    Regular rate and rhythm, S1 and S2 normal, no murmur, rub   or gallop     Abdomen:     massively distended abdomen soft tender in all 4 quadrants no rebound or guarding no organomegaly.         Extremities:   Extremities normal, atraumatic, no cyanosis or edema  Pulses:   2+ and symmetric all extremities  Skin:   Skin color, texture, turgor normal, no rashes or lesions  Lymph nodes:   Cervical, supraclavicular, and axillary nodes normal  Neurologic:   CNII-XII intact, normal strength, sensation and reflexes    throughout   Lab results:  Basename 02/26/12 1010  NA 140  K 3.2*  CL 102  CO2 26  GLUCOSE 120*  BUN 10  CREATININE 0.55  CALCIUM 9.3  MG --  PHOS --    Basename 02/26/12 1010  AST 28  ALT 26  ALKPHOS 93  BILITOT 0.5  PROT 7.2  ALBUMIN 3.6   No results found for this basename: LIPASE:2,AMYLASE:2 in the last 72 hours  Basename 02/26/12 1010  WBC 8.2  NEUTROABS 6.2  HGB 12.7  HCT 38.7  MCV 87.0  PLT 345   No results found for this basename: CKTOTAL:3,CKMB:3,CKMBINDEX:3,TROPONINI:3 in the last 72 hours No components found with this basename: POCBNP:3 No results found for this basename: DDIMER:2 in the last 72  hours No results found for this basename: HGBA1C:2 in the last 72 hours No results found for this basename: CHOL:2,HDL:2,LDLCALC:2,TRIG:2,CHOLHDL:2,LDLDIRECT:2 in the last 72 hours No results found for this basename: TSH,T4TOTAL,FREET3,T3FREE,THYROIDAB in the last 72 hours No results found for this basename: VITAMINB12:2,FOLATE:2,FERRITIN:2,TIBC:2,IRON:2,RETICCTPCT:2 in the last 72 hours Imaging results:  Dg Abd Acute W/chest  02/26/2012  *RADIOLOGY REPORT*  Clinical Data: None of distention, constipation  ACUTE ABDOMEN SERIES (ABDOMEN 2 VIEW & CHEST 1 VIEW)  Comparison: Chest and acute abdomen of 12/15/2011 an CT of the abdomen pelvis of 12/13/2011  Findings: The lungs are clear.  The heart is mildly enlarged and  stable.  No bony abnormality is seen.  Significant gaseous distention of the colon is again noted. Compared to the prior CT, no obstructing lesion was evident, and this pattern again is most consistent with severe colonic ileus. 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 No free air is seen on the decubitus images.  No opaque calculi are noted.  IMPRESSION:  1.  Severe colonic ileus. In review of the recent CT of the abdomen and pelvis, no definite point of obstruction was noted at that time. 2.  No free air. 3.  Mild cardiomegaly.  No active lung disease.  Original Report Authenticated By: Juline Patch, M.D.   Other results: EKG:Marland Kitchen   Patient Active Hospital Problem List: Ileus (12/09/2011) Patient was having her enemas, but she ran out of her medication. I see that she is not on Reglan. The sister who is at bedside related that she's having sure with her insurances so she couldn't get her medications. At this time we'll place a rectal tube was started on clears continue stool softeners. If no improvement in the morning we'll have to call GI to see if there is anything else we can offer her. Surgery has been called in the past as going through the records and because  of her mental retardation and probable poor family support a diverting colostomy has been deferred.  I will also try to keep her electrolytes within normal potassium greater than 4 magnesium greater than 2. We'll start her on a clear liquid diet tomorrow morning.  DM type 2 (diabetes mellitus, type 2) (10/24/2011) Restart her Lantus at a significant lower dose. We'll monitor CBGs every 4. Once patient is able eat will start her on sliding scale insulin.   HTN (hypertension) (10/24/2011) The patient has not been taking her medications for her blood pressures. We'll resume all of her blood pressure medication at this time discussion concerning this she still on clonidine which can cause  Constipation. We'll go ahead and stop it.we'll try to use another agent if needed.   Hypokalemia (12/09/2011) Replete potassium is probably secondary to decreased by mouth intake,try to keep above 4. Check a magnesium try to keep wabove 2.    Code Status:  Family Communication: Daughter [phone]   Marinda Elk M.D. [facility] [phone] 02/26/2012, 12:52 PM

## 2012-02-26 NOTE — ED Notes (Signed)
Rectal tube inserted, return of gas.  Attached empty foley bag to end

## 2012-02-26 NOTE — ED Provider Notes (Cosign Needed)
History     CSN: [id number]  Arrival date & time 02/26/12  2130   First MD Initiated Contact with Patient 02/26/12 [phone]      Chief Complaint  Patient presents with  . Abdominal Pain    (Consider location/radiation/quality/duration/timing/severity/associated sxs/prior treatment) Patient is a 61 y.o. female presenting with abdominal pain. The history is provided by the patient and a relative.  Abdominal Pain The primary symptoms of the illness include abdominal pain.   patient here with abdominal distention and nausea. Patient has a history of constipation has not had a bowel movement for approximately one week. Has been made in the past for similar symptoms. Has a history of colonic dysmotility. Her caregiver has used an enema today without relief. No fever or dyspnea. Nothing makes her symptoms were  Past Medical History  Diagnosis Date  . Hypertension   . Stroke   . Diabetes mellitus     History reviewed. No pertinent past surgical history.  No family history on file.  History  Substance Use Topics  . Smoking status: Never Smoker   . Smokeless tobacco: Never Used  . Alcohol Use: No    OB History    Grav Para Term Preterm Abortions TAB SAB Ect Mult Living                  Review of Systems  Gastrointestinal: Positive for abdominal pain.  All other systems reviewed and are negative.    Allergies  Review of patient's allergies indicates no known allergies.  Home Medications   Current Outpatient Rx  Name Route Sig Dispense Refill  . BISACODYL 10 MG/30ML RE ENEM Rectal Place 10 mg rectally once. Taking once every 3 days    . CARVEDILOL 25 MG PO TABS Oral Take 1 tablet (25 mg total) by mouth 2 (two) times daily with a meal. 60 tablet 0  . CLONIDINE HCL 0.3 MG/24HR TD PTWK Transdermal Place 1 patch (0.3 mg total) onto the skin once a week. 4 patch 0  . INSULIN GLARGINE 100 UNIT/ML [place] SOLN Subcutaneous Inject 20 Units into the skin every morning.      Marland Kitchen  LISINOPRIL 40 MG PO TABS Oral Take 1 tablet (40 mg total) by mouth daily. 30 tablet 0  . THERA M PLUS PO TABS Oral Take 1 tablet by mouth daily.      Bernadette Hoit SODIUM 8.6-50 MG PO TABS Oral Take 1 tablet by mouth 2 (two) times daily. 60 tablet 9    BP 193/114  Pulse 113  Resp 20  SpO2 100%  Physical Exam  Nursing note and vitals reviewed. Constitutional: She is oriented to person, place, and time. She appears well-developed and well-nourished.  Non-toxic appearance. No distress.  HENT:  Head: Normocephalic and atraumatic.  Eyes: Conjunctivae, EOM and lids are normal. Pupils are equal, round, and reactive to light.  Neck: Normal range of motion. Neck supple. No tracheal deviation present. No mass present.  Cardiovascular: Regular rhythm and normal heart sounds.  Tachycardia present.  Exam reveals no gallop.   No murmur heard. Pulmonary/Chest: Effort normal and breath sounds normal. No stridor. No respiratory distress. She has no decreased breath sounds. She has no wheezes. She has no rhonchi. She has no rales.  Abdominal: Soft. Normal appearance and bowel sounds are normal. She exhibits distension. There is no tenderness. There is no rigidity, no rebound, no guarding and no CVA tenderness.  Genitourinary: Rectal exam shows no mass, no tenderness and anal tone normal.  No stool in rectal vault  Musculoskeletal: Normal range of motion. She exhibits no edema and no tenderness.  Neurological: She is alert and oriented to person, place, and time. She has normal strength. No cranial nerve deficit or sensory deficit. GCS eye subscore is 4. GCS verbal subscore is 5. GCS motor subscore is 6.  Skin: Skin is warm and dry. No abrasion and no rash noted.  Psychiatric: She has a normal mood and affect. Her speech is normal and behavior is normal.    ED Course  Procedures (including critical care time)   Labs Reviewed  CBC  DIFFERENTIAL  COMPREHENSIVE METABOLIC PANEL   No  results found.   No diagnosis found.    MDM  Patient given IV fluids and will be admitted        Toy Baker, MD 02/26/12 1128  Toy Baker, MD 02/26/12 1130

## 2012-02-26 NOTE — ED Notes (Signed)
Pt has a hx of constipation and has not had a bp for over 1 wk, abd pain was given soap suds at stool softner for the past few days, no vomiting. abd distended

## 2012-02-27 ENCOUNTER — Inpatient Hospital Stay (HOSPITAL_COMMUNITY): Payer: Medicare Other

## 2012-02-27 LAB — COMPREHENSIVE METABOLIC PANEL
Alkaline Phosphatase: 78 U/L (ref 39–117)
BUN: 6 mg/dL (ref 6–23)
CO2: 25 mEq/L (ref 19–32)
Chloride: 106 mEq/L (ref 96–112)
Creatinine, Ser: 0.5 mg/dL (ref 0.50–1.10)
GFR calc Af Amer: >90 mL/min (ref >90)
GFR calc non Af Amer: >90 mL/min (ref >90)
Glucose, Bld: 81 mg/dL (ref 70–99)
Potassium: 3.6 mEq/L (ref 3.5–5.1)
Total Bilirubin: 0.3 mg/dL (ref 0.3–1.2)

## 2012-02-27 LAB — GLUCOSE, CAPILLARY
Glucose-Capillary: 101 mg/dL — ABNORMAL HIGH (ref 70–99)
Glucose-Capillary: 127 mg/dL — ABNORMAL HIGH (ref 70–99)
Glucose-Capillary: 101 mg/dL — ABNORMAL HIGH (ref 70–99)

## 2012-02-27 MED ORDER — INSULIN GLARGINE 100 UNIT/ML ~~LOC~~ SOLN
10.0000 [IU] | Freq: Every day | SUBCUTANEOUS | Status: DC
  Administered 2012-02-27 – 2012-02-28 (×2): 10 [IU] via SUBCUTANEOUS

## 2012-02-27 NOTE — Progress Notes (Signed)
Pt pulled out PIV. Spoke on on call provider. Order to leave PIV out, IVF d/c'd and to encourage PO fluid intake. Will monitor KCL since IVF was NS +20KCL. Plan to d/c pt tomorrow. Will continue to monitor. Eugene Garnet [facility] 02/27/2012 8:23 PM

## 2012-02-27 NOTE — Progress Notes (Signed)
Talked to patient's sister [name] about problems getting the patient's medication. The patient has Medicare and Medicaid as a payer source. The pt's sister states that Medicare has changed her card and the new one is coming in the mail.  Patient lives with her sister and she is the primary care giver for the patient. Informed [name] that [name] has a list of $4.00 medications that could help her until she receives her medicaid card in the mail. Patient' sister states that the patient is active, no home health care needs at this time. Cynthia Derrick RN, BNS, MHA

## 2012-02-27 NOTE — Progress Notes (Signed)
[facility] Inpatient Progress Note  02/27/2012  Subjective: Pt is doing much better today.  Rectal tube is working well and abdomen less distended.  Xray shows improvement.  Pt's caretaker says that she is having difficulty getting pt's home meds at this time.    Objective:  Vital signs in last 24 hours: Filed Vitals:   02/26/12 1903 02/26/12 2209 02/27/12 0053 02/27/12 0559  BP: 143/67 118/76  158/89  Pulse: 84 80  73  Temp: 98.2 F (36.8 C) 98.2 F (36.8 C)  98.2 F (36.8 C)  TempSrc: Oral Oral  Oral  Resp:  18  18  Height:   5\' 2"  (1.575 m)   Weight:   49.896 kg (110 lb)   SpO2: 99% 97%  100%   Weight change:   Intake/Output Summary (Last 24 hours) at 02/27/12 1117 Last data filed at 02/26/12 2300  Gross per 24 hour  Intake      0 ml  Output      0 ml  Net      0 ml   Lab Results  Component Value Date   HGBA1C 6.4* 05/18/2011   HGBA1C  Value: 6.7 (NOTE)                                                                       According to the ADA Clinical Practice Recommendations for 2011, when HbA1c is used as a screening test:   >=6.5%   Diagnostic of Diabetes Mellitus           (if abnormal result  is confirmed)  5.7-6.4%   Increased risk of developing Diabetes Mellitus  References:Diagnosis and Classification of Diabetes Mellitus,Diabetes Care,2011,34(Suppl 1):S62-S69 and Standards of Medical Care in         Diabetes - 2011,Diabetes Care,2011,34  (Suppl 1):S11-S61.* 04/08/2011   HGBA1C  Value: 12.3 (NOTE) The ADA recommends the following therapeutic goal for glycemic control related to Hgb A1c measurement: Goal of therapy: <6.5 Hgb A1c  Reference: American Diabetes Association: Clinical Practice Recommendations 2010, Diabetes Care, 2010, 33: (Suppl  1).* 11/30/2009   Lab Results  Component Value Date   LDLCALC  Value: 84        Total Cholesterol/HDL:CHD Risk Coronary Heart Disease Risk Table                     Men   Women  1/2 Average Risk   3.4   3.3  Average Risk        5.0   4.4  2 X Average Risk   9.6   7.1  3 X Average Risk  23.4   11.0        Use the calculated Patient Ratio above and the CHD Risk Table to determine the patient's CHD Risk.        ATP III CLASSIFICATION (LDL):  <100     mg/dL   Optimal  960-454  mg/dL   Near or Above                    Optimal  130-159  mg/dL   Borderline  098-119  mg/dL   High  >147     mg/dL   Very High 82/95/6213  CREATININE 0.50 02/27/2012    Review of Systems As above, otherwise all reviewed and reported negative  Physical Exam Gen - awake, alert, NAD, cooperative, confused Lungs - BBS clear CV - normal s1, s2 sounds Abd - mild distension, active BS, no masses palpated, no guarding Ext - no cyanosis  Lab Results: Results for orders placed during the hospital encounter of 02/26/12 (from the past 24 hour(s))  GLUCOSE, CAPILLARY     Status: Normal   Collection Time   02/26/12  8:17 PM      Component Value Range   Glucose-Capillary 85  70 - 99 (mg/dL)  GLUCOSE, CAPILLARY     Status: Abnormal   Collection Time   02/26/12 10:32 PM      Component Value Range   Glucose-Capillary 139 (*) 70 - 99 (mg/dL)  COMPREHENSIVE METABOLIC PANEL     Status: Abnormal   Collection Time   02/27/12  3:53 AM      Component Value Range   Sodium 141  135 - 145 (mEq/L)   Potassium 3.6  3.5 - 5.1 (mEq/L)   Chloride 106  96 - 112 (mEq/L)   CO2 25  19 - 32 (mEq/L)   Glucose, Bld 81  70 - 99 (mg/dL)   BUN 6  6 - 23 (mg/dL)   Creatinine, Ser 0.45  0.50 - 1.10 (mg/dL)   Calcium 8.6  8.4 - 40.9 (mg/dL)   Total Protein 6.0  6.0 - 8.3 (g/dL)   Albumin 2.9 (*) 3.5 - 5.2 (g/dL)   AST 23  0 - 37 (U/L)   ALT 20  0 - 35 (U/L)   Alkaline Phosphatase 78  39 - 117 (U/L)   Total Bilirubin 0.3  0.3 - 1.2 (mg/dL)   GFR calc non Af Amer >90  >90 (mL/min)   GFR calc Af Amer >90  >90 (mL/min)  GLUCOSE, CAPILLARY     Status: Abnormal   Collection Time   02/27/12  7:40 AM      Component Value Range   Glucose-Capillary 101 (*) 70 - 99 (mg/dL)     Micro Results: No results found for this or any previous visit (from the past 240 hour(s)).  Medications:  Scheduled Meds:   . carvedilol  25 mg Oral BID WC  . heparin  5,000 Units Subcutaneous Q8H  . insulin glargine  10 Units Subcutaneous QAC breakfast  . lisinopril  40 mg Oral Daily  . metoCLOPramide (REGLAN) injection  5 mg Intravenous Q8H  . mulitivitamin with minerals  1 tablet Oral Daily  . senna-docusate  1 tablet Oral BID  . DISCONTD: sodium chloride   Intravenous STAT  . DISCONTD: bisacodyl  10 mg Rectal Once   Continuous Infusions:   . 0.9 % NaCl with KCl 40 mEq / L 100 mL/hr at 02/27/12 0649   PRN Meds:.acetaminophen, acetaminophen, LORazepam, ondansetron (ZOFRAN) IV, ondansetron  Assessment/Plan: Recurrent Ileus - pt is doing much better now with rectal tube in place, abdomen less distended. Resume metoclopramide. DM 2 - Pt had been without her insulin.  DM seems well controlled now.  I asked case management to assist caretaker with her home meds HTN - resuming her home medication except clonidine (because of constipation).  Follow BP and adjust as needed.  Hypokalemia - replaced and corrected at this time.  Cerebrovascular disease - currently stable.  Plan possible home tomorrow if stable, recheck KUB in am.   LOS: 1 day   Pardeep Pautz 02/27/2012, 11:17 AM  Murlean Iba, MD, CDE, [place], [place]

## 2012-02-28 ENCOUNTER — Inpatient Hospital Stay (HOSPITAL_COMMUNITY): Payer: Medicare Other

## 2012-02-28 LAB — COMPREHENSIVE METABOLIC PANEL
AST: 24 U/L (ref 0–37)
Albumin: 3 g/dL — ABNORMAL LOW (ref 3.5–5.2)
BUN: 9 mg/dL (ref 6–23)
Calcium: 8.7 mg/dL (ref 8.4–10.5)
Chloride: 103 mEq/L (ref 96–112)
Creatinine, Ser: 0.53 mg/dL (ref 0.50–1.10)
Total Bilirubin: 0.2 mg/dL — ABNORMAL LOW (ref 0.3–1.2)

## 2012-02-28 LAB — GLUCOSE, CAPILLARY: Glucose-Capillary: 130 mg/dL — ABNORMAL HIGH (ref 70–99)

## 2012-02-28 MED ORDER — CARVEDILOL 25 MG PO TABS: 25.0000 mg | ORAL_TABLET | Freq: Two times a day (BID) | ORAL | Status: DC

## 2012-02-28 MED ORDER — METFORMIN HCL ER 500 MG PO TB24: 500.0000 mg | ORAL_TABLET | Freq: Two times a day (BID) | ORAL | Status: DC

## 2012-02-28 MED ORDER — METOCLOPRAMIDE HCL 10 MG PO TABS: 10.0000 mg | ORAL_TABLET | Freq: Three times a day (TID) | ORAL | Status: DC

## 2012-02-28 MED ORDER — LISINOPRIL 20 MG PO TABS: 40.0000 mg | ORAL_TABLET | Freq: Every day | ORAL | Status: DC

## 2012-02-28 MED ORDER — LACTULOSE 10 GM/15ML PO SOLN: 20.0000 g | Freq: Two times a day (BID) | ORAL | Status: AC

## 2012-02-28 NOTE — Progress Notes (Signed)
Pt, D/C home, pt is stable and states pain is improved.D/C instructions done,  Medication instructions done, pt verbalizes understanding. Teaching instructions will be reemphasized when pt's family arrives to pick  Pt  Home. Pt is a little slow in understanding and has delayed  in response.

## 2012-02-28 NOTE — Discharge Instructions (Signed)
Constipation in Adults Constipation is having fewer than 2 bowel movements per week. Usually, the stools are hard. As we grow older, constipation is more common. If you try to fix constipation with laxatives, the problem may get worse. This is because laxatives taken over a long period of time make the colon muscles weaker. A low-fiber diet, not taking in enough fluids, and taking some medicines may make these problems worse. MEDICATIONS THAT MAY CAUSE CONSTIPATION  Water pills (diuretics).   Calcium channel blockers (used to control blood pressure and for the heart).   Certain pain medicines (narcotics).   Anticholinergics.   Anti-inflammatory agents.   Antacids that contain aluminum.  DISEASES THAT CONTRIBUTE TO CONSTIPATION  Diabetes.   Parkinson's disease.   Dementia.   Stroke.   Depression.   Illnesses that cause problems with salt and water metabolism.  HOME CARE INSTRUCTIONS   Constipation is usually best cared for without medicines. Increasing dietary fiber and eating more fruits and vegetables is the best way to manage constipation.   Slowly increase fiber intake to 25 to 38 grams per day. Whole grains, fruits, vegetables, and legumes are good sources of fiber. A dietitian can further help you incorporate high-fiber foods into your diet.   Drink enough water and fluids to keep your urine clear or pale yellow.   A fiber supplement may be added to your diet if you cannot get enough fiber from foods.   Increasing your activities also helps improve regularity.   Suppositories, as suggested by your caregiver, will also help. If you are using antacids, such as aluminum or calcium containing products, it will be helpful to switch to products containing magnesium if your caregiver says it is okay.   If you have been given a liquid injection (enema) today, this is only a temporary measure. It should not be relied on for treatment of longstanding (chronic) constipation.    Stronger measures, such as magnesium sulfate, should be avoided if possible. This may cause uncontrollable diarrhea. Using magnesium sulfate may not allow you time to make it to the bathroom.  SEEK IMMEDIATE MEDICAL CARE IF:   There is bright red blood in the stool.   The constipation stays for more than 4 days.   There is belly (abdominal) or rectal pain.   You do not seem to be getting better.   You have any questions or concerns.  MAKE SURE YOU:   Understand these instructions.   Will watch your condition.   Will get help right away if you are not doing well or get worse.  Document Released: 08/24/2004 Document Revised: 11/15/2011 Document Reviewed: 10/30/2011 [facility] Patient Information 2012 [place], [place].  Diabetes, Type 2 Diabetes is a long-lasting (chronic) disease. In type 2 diabetes, the pancreas does not make enough insulin (a hormone), and the body does not respond normally to the insulin that is made. This type of diabetes was also previously called adult-onset diabetes. It usually occurs after the age of 58, but it can occur at any age.  CAUSES  Type 2 diabetes happens because the pancreasis not making enough insulin or your body has trouble using the insulin that your pancreas does make properly. SYMPTOMS   Drinking more than usual.   Urinating more than usual.   Blurred vision.   Dry, itchy skin.   Frequent infections.   Feeling more tired than usual (fatigue).  DIAGNOSIS The diagnosis of type 2 diabetes is usually made by one of the following tests:  Fasting  blood glucose test. You will not eat for at least 8 hours and then take a blood test.   Random blood glucose test. Your blood glucose (sugar) is checked at any time of the day regardless of when you ate.   Oral glucose tolerance test (OGTT). Your blood glucose is measured after you have not eaten (fasted) and then after you drink a glucose containing beverage.  TREATMENT   Healthy eating.    Exercise.   Medicine, if needed.   Monitoring blood glucose.   Seeing your caregiver regularly.  HOME CARE INSTRUCTIONS   Check your blood glucose at least once a day. More frequent monitoring may be necessary, depending on your medicines and on how well your diabetes is controlled. Your caregiver will advise you.   Take your medicine as directed by your caregiver.   Do not smoke.   Make wise food choices. Ask your caregiver for information. Weight loss can improve your diabetes.   Learn about low blood glucose (hypoglycemia) and how to treat it.   Get your eyes checked regularly.   Have a yearly physical exam. Have your blood pressure checked and your blood and urine tested.   Wear a pendant or bracelet saying that you have diabetes.   Check your feet every night for cuts, sores, blisters, and redness. Let your caregiver know if you have any problems.  SEEK MEDICAL CARE IF:   You have problems keeping your blood glucose in target range.   You have problems with your medicines.   You have symptoms of an illness that do not improve after 24 hours.   You have a sore or wound that is not healing.   You notice a change in vision or a new problem with your vision.   You have a fever.  MAKE SURE YOU:  Understand these instructions.   Will watch your condition.   Will get help right away if you are not doing well or get worse.  Document Released: 11/26/2005 Document Revised: 11/15/2011 Document Reviewed: 05/14/2011 [facility] Patient Information 2012 [place], [place]. H

## 2012-02-28 NOTE — Discharge Summary (Signed)
HOSPITAL DISCHARGE SUMMARY   @n   [name], 61 y.o., DOB 05/02/1951, MRN [id number]  Admission date: 02/26/2012 Discharge Date: 02/28/2012  Primary MD Paulino Rily, MD, MD  Admitting Physician Marinda Elk, MD  Admission Diagnosis  Obstipation [564.00] CONSTIPATION  Discharge Diagnoses:   No resolved problems to display.  Active Hospital Problems  Diagnoses Date Noted   . Ileus 12/09/2011   . Hypokalemia 12/09/2011   . DM type 2 (diabetes mellitus, type 2) 10/24/2011   . HTN (hypertension) 10/24/2011      Past Medical History  Diagnosis Date  . Hypertension   . Stroke   . Diabetes mellitus     Past Surgical History  Procedure Date  . No past surgeries     Significant Tests:  See full reports for all details     Hospital Course See H&P, Labs, Consult and Test reports for all details. In brief, this patient was admitted for ileus and obstipation that  Had been an ongoing problem for this patient requiring periodic hospitalizations for rectal tube and for decompression.  This time patient had A distended abdomen and abdominal discomfort and had not had a BM for some time.  She had been without her meds for some time because Of some problems with her insurance card and medications.  She has had poor oral intake, no nausea or vomiting. No fever chills or any way  she urinates or diarrhea.  She was admitted and started with a rectal tube and medications.  She responded very well and her abdomen  Improved.  She had a large output from the rectal tube.   The patient pulled out the rectal tube on the morning of 3/21 and it was decided not to replace  It because she would most likely pull it out again.  She will be discharged on meds that can be gotten on the [facility] $4 list.  Close follow up Is recommended.  She will be provided with appropriate laxatives.  She was also started on metformin for her type 2 DM.  She cannot Afford lantus insulin at this time and  she was only on a very small dose of lantus and BS were well controlled.  Will have her try metformin oral and  Have her to see her PCP soon for BS follow up.  The recommendations are above.     No resolved problems to display.  Active Hospital Problems  Diagnoses Date Noted   . Ileus 12/09/2011   . Hypokalemia 12/09/2011   . DM type 2 (diabetes mellitus, type 2) 10/24/2011   . HTN (hypertension) 10/24/2011     Resolved Hospital Problems  Diagnoses Date Noted Date Resolved     Today's Assessment:   Subjective:   [name]  Pt is much more alert and awake today, eating and tolerating p.o with no nausea or vomiting.   Objective:   Blood pressure 119/76, pulse 74, temperature 98.1 F (36.7 C), temperature source Oral, resp. rate 18, height 5\' 2"  (1.575 m), weight 49.896 kg (110 lb), SpO2 99.00%.  Intake/Output Summary (Last 24 hours) at 02/28/12 0909 Last data filed at 02/28/12 0844  Gross per 24 hour  Intake    660 ml  Output    300 ml  Net    360 ml    Exam General  - awake, alert, NAD Lungs - BBS clear  CV - normal s1, s2 sounds Abd - much less distension, nontender, no masses palpated, normal BS Ext -  no cyanosis, no edema, no clubbing   Lab Results  Component Value Date   WBC 8.2 02/26/2012   HGB 12.7 02/26/2012   HCT 38.7 02/26/2012   PLT 345 02/26/2012   LYMPHOPCT 14 02/26/2012   MONOPCT 9 02/26/2012   EOSPCT 1 02/26/2012   BASOPCT 0 02/26/2012   CMP: Lab Results  Component Value Date   NA 135 02/28/2012   K 4.0 02/28/2012   CL 103 02/28/2012   CO2 24 02/28/2012   BUN 9 02/28/2012   CREATININE 0.53 02/28/2012   PROT 6.5 02/28/2012   ALBUMIN 3.0* 02/28/2012   BILITOT 0.2* 02/28/2012   ALKPHOS 91 02/28/2012   AST 24 02/28/2012   ALT 21 02/28/2012  .  Discharge Instructions    Please see Discharge AVS orders   DISCHARGE MEDICATION: Medication List  As of 02/28/2012  9:09 AM   STOP taking these medications         cloNIDine 0.3 mg/24hr      insulin  glargine 100 UNIT/ML injection      LORazepam 0.5 MG tablet         TAKE these medications         bisacodyl 10 MG/30ML Enem   Commonly known as: FLEET   Place 10 mg rectally once. Taking once every 3 days      carvedilol 25 MG tablet   Commonly known as: COREG   Take 1 tablet (25 mg total) by mouth 2 (two) times daily with a meal.                  lactulose 10 GM/15ML solution   Commonly known as: CHRONULAC   Take 30 mLs (20 g total) by mouth 2 (two) times daily.                  lisinopril 20 MG tablet   Commonly known as: PRINIVIL,ZESTRIL   Take 2 tablets (40 mg total) by mouth daily.      metFORMIN 500 MG 24 hr tablet   Commonly known as: GLUCOPHAGE-XR   Take 1 tablet (500 mg total) by mouth 2 (two) times daily with a meal.      metoCLOPramide 10 MG tablet   Commonly known as: REGLAN   Take 1 tablet (10 mg total) by mouth 3 (three) times daily before meals.      multivitamins ther. w/minerals Tabs   Take 1 tablet by mouth daily.      senna-docusate 8.6-50 MG per tablet   Commonly known as: Senokot-S   Take 1 tablet by mouth 2 (two) times daily.            Disposition and Follow-up: Discharge Orders    Future Orders Please Complete By Expires   Diet - low sodium heart healthy      Comments:   HIGH FIBER DIET RECOMMENDED.   Increase activity slowly      Discharge instructions      Comments:   CHECK BLOOD SUGAR 2 TIMES PER DAY AND REPORT READINGS TO PRIMARY CARE PROVIDER PLEASE BE SURE TO FOLLOW UP WITH PRIMARY CARE PROVIDER AS RECOMMENDED IN 3-5 DAYS RETURN IF SYMPTOMS RETURN, WORSEN OR NEW PROBLEMS DEVELOP.     Call MD for:  persistant nausea and vomiting      Call MD for:  severe uncontrolled pain      Call MD for:  difficulty breathing, headache or visual disturbances        Follow-up Information  Follow up with Paulino Rily, MD .   In 3-5 days for recheck     The risks, benefits, and possible side effects of all treatments and tests  were explained to the patient.  The patient verbalized understanding.  The importance of close follow up with the primary care medical provider was explained clearly to the patient.  The patient verbalized understanding.  The patient was given instructions to return if symptoms recur, worsen or new changes develop.  The patient verbalized understanding.   Cleora Fleet, MD, CDE, FAAFP [facility] [place], [place]   Total Time spent reviewing critical document, reviewing this patient's comprehensive hospitalization, arranging follow up and coordination of care, reviewing data and todays exam greater than 35 minutes.   Signed: Jamoni Hewes 02/28/2012 9:09 AM

## 2012-05-05 IMAGING — CR DG ABDOMEN ACUTE W/ 1V CHEST
4 series · 4 of 4 positions shown · non-contrast
Comparison: Acute abdomen series 10/24/2011, 09/07/2011,
04/07/2011.  CT abdomen pelvis 04/07/2011.  All prior imaging
[HOSPITAL].

CLINICAL DATA: Abdominal pain and distention.  Coumadin status
changes.

ACUTE ABDOMEN SERIES (ABDOMEN 2 VIEW & CHEST 1 VIEW) 12/09/2011:

[x abdomen supine]
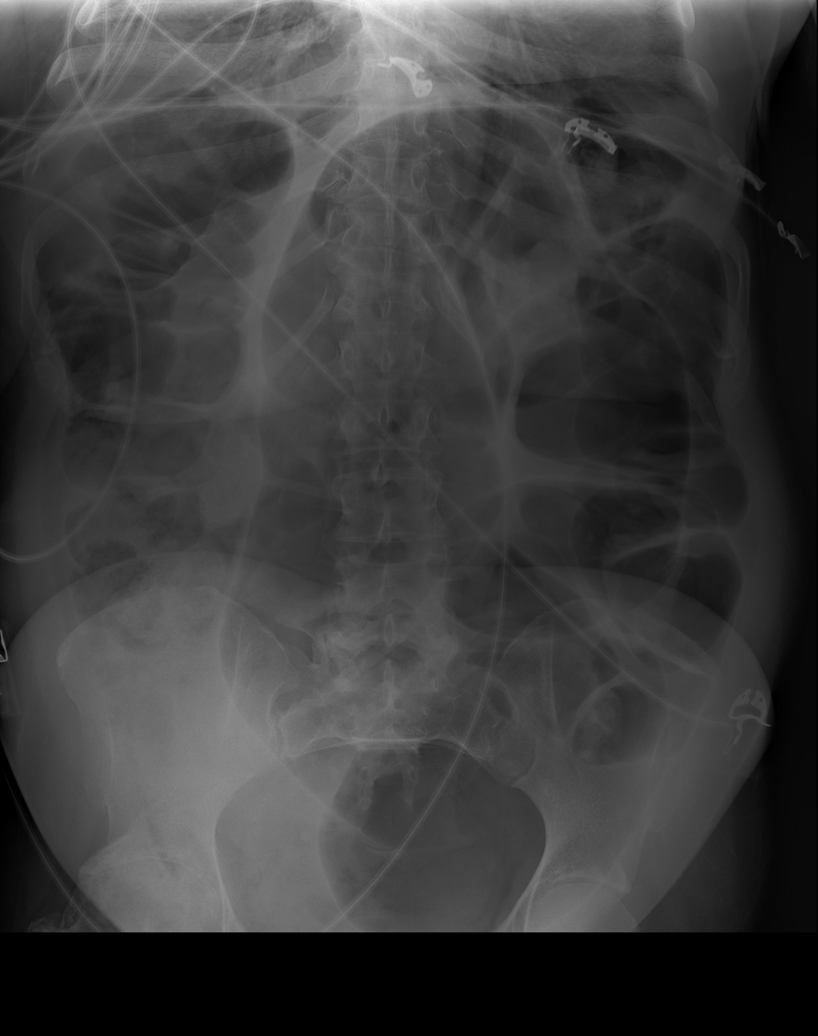

[w abdomen decub (1 of 2)]
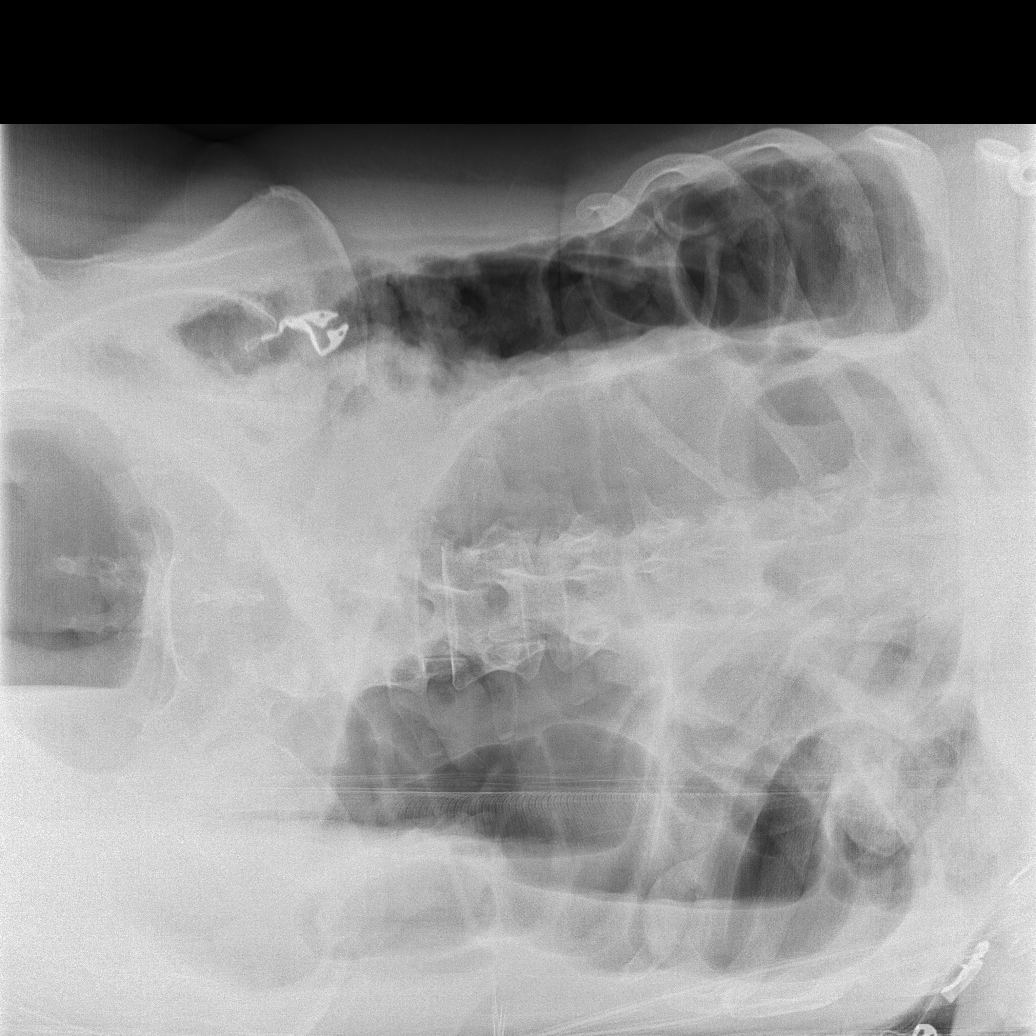

[w abdomen decub (2 of 2)]
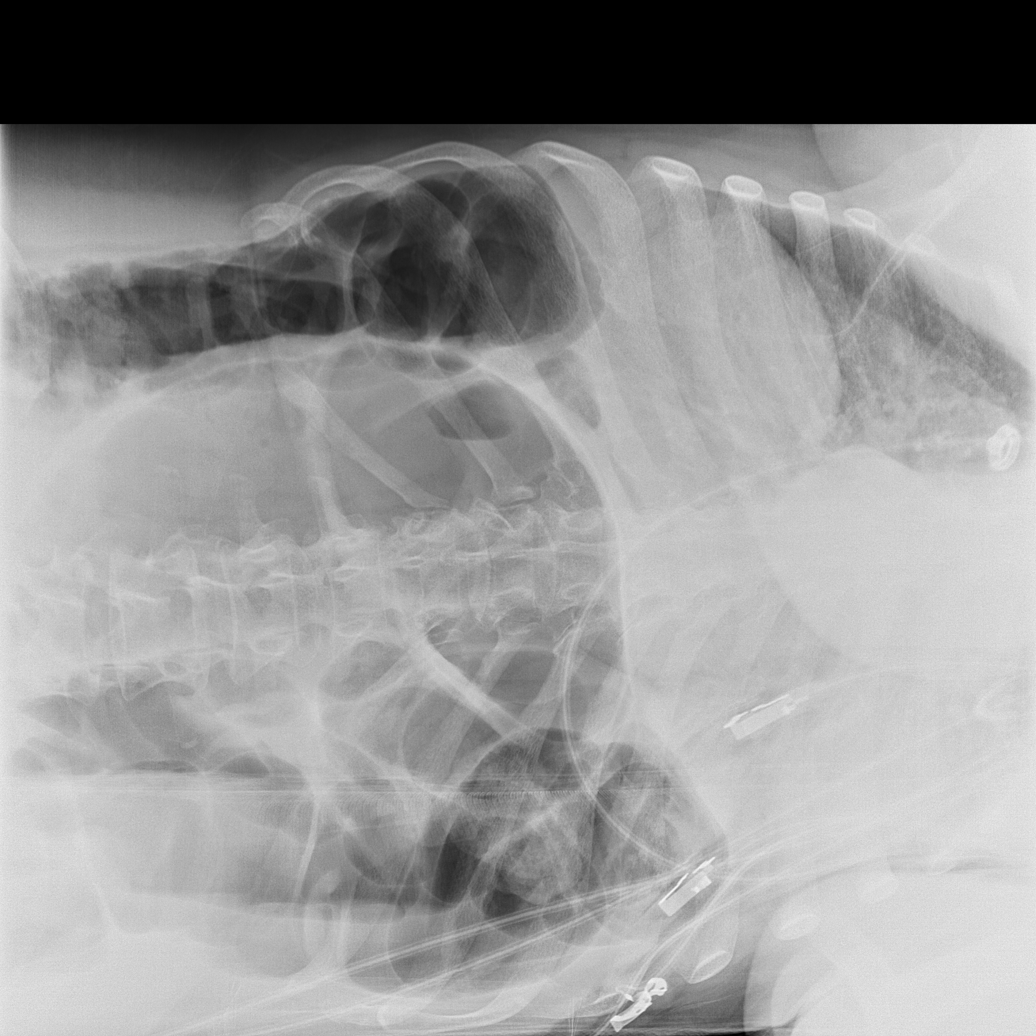

[x chest ap]
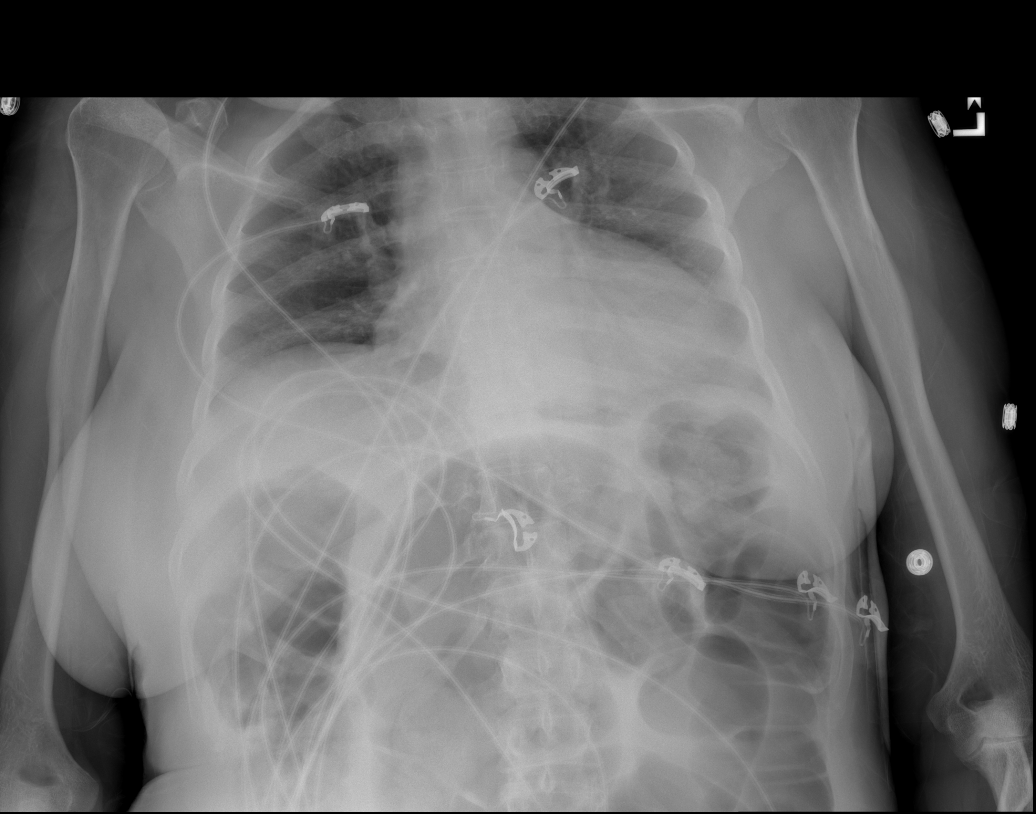

[4 of 4 positions shown; findings below may reference images not displayed]

FINDINGS: Marked gaseous distention of the colon, especially the
sigmoid colon, similar to the prior examinations.  Air-fluid levels
consistent with liquid stool on the lateral decubitus image.  No
evidence of free intraperitoneal air.  No significant small bowel
gas.  No visible opaque urinary tract calculi.  Degenerative
changes involving the lower lumbar spine and severe degenerative
changes in the right hip.

Suboptimal inspiration accounts for atelectasis in the lung bases,
right greater than left.  Lungs otherwise clear.  Cardiac
silhouette upper normal in size to slightly enlarged but stable.
Pulmonary vascularity normal.
IMPRESSION: 1.  Recurrent severe diffuse colonic ileus, with marked enlargement
of the sigmoid colon.
2.  No evidence of bowel obstruction or free intraperitoneal air.
3.  Suboptimal inspiration accounts for bibasilar atelectasis,
right greater than left.  Stable borderline to mild cardiomegaly
without pulmonary edema.

## 2012-06-04 ENCOUNTER — Emergency Department (HOSPITAL_COMMUNITY): Payer: Medicare Other

## 2012-06-04 ENCOUNTER — Encounter (HOSPITAL_COMMUNITY): Payer: Self-pay | Admitting: *Deleted

## 2012-06-04 ENCOUNTER — Emergency Department (HOSPITAL_COMMUNITY)
Admission: EM | Admit: 2012-06-04 | Discharge: 2012-06-04 | Disposition: A | Discharge status: Home / Self Care | Payer: Medicare Other | Attending: Emergency Medicine | Admitting: Emergency Medicine

## 2012-06-04 DIAGNOSIS — E119 Type 2 diabetes mellitus without complications: Secondary | ICD-10-CM | POA: Insufficient documentation

## 2012-06-04 DIAGNOSIS — I10 Essential (primary) hypertension: Secondary | ICD-10-CM | POA: Insufficient documentation

## 2012-06-04 DIAGNOSIS — Z79899 Other long term (current) drug therapy: Secondary | ICD-10-CM | POA: Insufficient documentation

## 2012-06-04 DIAGNOSIS — K5900 Constipation, unspecified: Secondary | ICD-10-CM | POA: Insufficient documentation

## 2012-06-04 DIAGNOSIS — R109 Unspecified abdominal pain: Secondary | ICD-10-CM | POA: Insufficient documentation

## 2012-06-04 HISTORY — DX: Other constipation: K59.09

## 2012-06-04 LAB — COMPREHENSIVE METABOLIC PANEL
ALT: 14 U/L (ref 0–35)
Calcium: 9.6 mg/dL (ref 8.4–10.5)
Creatinine, Ser: 0.52 mg/dL (ref 0.50–1.10)
GFR calc Af Amer: >90 mL/min (ref >90)
Glucose, Bld: 88 mg/dL (ref 70–99)
Sodium: 138 mEq/L (ref 135–145)
Total Protein: 7.6 g/dL (ref 6.0–8.3)

## 2012-06-04 LAB — CBC WITH DIFFERENTIAL/PLATELET
HCT: 40.1 % (ref 36.0–46.0)
Hemoglobin: 13.2 g/dL (ref 12.0–15.0)
Lymphocytes Relative: 29 % (ref 12–46)
Lymphs Abs: 2.2 10*3/uL (ref 0.7–4.0)
MCHC: 32.9 g/dL (ref 30.0–36.0)
Monocytes Absolute: 0.5 10*3/uL (ref 0.1–1.0)
Monocytes Relative: 7 % (ref 3–12)
Neutro Abs: 4.9 10*3/uL (ref 1.7–7.7)
RBC: 4.57 MIL/uL (ref 3.87–5.11)
WBC: 7.7 10*3/uL (ref 4.0–10.5)

## 2012-06-04 LAB — URINALYSIS, ROUTINE W REFLEX MICROSCOPIC
Nitrite: NEGATIVE
Specific Gravity, Urine: 1.022 (ref 1.005–1.030)
Urobilinogen, UA: 1 mg/dL (ref 0.0–1.0)
pH: 6 (ref 5.0–8.0)

## 2012-06-04 LAB — PROCALCITONIN: Procalcitonin: <0.1 ng/mL

## 2012-06-04 LAB — URINE MICROSCOPIC-ADD ON

## 2012-06-04 MED ORDER — LORAZEPAM 1 MG PO TABS
1.0000 mg | ORAL_TABLET | Freq: Once | ORAL | Status: AC
  Administered 2012-06-04: 1 mg via ORAL
  Filled 2012-06-04: qty 1

## 2012-06-04 MED ORDER — SODIUM CHLORIDE 0.9 % IV SOLN: INTRAVENOUS | Status: DC

## 2012-06-04 MED ORDER — CARVEDILOL 25 MG PO TABS
25.0000 mg | ORAL_TABLET | Freq: Once | ORAL | Status: AC
  Administered 2012-06-04: 25 mg via ORAL
  Filled 2012-06-04: qty 1

## 2012-06-04 NOTE — ED Notes (Signed)
Pt has MR, pt's sister states her blood pressure is high and she hasn't had a BM in 3 days, states she's noticed her abdomen getting distended. Sister states pt has a hx of colon problems and constipation. Pt in no distress, resting in bed.

## 2012-06-04 NOTE — ED Provider Notes (Signed)
History     CSN: [id number]  Arrival date & time 06/04/12  1320   First MD Initiated Contact with Patient 06/04/12 1516      Chief Complaint  Patient presents with  . Hypertension     Patient is a 61 y.o. female presenting with hypertension. The history is provided by a relative and a caregiver. History Limited By: Hx MR.  Hypertension  Pt was seen at 1515.   Per sister, pt with gradual onset and persistence of constant "high blood pressure" and "no BM" in the past 3 days.  Pt's sister states she is giving pt all her meds and performing pt's bowel regime as previously rx.  Describes pt's abd as "looking bloated."  Last enema yesterday.  Pt with significant hx of MR, only states she "wants to go home now."  No reported fevers, no vomiting/diarrhea, no cough/SOB, no rash.    Past Medical History  Diagnosis Date  . Hypertension   . Stroke   . Diabetes mellitus   . Chronic constipation     Past Surgical History  Procedure Date  . No past surgeries      History  Substance Use Topics  . Smoking status: Never Smoker   . Smokeless tobacco: Never Used  . Alcohol Use: No      Review of Systems  Unable to perform ROS: Other    Allergies  Review of patient's allergies indicates no known allergies.  Home Medications   Current Outpatient Rx  Name Route Sig Dispense Refill  . CARVEDILOL 25 MG PO TABS Oral Take 1 tablet (25 mg total) by mouth 2 (two) times daily with a meal. 60 tablet 0  . LISINOPRIL 20 MG PO TABS Oral Take 2 tablets (40 mg total) by mouth daily. 60 tablet 0  . METFORMIN HCL ER 500 MG PO TB24 Oral Take 1 tablet (500 mg total) by mouth 2 (two) times daily with a meal. 60 tablet 0    Start with taking 1 tablet with breakfast once dai ...  . THERA M PLUS PO TABS Oral Take 1 tablet by mouth daily.      Bernadette Hoit SODIUM 8.6-50 MG PO TABS Oral Take 1 tablet by mouth 2 (two) times daily. 60 tablet 9    BP 205/104  Pulse 92  Temp 98.6 F (37 C)  (Oral)  Resp 12  SpO2 100%  Physical Exam 1520: Physical examination:  Nursing notes reviewed; Vital signs and O2 SAT reviewed;  Constitutional: Well developed, Well nourished, In no acute distress; Head:  Normocephalic, atraumatic; Eyes: EOMI, PERRL, No scleral icterus; ENMT: Mouth and pharynx normal, Mucous membranes dry; Neck: Supple, Full range of motion, No lymphadenopathy; Cardiovascular: Regular rate and rhythm, No gallop; Respiratory: Breath sounds clear & equal bilaterally, No wheezes.  Speaking full sentences with ease, Normal respiratory effort/excursion; Chest: Nontender, Movement normal; Abdomen: Nontender, +softly distended, +typanitic, decreased bowel sounds; Extremities: Pulses normal, No tenderness, No edema, No calf edema or asymmetry.; Rectal exam performed w/permission of pt's caregiver and ED Tech chaparone present.  Anal tone normal.  Non-tender, soft brown stool in rectal vault.  No fecal impaction.  No fissures, no external hemorrhoids, no palp masses.; Neuro: Awake, alert, confused and at baseline per Hx MR, Moves all ext on stretcher without apparent gross focal motor deficits.; Skin: Color normal, Warm, Dry.; Psych:  Occasionally agitated, trying to get out of bed and saying she "wants to go home now."    ED Course  Procedures  MDM  MDM Reviewed: nursing note, previous chart and vitals Reviewed previous: CT scan, x-ray and labs Interpretation: labs and x-ray   Results for orders placed during the hospital encounter of 06/04/12  COMPREHENSIVE METABOLIC PANEL      Component Value Range   Sodium 138  135 - 145 mEq/L   Potassium 3.5  3.5 - 5.1 mEq/L   Chloride 102  96 - 112 mEq/L   CO2 24  19 - 32 mEq/L   Glucose, Bld 88  70 - 99 mg/dL   BUN 11  6 - 23 mg/dL   Creatinine, Ser 1.61  0.50 - 1.10 mg/dL   Calcium 9.6  8.4 - 09.6 mg/dL   Total Protein 7.6  6.0 - 8.3 g/dL   Albumin 3.8  3.5 - 5.2 g/dL   AST 18  0 - 37 U/L   ALT 14  0 - 35 U/L   Alkaline Phosphatase  86  39 - 117 U/L   Total Bilirubin 0.4  0.3 - 1.2 mg/dL   GFR calc non Af Amer >90  >90 mL/min   GFR calc Af Amer >90  >90 mL/min  CBC WITH DIFFERENTIAL      Component Value Range   WBC 7.7  4.0 - 10.5 K/uL   RBC 4.57  3.87 - 5.11 MIL/uL   Hemoglobin 13.2  12.0 - 15.0 g/dL   HCT 04.5  40.9 - 81.1 %   MCV 87.7  78.0 - 100.0 fL   MCH 28.9  26.0 - 34.0 pg   MCHC 32.9  30.0 - 36.0 g/dL   RDW 91.4  78.2 - 95.6 %   Platelets 207  150 - 400 K/uL   Neutrophils Relative 63  43 - 77 %   Neutro Abs 4.9  1.7 - 7.7 K/uL   Lymphocytes Relative 29  12 - 46 %   Lymphs Abs 2.2  0.7 - 4.0 K/uL   Monocytes Relative 7  3 - 12 %   Monocytes Absolute 0.5  0.1 - 1.0 K/uL   Eosinophils Relative 1  0 - 5 %   Eosinophils Absolute 0.1  0.0 - 0.7 K/uL   Basophils Relative 0  0 - 1 %   Basophils Absolute 0.0  0.0 - 0.1 K/uL  LIPASE, BLOOD      Component Value Range   Lipase 32  11 - 59 U/L  LACTIC ACID, PLASMA      Component Value Range   Lactic Acid, Venous 1.3  0.5 - 2.2 mmol/L  PROCALCITONIN      Component Value Range   Procalcitonin <0.10    URINALYSIS, ROUTINE W REFLEX MICROSCOPIC      Component Value Range   Color, Urine YELLOW  YELLOW   APPearance CLEAR  CLEAR   Specific Gravity, Urine 1.022  1.005 - 1.030   pH 6.0  5.0 - 8.0   Glucose, UA NEGATIVE  NEGATIVE mg/dL   Hgb urine dipstick NEGATIVE  NEGATIVE   Bilirubin Urine NEGATIVE  NEGATIVE   Ketones, ur TRACE (*) NEGATIVE mg/dL   Protein, ur 30 (*) NEGATIVE mg/dL   Urobilinogen, UA 1.0  0.0 - 1.0 mg/dL   Nitrite NEGATIVE  NEGATIVE   Leukocytes, UA SMALL (*) NEGATIVE  URINE MICROSCOPIC-ADD ON      Component Value Range   Squamous Epithelial / LPF FEW (*) RARE   WBC, UA 7-10  <3 WBC/hpf   Bacteria, UA RARE  RARE   Ct Abdomen Pelvis Wo Contrast  06/04/2012  *RADIOLOGY REPORT*  Clinical Data: 61 year old female with abdominal distention.  Pain.  CT ABDOMEN AND PELVIS WITHOUT CONTRAST  Technique:  Multidetector CT imaging of the abdomen and  pelvis was performed following the standard protocol without intravenous contrast.  Comparison: 12/13/2011 and earlier.  Findings: Lung bases are clear.  Mild cardiomegaly.  No pericardial or pleural effusion.  Lumbar facet degeneration.  Severe post traumatic or degenerative changes at the right hip are stable. No acute osseous abnormality identified.  Chronic dilatation of the colon.  The sigmoid colon is redundant. The proximal sigmoid contains gas, and there is a large volume of retained stool in the distal sigmoid and rectum.  No pelvic free fluid.  Associated mass effect on the uterus and bladder.  Mostly gas throughout the remainder of the colon.  Intermittent stool.  Oral contrast has reached the right colon. Normal gas containing appendix coursing toward the midline (series 2 image 47).  No dilated small bowel.  Stomach is decompressed.  Duodenum is within normal limits.  Negative noncontrast liver, gallbladder, spleen, pancreas, adrenal glands, and kidneys.  No pneumoperitoneum.  No abdominal free fluid.  IMPRESSION: 1.  Chronic decreased colonic inertia (institutional bowel). Relatively large volume of retained stool now in the sigmoid colon and rectum compatible with fecal impaction. 2.  Normal appendix.  No inflammatory changes in the abdomen or pelvis.  Original Report Authenticated By: Harley Hallmark, M.D.   Dg Abd Acute W/chest 06/04/2012  *RADIOLOGY REPORT*  Clinical Data: Abdominal distention and pain.  ACUTE ABDOMEN SERIES (ABDOMEN 2 VIEW & CHEST 1 VIEW)  Comparison: 02/28/2012 and 02/26/2012.  Findings: Frontal view of the chest shows midline trachea and stable heart size.  Very minimal bibasilar atelectasis.  No pleural fluid.  Two views of the abdomen show marked gaseous distention of the colon with scattered stool.  A large amount stool is seen in the rectosigmoid colon.  Advanced degenerative changes in the right hip.  IMPRESSION: Probable colonic ileus with fecal impaction.  Original  Report Authenticated By: Reyes Ivan, M.D.      9:29 PM:   Pt is sitting in a chair dressed by her sister and they want to go home now.  Pt was given ativan for CT scan, calmer now.  VSS, resps easy, gait per baseline.  Pt has not had N/V or stooling while in the ED.  Pt remains at her mental baseline, resps easy, NAD.  Pt has long hx of difficult to control BP, family states they have given pt her usual morning meds today but are requesting dose of pt's usual nightly anti-HTN meds now.  Dx testing d/w pt and family.  Questions answered.  Verb understanding, agreeable to d/c home with outpt f/u.         Laray Anger, DO 06/07/12 1252

## 2012-06-04 NOTE — Discharge Instructions (Signed)
RESOURCE GUIDE  Chronic Pain Problems: Contact [facility]  [phone] Patients need to be referred by their primary care doctor.  Insufficient Money for Medicine: Contact United Way:  call "211" or [facility] [phone].  No Primary Care Doctor: - Call [facility]  [phone] - can help you locate a primary care doctor that  accepts your insurance, provides certain services, etc. - Physician Referral Service- 1-800-533-3463  Agencies that provide inexpensive medical care: - [facility]  [phone] - [facility]  [phone] - [facility]  [phone] - [facility]  [phone] - Planned Parenthood  [phone] - [facility]  [phone]  Medicaid-accepting [place] Providers: - [facility]- [address]  [phone], Mon-Fri 9am-7pm, Sat 9am-1pm - [facility]- [address], [address]  [phone] - [facility]- [address], [address]  [phone] - [facility]- [address] [place] [address]  [phone] - Veita Bland- [address], [phone]  Only accepts [place] Access Medicaid patients after they have their name  applied to their card  Self Pay (no insurance) in [place]: - Sickle Cell Patients: Dr Eric Dean, [facility]  [address], [phone] - [facility]- [address]  [phone]       -     [facility]- [address]       -     [facility]- see information above (Speak to Pam H if you do not have insurance)       -  [facility]- [address], [phone]       -  [facility] [place]- [address],  [phone]       -  [facility]- [address] [place] [address], [phone]       -  Dr Osei-Bonsu-  [address], [place], [phone]       -  [facility]- [address], [phone]       -  [facility]- [address] [place] [address], [phone], also [address], [phone]       -    [facility]- [address], [phone], 1st & 3rd Saturday   every month, 10am-1pm  1) Find a Doctor and Pay Out of Pocket Although you won't have to find out who is covered by your insurance plan, it is a good idea to ask around and get recommendations. You will then need to call the office and see if the doctor you have chosen will accept you as a new patient and what types of options they offer for patients who are self-pay. Some doctors offer discounts or will set up payment plans for their patients who do not have insurance, but you will need to ask so you aren't surprised when you get to your appointment.  2) Contact Your Local Health Department Not all health departments have doctors that can see patients for sick visits, but many do, so it is worth a call to see if yours does. If you don't know where your local health department is, you can check in your phone book. The CDC also has a tool to help you locate your state's health department, and many state websites also have   listings of all of their local health departments.  3) Find a Walk-in Clinic If your illness is not likely to be very severe or complicated, you may want to try a walk in clinic. These are popping up all over the country in pharmacies, drugstores, and shopping centers. They're usually staffed by nurse practitioners or physician assistants that have been trained to treat common illnesses and complaints. They're usually fairly quick and inexpensive. However, if you have serious medical issues or chronic medical problems, these are probably not your best option  STD Testing - [place] [facility] [place], [facility], [address], [place], phone [phone] or 1-877-539-9860.  Monday - Friday, call for an appointment. - [place]  [facility] [place], [facility], [address], [place], phone [phone] or 1-877-539-9860.  Monday - Friday, call for an appointment.  Abuse/Neglect: - [place] Child Abuse Hotline (336) 641-3795 - [place] Child Abuse Hotline 800-378-5315 (After Hours)  Emergency Shelter:  [facility] (336) 271-5985  Maternity Homes: - [facility] (336) 275-9566 - [facility] (704) 372-4663  MRSA Hotline #:   832-7006  [place] Resources  [facility] [place]  [employer] [place] [facility] [address]                 [address]         [address]  [place]                                               [place]                              [place] Phone:  [phone]                                  Phone:  [phone]                   Phone:  [phone]  [place] [facility], [phone] - [place] Services - [facility]- 1-888-581-9988       -     [facility] in [place], [address],                                  336-349-4454, Insurance  [place] Child Abuse Hotline (336) 342-1394 or (336) 342-3537 (After Hours)   Behavioral Health Services  Substance Abuse Resources: - [facility]  336-882-2125 - [facility] 336-784-9470 - [facility] 336-285-9073 - [facility] 336-845-3988 - Residential & Outpatient Substance Abuse Program  800-659-3381  Psychological Services: -  Health  [phone] - [facility]  [phone] - [place] [facility], [address], [place], ACCESS LINE: 1-800-853-5163 or 336-641-4981, Http://www.[facility].com/services/adult.htm  Dental Assistance  If unable to pay or uninsured, contact:  [facility] or [place] [facility] to become qualified for the adult dental  clinic.  Patients with Medicaid:  [facility] [address], [phone], [phone]  If unable   to pay, or uninsured, contact [facility] (575)271-2624) or [facility] (250)813-2496 in [place], [phone] in [facility]) to become qualified for the adult dental clinic  Other Low-Cost Community Dental Services: - [facility]- [address] [place], [place], [place], [address], [phone], [address]. [phone], 2nd and 4th Thursday of the month at 6:30am.  10 clients each day by appointment, can sometimes see walk-in patients if someone does not show for an appointment. [facility]- [address] [place], [place], [address], [phone] - [facility]- [address], [place], [place], [address], [phone] [facility]- [phone] [facility]- [phone] [facility] [phone]     Take your usual prescriptions as previously directed.  Continue your bowel regime as previously prescribed.  Call your regular medical doctor tomorrow morning to schedule a follow up appointment within the next 1 to 2 days.  Return to the Emergency Department immediately sooner if worsening.

## 2012-06-04 NOTE — ED Notes (Signed)
Pts family reports that she has had high blood pressure x's 3 days. Also reports that "her bowels don't move right and she hasn't had a BM in 4 days." Pt has MR.

## 2012-06-06 LAB — URINE CULTURE: Culture  Setup Time: 201306262223

## 2012-07-28 ENCOUNTER — Encounter (HOSPITAL_COMMUNITY): Payer: Self-pay | Admitting: *Deleted

## 2012-07-28 ENCOUNTER — Emergency Department (HOSPITAL_COMMUNITY)
Admission: EM | Admit: 2012-07-28 | Discharge: 2012-07-29 | Disposition: A | Discharge status: Home / Self Care | Payer: Medicare Other | Attending: Emergency Medicine | Admitting: Emergency Medicine

## 2012-07-28 DIAGNOSIS — Z8673 Personal history of transient ischemic attack (TIA), and cerebral infarction without residual deficits: Secondary | ICD-10-CM | POA: Insufficient documentation

## 2012-07-28 DIAGNOSIS — E119 Type 2 diabetes mellitus without complications: Secondary | ICD-10-CM | POA: Insufficient documentation

## 2012-07-28 DIAGNOSIS — K5900 Constipation, unspecified: Secondary | ICD-10-CM

## 2012-07-28 DIAGNOSIS — I10 Essential (primary) hypertension: Secondary | ICD-10-CM

## 2012-07-28 DIAGNOSIS — R109 Unspecified abdominal pain: Secondary | ICD-10-CM | POA: Insufficient documentation

## 2012-07-28 DIAGNOSIS — IMO0002 Reserved for concepts with insufficient information to code with codable children: Secondary | ICD-10-CM | POA: Insufficient documentation

## 2012-07-28 DIAGNOSIS — Z79899 Other long term (current) drug therapy: Secondary | ICD-10-CM | POA: Insufficient documentation

## 2012-07-28 DIAGNOSIS — F79 Unspecified intellectual disabilities: Secondary | ICD-10-CM

## 2012-07-28 NOTE — ED Notes (Signed)
Pt has history of MR; sister is caregiver; states patient has become more and more agitated over last few days; refusing to take medications and insulin.  C/o headache last wk

## 2012-07-29 ENCOUNTER — Emergency Department (HOSPITAL_COMMUNITY): Payer: Medicare Other

## 2012-07-29 LAB — COMPREHENSIVE METABOLIC PANEL
ALT: 15 U/L (ref 0–35)
AST: 19 U/L (ref 0–37)
Albumin: 3.5 g/dL (ref 3.5–5.2)
Alkaline Phosphatase: 80 U/L (ref 39–117)
CO2: 24 mEq/L (ref 19–32)
Chloride: 103 mEq/L (ref 96–112)
Creatinine, Ser: 0.57 mg/dL (ref 0.50–1.10)
GFR calc non Af Amer: >90 mL/min (ref >90)
Potassium: 4 mEq/L (ref 3.5–5.1)
Sodium: 138 mEq/L (ref 135–145)
Total Bilirubin: 0.3 mg/dL (ref 0.3–1.2)

## 2012-07-29 LAB — CBC WITH DIFFERENTIAL/PLATELET
Basophils Absolute: 0 10*3/uL (ref 0.0–0.1)
Basophils Relative: 0 % (ref 0–1)
HCT: 39.3 % (ref 36.0–46.0)
Lymphocytes Relative: 34 % (ref 12–46)
MCHC: 33.1 g/dL (ref 30.0–36.0)
Monocytes Absolute: 0.7 10*3/uL (ref 0.1–1.0)
Neutro Abs: 4.8 10*3/uL (ref 1.7–7.7)
Neutrophils Relative %: 56 % (ref 43–77)
RDW: 14 % (ref 11.5–15.5)
WBC: 8.5 10*3/uL (ref 4.0–10.5)

## 2012-07-29 LAB — URINALYSIS, ROUTINE W REFLEX MICROSCOPIC
Glucose, UA: NEGATIVE mg/dL
Nitrite: NEGATIVE
Protein, ur: NEGATIVE mg/dL
pH: 7 (ref 5.0–8.0)

## 2012-07-29 LAB — URINE MICROSCOPIC-ADD ON

## 2012-07-29 MED ORDER — FLEET ENEMA 7-19 GM/118ML RE ENEM
1.0000 | ENEMA | Freq: Once | RECTAL | Status: AC
  Administered 2012-07-29: 1 via RECTAL
  Filled 2012-07-29: qty 1

## 2012-07-29 MED ORDER — CARVEDILOL 25 MG PO TABS
25.0000 mg | ORAL_TABLET | Freq: Two times a day (BID) | ORAL | Status: DC
  Filled 2012-07-29 (×3): qty 1

## 2012-07-29 MED ORDER — LISINOPRIL 40 MG PO TABS
40.0000 mg | ORAL_TABLET | Freq: Once | ORAL | Status: AC
  Administered 2012-07-29: 40 mg via ORAL
  Filled 2012-07-29: qty 1

## 2012-07-29 NOTE — ED Provider Notes (Signed)
History     CSN: [id number]  Arrival date & time 07/28/12  2216   First MD Initiated Contact with Patient 07/29/12 [phone]      Chief Complaint  Patient presents with  . Agitation    (Consider location/radiation/quality/duration/timing/severity/associated sxs/prior treatment) HPI 61 year old female presents to emergency room with her sister. Patient is mentally retarded and does not speak. Sister reports she's been refusing all of her medications along with not want to eat or drink for the last 2-3 days. Patient had a headache last week, which is not out of the ordinary for her. Patient has history of chronic constipation, has been refusing her MiraLAX, Senokot and enemas. Patient sister does not know how to get her to take her medications, and is concerned that her blood pressure is elevated. No complaint of chest pain shortness of breath or current headache at this time. Although this is limited given patient's mental retardation  Past Medical History  Diagnosis Date  . Hypertension   . Stroke   . Diabetes mellitus   . Chronic constipation     Past Surgical History  Procedure Date  . No past surgeries     No family history on file.  History  Substance Use Topics  . Smoking status: Never Smoker   . Smokeless tobacco: Never Used  . Alcohol Use: No    OB History    Grav Para Term Preterm Abortions TAB SAB Ect Mult Living                  Review of Systems  Unable to perform ROS: Dementia    Allergies  Review of patient's allergies indicates no known allergies.  Home Medications   Current Outpatient Rx  Name Route Sig Dispense Refill  . CARVEDILOL 25 MG PO TABS Oral Take 1 tablet (25 mg total) by mouth 2 (two) times daily with a meal. 60 tablet 0  . INSULIN GLARGINE 100 UNIT/ML [place] SOLN Subcutaneous Inject 10-20 Units into the skin at bedtime. 1O units if sugar is greater than 79 and 20 units if sugar is greater than 90    . LISINOPRIL 20 MG PO TABS Oral Take 2  tablets (40 mg total) by mouth daily. 60 tablet 0  . METFORMIN HCL ER 500 MG PO TB24 Oral Take 1 tablet (500 mg total) by mouth 2 (two) times daily with a meal. 60 tablet 0    Start with taking 1 tablet with breakfast once dai ...  . THERA M PLUS PO TABS Oral Take 1 tablet by mouth daily.      Bernadette Hoit SODIUM 8.6-50 MG PO TABS Oral Take 1 tablet by mouth 2 (two) times daily. 60 tablet 9    BP 180/77  Pulse 84  Temp 98.3 F (36.8 C)  Resp 18  SpO2 99%  Physical Exam  Nursing note and vitals reviewed. Constitutional: She appears well-developed and well-nourished. No distress.  HENT:  Head: Normocephalic and atraumatic.  Nose: Nose normal.  Mouth/Throat: Oropharynx is clear and moist.  Eyes: Conjunctivae and EOM are normal. Pupils are equal, round, and reactive to light.  Neck: Normal range of motion. Neck supple. No JVD present. No tracheal deviation present. No thyromegaly present.  Cardiovascular: Normal rate, regular rhythm, normal heart sounds and intact distal pulses.  Exam reveals no gallop and no friction rub.   No murmur heard. Pulmonary/Chest: Effort normal and breath sounds normal. No stridor. No respiratory distress. She has no wheezes. She has no rales.  She exhibits no tenderness.  Abdominal: Soft. Bowel sounds are normal. She exhibits distension (Moderate distention of the abdomen). She exhibits no mass. There is tenderness (diffuse mild tenderness). There is no rebound and no guarding.  Musculoskeletal: Normal range of motion. She exhibits no edema and no tenderness.  Lymphadenopathy:    She has no cervical adenopathy.  Neurological: She exhibits normal muscle tone. Coordination normal.  Skin: Skin is warm and dry. No rash noted. She is not diaphoretic. No erythema. No pallor.    ED Course  Procedures (including critical care time)  Labs Reviewed  URINALYSIS, ROUTINE W REFLEX MICROSCOPIC - Abnormal; Notable for the following:    APPearance CLOUDY (*)       Ketones, ur TRACE (*)     Leukocytes, UA SMALL (*)     All other components within normal limits  CBC WITH DIFFERENTIAL  COMPREHENSIVE METABOLIC PANEL  URINE MICROSCOPIC-ADD ON   Dg Abd Acute W/chest  07/29/2012  *RADIOLOGY REPORT*  Clinical Data: Abdominal pain and distention  ACUTE ABDOMEN SERIES (ABDOMEN 2 VIEW & CHEST 1 VIEW)  Comparison: CT 06/04/2012, x-ray 06/04/2012  Findings: Cardiac enlargement.  Negative for heart failure or pneumonia.  Lungs are clear.  Chronic colonic dilatation is similar to the prior study.  There is a moderate amount of stool in the colon.  There is stool in the right colon and the rectum.  No air-fluid level or pneumoperitoneum.  Advanced degenerative change in the right hip joint.  IMPRESSION: No active cardiopulmonary disease.  Chronic colonic dilatation and constipation.   Original Report Authenticated By: Camelia Phenes, M.D.      1. Obstipation   2. HTN (hypertension)   3. Mental retardation       MDM  61 year old female who has been refusing medications and food at home. Patient has taken her medications here, has evening and drank. She is also allowed an enema to be given, and has had a bowel movement. No indications for admission at this time, will have her followup with her primary care doctor        Olivia Mackie, MD 07/30/12 [phone]

## 2012-09-17 ENCOUNTER — Observation Stay (HOSPITAL_COMMUNITY): Payer: Medicare Other

## 2012-09-17 ENCOUNTER — Observation Stay (HOSPITAL_COMMUNITY)
Admission: EM | Admit: 2012-09-17 | Discharge: 2012-09-21 | Disposition: A | Discharge status: Home / Self Care | Payer: Medicare Other | Attending: Internal Medicine | Admitting: Internal Medicine

## 2012-09-17 ENCOUNTER — Encounter (HOSPITAL_COMMUNITY): Payer: Self-pay | Admitting: *Deleted

## 2012-09-17 DIAGNOSIS — Z8673 Personal history of transient ischemic attack (TIA), and cerebral infarction without residual deficits: Secondary | ICD-10-CM | POA: Insufficient documentation

## 2012-09-17 DIAGNOSIS — IMO0002 Reserved for concepts with insufficient information to code with codable children: Secondary | ICD-10-CM

## 2012-09-17 DIAGNOSIS — I10 Essential (primary) hypertension: Secondary | ICD-10-CM | POA: Diagnosis present

## 2012-09-17 DIAGNOSIS — Z794 Long term (current) use of insulin: Secondary | ICD-10-CM | POA: Insufficient documentation

## 2012-09-17 DIAGNOSIS — Z79899 Other long term (current) drug therapy: Secondary | ICD-10-CM | POA: Insufficient documentation

## 2012-09-17 DIAGNOSIS — K5900 Constipation, unspecified: Principal | ICD-10-CM | POA: Insufficient documentation

## 2012-09-17 DIAGNOSIS — R451 Restlessness and agitation: Secondary | ICD-10-CM | POA: Diagnosis present

## 2012-09-17 DIAGNOSIS — K5909 Other constipation: Secondary | ICD-10-CM | POA: Diagnosis present

## 2012-09-17 DIAGNOSIS — G9340 Encephalopathy, unspecified: Secondary | ICD-10-CM | POA: Diagnosis present

## 2012-09-17 DIAGNOSIS — E119 Type 2 diabetes mellitus without complications: Secondary | ICD-10-CM | POA: Diagnosis present

## 2012-09-17 LAB — URINALYSIS, ROUTINE W REFLEX MICROSCOPIC
Ketones, ur: NEGATIVE mg/dL
Nitrite: NEGATIVE
Specific Gravity, Urine: 1.022 (ref 1.005–1.030)
pH: 6 (ref 5.0–8.0)

## 2012-09-17 LAB — CBC WITH DIFFERENTIAL/PLATELET
Eosinophils Absolute: 0.1 10*3/uL (ref 0.0–0.7)
Lymphs Abs: 3.1 10*3/uL (ref 0.7–4.0)
MCH: 28.7 pg (ref 26.0–34.0)
Neutrophils Relative %: 67 % (ref 43–77)
Platelets: 309 10*3/uL (ref 150–400)
RBC: 4.67 MIL/uL (ref 3.87–5.11)
WBC: 12.4 10*3/uL — ABNORMAL HIGH (ref 4.0–10.5)

## 2012-09-17 LAB — BASIC METABOLIC PANEL
GFR calc Af Amer: >90 mL/min (ref >90)
GFR calc non Af Amer: >90 mL/min (ref >90)
Glucose, Bld: 89 mg/dL (ref 70–99)
Potassium: 3.8 mEq/L (ref 3.5–5.1)
Sodium: 140 mEq/L (ref 135–145)

## 2012-09-17 LAB — URINE MICROSCOPIC-ADD ON

## 2012-09-17 MED ORDER — ACETAMINOPHEN 325 MG PO TABS: 650.0000 mg | ORAL_TABLET | Freq: Four times a day (QID) | ORAL | Status: DC | PRN

## 2012-09-17 MED ORDER — CLONIDINE HCL 0.2 MG/24HR TD PTWK
0.2000 mg | MEDICATED_PATCH | TRANSDERMAL | Status: DC
  Administered 2012-09-18: 0.2 mg via TRANSDERMAL
  Filled 2012-09-17: qty 1

## 2012-09-17 MED ORDER — BISACODYL 10 MG RE SUPP
10.0000 mg | Freq: Every day | RECTAL | Status: DC
  Administered 2012-09-18 – 2012-09-19 (×2): 10 mg via RECTAL
  Filled 2012-09-17 (×2): qty 1

## 2012-09-17 MED ORDER — ONDANSETRON HCL 4 MG/2ML IJ SOLN: 4.0000 mg | Freq: Four times a day (QID) | INTRAMUSCULAR | Status: DC | PRN

## 2012-09-17 MED ORDER — METOCLOPRAMIDE HCL 5 MG/ML IJ SOLN
10.0000 mg | Freq: Four times a day (QID) | INTRAMUSCULAR | Status: DC
Start: 2012-09-18 — End: 2012-09-20
  Administered 2012-09-18 – 2012-09-20 (×10): 10 mg via INTRAVENOUS
  Filled 2012-09-17 (×14): qty 2

## 2012-09-17 MED ORDER — SODIUM CHLORIDE 0.9 % IV SOLN: 250.0000 mL | INTRAVENOUS | Status: DC | PRN

## 2012-09-17 MED ORDER — ONDANSETRON HCL 4 MG PO TABS: 4.0000 mg | ORAL_TABLET | Freq: Four times a day (QID) | ORAL | Status: DC | PRN

## 2012-09-17 MED ORDER — INSULIN ASPART 100 UNIT/ML ~~LOC~~ SOLN
0.0000 [IU] | SUBCUTANEOUS | Status: DC
  Administered 2012-09-18: 2 [IU] via SUBCUTANEOUS

## 2012-09-17 MED ORDER — SODIUM CHLORIDE 0.9 % IV SOLN
INTRAVENOUS | Status: DC
  Administered 2012-09-17 – 2012-09-20 (×6): via INTRAVENOUS

## 2012-09-17 MED ORDER — MORPHINE SULFATE 2 MG/ML IJ SOLN: 2.0000 mg | INTRAMUSCULAR | Status: DC | PRN

## 2012-09-17 MED ORDER — HEPARIN SODIUM (PORCINE) 5000 UNIT/ML IJ SOLN
5000.0000 [IU] | Freq: Three times a day (TID) | INTRAMUSCULAR | Status: DC
  Administered 2012-09-18 – 2012-09-21 (×10): 5000 [IU] via SUBCUTANEOUS
  Filled 2012-09-17 (×13): qty 1

## 2012-09-17 MED ORDER — SODIUM CHLORIDE 0.9 % IJ SOLN: 3.0000 mL | Freq: Two times a day (BID) | INTRAMUSCULAR | Status: DC

## 2012-09-17 MED ORDER — LORAZEPAM 2 MG/ML IJ SOLN
1.0000 mg | Freq: Once | INTRAMUSCULAR | Status: AC
  Administered 2012-09-17: 1 mg via INTRAVENOUS
  Filled 2012-09-17: qty 1

## 2012-09-17 MED ORDER — ACETAMINOPHEN 650 MG RE SUPP: 650.0000 mg | Freq: Four times a day (QID) | RECTAL | Status: DC | PRN

## 2012-09-17 MED ORDER — LORAZEPAM 2 MG/ML IJ SOLN
1.0000 mg | Freq: Four times a day (QID) | INTRAMUSCULAR | Status: DC | PRN
  Administered 2012-09-18 – 2012-09-19 (×2): 1 mg via INTRAVENOUS
  Filled 2012-09-17 (×2): qty 1

## 2012-09-17 MED ORDER — SODIUM CHLORIDE 0.9 % IJ SOLN: 3.0000 mL | INTRAMUSCULAR | Status: DC | PRN

## 2012-09-17 MED ORDER — HYDRALAZINE HCL 20 MG/ML IJ SOLN
10.0000 mg | Freq: Four times a day (QID) | INTRAMUSCULAR | Status: DC | PRN
  Administered 2012-09-18 – 2012-09-19 (×3): 10 mg via INTRAVENOUS
  Filled 2012-09-17: qty 1
  Filled 2012-09-17: qty 0.5
  Filled 2012-09-17 (×2): qty 1

## 2012-09-17 MED ORDER — FLEET ENEMA 7-19 GM/118ML RE ENEM
1.0000 | ENEMA | Freq: Every day | RECTAL | Status: DC | PRN
  Administered 2012-09-18: 1 via RECTAL
  Filled 2012-09-17: qty 1

## 2012-09-17 NOTE — ED Notes (Signed)
md at bedside

## 2012-09-17 NOTE — ED Notes (Signed)
Attempted to call report - RN to return call.  

## 2012-09-17 NOTE — H&P (Signed)
PCP:   Paulino Rily, MD   Chief Complaint:  Altered mental status, severe obstipation.   HPI: This is a 61 year old female, with known history of HTN, type 2 DM, previous CVA, mental retardation and chronic colonic ileus s/p multiple admissions in the past, for recurrent ileus and abdominal distention, requiring rectal tube for decompression. Last hospitalization was 02/26/12-02/28/12, for severe constipation. Patient was brought in to the ED today, by her sister/HPOA, Zina (Tel: [phone]), because of increasing agitation/combativeness, difficulty administering medications, as well as progressive abdominal discomfort and swelling, overt he past 3 days. Patient is unable to provide a history at this time, due to mental retardation, ad somnolence following iv Ativan administered in the ED. According to patient's sister, over the past 3 weeks, patient has become more combative and easily agitated, and she feels that patient is becoming "more demented" She usually administers an enema to the patient om Mondays and thursday, but was not able to do this on 06/11/12, because she had to travel. On 06/15/12, she tried a gain, but patient fought her very hard, and pulled out the enema, thereafter, passed only a small amount of stool. Since then, her abdomen has become progressively demented, she appears to have upper abdominal discomfort, and is eating very little, refusing to take her medication. Today, her sister took her blood pressure and found SBP to be in the 200s. She then brought patient to the ED.     Allergies:  No Known Allergies    Past Medical History  Diagnosis Date  . Hypertension   . Stroke   . Diabetes mellitus   . Chronic constipation     Past Surgical History  Procedure Date  . No past surgeries     Prior to Admission medications   Medication Sig Start Date End Date Taking? Authorizing Provider  carvedilol (COREG) 25 MG tablet Take 1 tablet (25 mg total) by mouth 2 (two) times  daily with a meal. 02/28/12 02/27/13 Yes Clanford L Johnson, MD  insulin glargine (LANTUS) 100 UNIT/ML injection Inject 10-20 Units into the skin at bedtime. 1O units if sugar is greater than 79 and 20 units if sugar is greater than 90   Yes Historical Provider, MD  lisinopril (PRINIVIL,ZESTRIL) 20 MG tablet Take 2 tablets (40 mg total) by mouth daily. 02/28/12 02/27/13 Yes Clanford Cyndie Mull, MD  metFORMIN (GLUCOPHAGE XR) 500 MG 24 hr tablet Take 1 tablet (500 mg total) by mouth 2 (two) times daily with a meal. 02/28/12 02/27/13 Yes Clanford Cyndie Mull, MD  Multiple Vitamins-Minerals (MULTIVITAMINS THER. W/MINERALS) TABS Take 1 tablet by mouth daily.     Yes Historical Provider, MD  senna-docusate (SENOKOT-S) 8.6-50 MG per tablet Take 1 tablet by mouth 2 (two) times daily. 10/27/11 10/26/12 Yes Maryruth Bun Rama, MD    Social History:  reports that she has never smoked. She has never used smokeless tobacco. She reports that she drinks alcohol. She reports that she does not use illicit drugs.  Family History:  Unobtainable.    Review of Systems:  As per HPI and chief complaint. Otherwise, unable to obtain. Per patient's sister, she has no fatigue, diminished appetite, but has lost about 40 pounds in weight, since her last hospitalization. No fever, chills, headache, blurred vision, difficulty in speaking, dysphagia, chest pain, cough, shortness of breath, orthopnea, paroxysmal nocturnal dyspnea, nausea, vomiting, diarrhea, hematemesis, melena, dysuria, nocturia, urinary frequency, hematochezia, lower extremity swelling, pain, or redness. The rest of the systems review is negative.  Physical Exam:  General:  Patient does not appear to be in obvious acute distress. Sleeping, after iv ativan, administered in the ED, but easily rousable, and when roused, responds to simple commands. Not short of breath at rest.  HEENT:  No clinical pallor, no jaundice, no conjunctival injection or discharge. Hydration  status appears fair.  NECK:  Supple, JVP not seen, no carotid bruits, no palpable lymphadenopathy, no palpable goiter. CHEST:  Clinically clear to auscultation, no wheezes, no crackles. HEART:  Sounds 1 and 2 heard, normal, regular, no murmurs. ABDOMEN:  Markedly, but softly distended, non-tender, tympanitic, unable to palpate organs, bowel sounds heard. GENITALIA:  Not examined. RECTAL EXAM: Uninflammed external hemorrhoids noted. Rectal vault is empty.  LOWER EXTREMITIES:  No pitting edema, palpable peripheral pulses. MUSCULOSKELETAL SYSTEM:  Unremarkable. CENTRAL NERVOUS SYSTEM:  No focal neurologic deficit on gross examination.  Labs on Admission:  Results for orders placed during the hospital encounter of 09/17/12 (from the past 48 hour(s))  URINALYSIS, ROUTINE W REFLEX MICROSCOPIC     Status: Abnormal   Collection Time   09/17/12  5:36 PM      Component Value Range Comment   Color, Urine YELLOW  YELLOW    APPearance CLEAR  CLEAR    Specific Gravity, Urine 1.022  1.005 - 1.030    pH 6.0  5.0 - 8.0    Glucose, UA NEGATIVE  NEGATIVE mg/dL    Hgb urine dipstick NEGATIVE  NEGATIVE    Bilirubin Urine SMALL (*) NEGATIVE    Ketones, ur NEGATIVE  NEGATIVE mg/dL    Protein, ur 30 (*) NEGATIVE mg/dL    Urobilinogen, UA 1.0  0.0 - 1.0 mg/dL    Nitrite NEGATIVE  NEGATIVE    Leukocytes, UA SMALL (*) NEGATIVE   URINE MICROSCOPIC-ADD ON     Status: Normal   Collection Time   09/17/12  5:36 PM      Component Value Range Comment   Squamous Epithelial / LPF RARE  RARE    WBC, UA 3-6  <3 WBC/hpf    Bacteria, UA RARE  RARE   GLUCOSE, CAPILLARY     Status: Normal   Collection Time   09/17/12  5:39 PM      Component Value Range Comment   Glucose-Capillary 82  70 - 99 mg/dL   CBC WITH DIFFERENTIAL     Status: Abnormal   Collection Time   09/17/12  6:17 PM      Component Value Range Comment   WBC 12.4 (*) 4.0 - 10.5 K/uL    RBC 4.67  3.87 - 5.11 MIL/uL    Hemoglobin 13.4  12.0 - 15.0 g/dL     HCT 40.9  81.1 - 91.4 %    MCV 87.8  78.0 - 100.0 fL    MCH 28.7  26.0 - 34.0 pg    MCHC 32.7  30.0 - 36.0 g/dL    RDW 78.2  95.6 - 21.3 %    Platelets 309  150 - 400 K/uL    Neutrophils Relative 67  43 - 77 %    Neutro Abs 8.3 (*) 1.7 - 7.7 K/uL    Lymphocytes Relative 25  12 - 46 %    Lymphs Abs 3.1  0.7 - 4.0 K/uL    Monocytes Relative 7  3 - 12 %    Monocytes Absolute 0.8  0.1 - 1.0 K/uL    Eosinophils Relative 1  0 - 5 %    Eosinophils Absolute 0.1  0.0 - 0.7 K/uL    Basophils Relative 0  0 - 1 %    Basophils Absolute 0.0  0.0 - 0.1 K/uL   BASIC METABOLIC PANEL     Status: Normal   Collection Time   09/17/12  6:17 PM      Component Value Range Comment   Sodium 140  135 - 145 mEq/L    Potassium 3.8  3.5 - 5.1 mEq/L    Chloride 103  96 - 112 mEq/L    CO2 27  19 - 32 mEq/L    Glucose, Bld 89  70 - 99 mg/dL    BUN 13  6 - 23 mg/dL    Creatinine, Ser 2.13  0.50 - 1.10 mg/dL    Calcium 9.7  8.4 - 08.6 mg/dL    GFR calc non Af Amer >90  >90 mL/min    GFR calc Af Amer >90  >90 mL/min     Radiological Exams on Admission: No results found.  Assessment/Plan Active Problems:  1. Constipation, chronic: Patient has a known history of chronic ileus/Constipation, and is s/p multiple hospitalizations for this. She now appears to have another flare up of this issue. She will be admitted of decompression, with rectal tube, management with enemas, Reglan and possible adjustment of bowel regimen. For completeness, we shall check an abdominal X-ray, although patient has no tenderness or vomiting.  2. DM (diabetes mellitus): This appears controlled, based on random blood glucose. According to patient's sister, home CBGs range form 68-74. We shall monitor CBGs, and manage with SSI for now. 3. Agitation: This reportedly, has worsened over the past 3 weeks, and as a matter of fact, ptaient required iv Ativan in the ED, with good effect. We shall manage with prn Ativan, but patient may need  psychiatric input, during her hospitalization. Urinalysis is negative. Will check CXR.  4. Uncontrolled HTN: This is likely due to non-compliance with medication, in the past 3 days. Will address with Clonidine patch, and prn iv Hydralazine, for now. Further adjustments will be made as indicated.   Further management will depend on clinical course.   Comment: Patient is FULL CODE.   Time Spent on Admission: 45 mins.   Ace Bergfeld,CHRISTOPHER 09/17/2012, 10:52 PM

## 2012-09-17 NOTE — ED Provider Notes (Signed)
History     CSN: [id number]  Arrival date & time 09/17/12  1630   First MD Initiated Contact with Patient 09/17/12 1725      Chief Complaint  Patient presents with  . Hypertension  . Aggressive Behavior    (Consider location/radiation/quality/duration/timing/severity/associated sxs/prior treatment) The history is provided by the patient, a caregiver and a relative.   patient is a 61 year old female is brought in by her caregiver or family member she has a history of diabetes hypertension chronic constipation had a stroke in the past and has minimal deficits. Patient smells status is at baseline family brought her in because of increased agitation and aggression this is happened in the past where bellies gotten over distended she has not wanted to take her medications so she's been off her hypertensive meds. Blood pressure was getting very high at home which alarmed the family. No nausea no vomiting no of chest pain no Will complaint abdominal pain however she does have marked abdominal distention is the worst over several months. Patient was admitted back in March for the chronic constipation and was cleaned out with a rectal tube.  History is limited due to the patient's baseline mental status.  Level V caveat applies.  Past Medical History  Diagnosis Date  . Hypertension   . Stroke   . Diabetes mellitus   . Chronic constipation     Past Surgical History  Procedure Date  . No past surgeries     History reviewed. No pertinent family history.  History  Substance Use Topics  . Smoking status: Never Smoker   . Smokeless tobacco: Never Used  . Alcohol Use: Yes     occasional wine    OB History    Grav Para Term Preterm Abortions TAB SAB Ect Mult Living                  Review of Systems  Unable to perform ROS Gastrointestinal: Positive for abdominal distention.  Psychiatric/Behavioral: Positive for agitation.   otherwise limited level V caveat applies to patients  baseline mental status.  Allergies  Review of patient's allergies indicates no known allergies.  Home Medications   Current Outpatient Rx  Name Route Sig Dispense Refill  . CARVEDILOL 25 MG PO TABS Oral Take 1 tablet (25 mg total) by mouth 2 (two) times daily with a meal. 60 tablet 0  . INSULIN GLARGINE 100 UNIT/ML [place] SOLN Subcutaneous Inject 10-20 Units into the skin at bedtime. 1O units if sugar is greater than 79 and 20 units if sugar is greater than 90    . LISINOPRIL 20 MG PO TABS Oral Take 2 tablets (40 mg total) by mouth daily. 60 tablet 0  . METFORMIN HCL ER 500 MG PO TB24 Oral Take 1 tablet (500 mg total) by mouth 2 (two) times daily with a meal. 60 tablet 0    Start with taking 1 tablet with breakfast once dai ...  . THERA M PLUS PO TABS Oral Take 1 tablet by mouth daily.      Bernadette Hoit SODIUM 8.6-50 MG PO TABS Oral Take 1 tablet by mouth 2 (two) times daily. 60 tablet 9    BP 125/68  Pulse 81  Temp 97.7 F (36.5 C) (Oral)  Resp 20  Ht 5' (1.524 m)  Wt 132 lb (59.875 kg)  BMI 25.78 kg/m2  SpO2 97%  Physical Exam  Nursing note and vitals reviewed. Constitutional: She appears well-developed and well-nourished.  HENT:  Head: Normocephalic  and atraumatic.  Eyes: Conjunctivae normal and EOM are normal. Pupils are equal, round, and reactive to light.  Neck: Normal range of motion.  Cardiovascular: Normal rate, regular rhythm and normal heart sounds.   Pulmonary/Chest: Effort normal and breath sounds normal.  Abdominal: She exhibits distension. There is no tenderness.       Markedly distended abdomen nontender not consistent with ascites.  Musculoskeletal: Normal range of motion.  Neurological: She is alert. No cranial nerve deficit.  Skin: Skin is warm. No rash noted.    ED Course  Procedures (including critical care time)  Labs Reviewed  URINALYSIS, ROUTINE W REFLEX MICROSCOPIC - Abnormal; Notable for the following:    Bilirubin Urine SMALL (*)      Protein, ur 30 (*)     Leukocytes, UA SMALL (*)     All other components within normal limits  CBC WITH DIFFERENTIAL - Abnormal; Notable for the following:    WBC 12.4 (*)     Neutro Abs 8.3 (*)     All other components within normal limits  GLUCOSE, CAPILLARY  BASIC METABOLIC PANEL  URINE MICROSCOPIC-ADD ON    Date: 09/17/2012  Rate: 109  Rhythm: sinus tachycardia  QRS Axis: normal  Intervals: normal  ST/T Wave abnormalities: early repolarization  Conduction Disutrbances:none  Narrative Interpretation:   Old EKG Reviewed: none available   Results for orders placed during the hospital encounter of 09/17/12  URINALYSIS, ROUTINE W REFLEX MICROSCOPIC      Component Value Range   Color, Urine YELLOW  YELLOW   APPearance CLEAR  CLEAR   Specific Gravity, Urine 1.022  1.005 - 1.030   pH 6.0  5.0 - 8.0   Glucose, UA NEGATIVE  NEGATIVE mg/dL   Hgb urine dipstick NEGATIVE  NEGATIVE   Bilirubin Urine SMALL (*) NEGATIVE   Ketones, ur NEGATIVE  NEGATIVE mg/dL   Protein, ur 30 (*) NEGATIVE mg/dL   Urobilinogen, UA 1.0  0.0 - 1.0 mg/dL   Nitrite NEGATIVE  NEGATIVE   Leukocytes, UA SMALL (*) NEGATIVE  GLUCOSE, CAPILLARY      Component Value Range   Glucose-Capillary 82  70 - 99 mg/dL  CBC WITH DIFFERENTIAL      Component Value Range   WBC 12.4 (*) 4.0 - 10.5 K/uL   RBC 4.67  3.87 - 5.11 MIL/uL   Hemoglobin 13.4  12.0 - 15.0 g/dL   HCT 16.1  09.6 - 04.5 %   MCV 87.8  78.0 - 100.0 fL   MCH 28.7  26.0 - 34.0 pg   MCHC 32.7  30.0 - 36.0 g/dL   RDW 40.9  81.1 - 91.4 %   Platelets 309  150 - 400 K/uL   Neutrophils Relative 67  43 - 77 %   Neutro Abs 8.3 (*) 1.7 - 7.7 K/uL   Lymphocytes Relative 25  12 - 46 %   Lymphs Abs 3.1  0.7 - 4.0 K/uL   Monocytes Relative 7  3 - 12 %   Monocytes Absolute 0.8  0.1 - 1.0 K/uL   Eosinophils Relative 1  0 - 5 %   Eosinophils Absolute 0.1  0.0 - 0.7 K/uL   Basophils Relative 0  0 - 1 %   Basophils Absolute 0.0  0.0 - 0.1 K/uL  BASIC METABOLIC  PANEL      Component Value Range   Sodium 140  135 - 145 mEq/L   Potassium 3.8  3.5 - 5.1 mEq/L   Chloride 103  96 - 112 mEq/L   CO2 27  19 - 32 mEq/L   Glucose, Bld 89  70 - 99 mg/dL   BUN 13  6 - 23 mg/dL   Creatinine, Ser 1.61  0.50 - 1.10 mg/dL   Calcium 9.7  8.4 - 09.6 mg/dL   GFR calc non Af Amer >90  >90 mL/min   GFR calc Af Amer >90  >90 mL/min  URINE MICROSCOPIC-ADD ON      Component Value Range   Squamous Epithelial / LPF RARE  RARE   WBC, UA 3-6  <3 WBC/hpf   Bacteria, UA RARE  RARE     No results found.   1. Obstipation       MDM  Patient said her normal mental status baseline. She has a history of chronic constipation presents with marked abdominal distention. Original he was very hypertensive family noted that should they have been able to take her medicines but which is Ativan she settled down nicely and her blood pressure actually returned to normal. Patient was admitted in March for when her belly was severely distended like this no nausea no vomiting no tenderness just significant distention and rectal tubes placed and had a stool cleaned out. She has a history of a colon that does not function properly.  Discussed with hospitalist meeting team they will admit her to MedSurg. At their request an acute abdominal series has been ordered. Temporary admit orders done.         Shelda Jakes, MD 09/17/12 2232

## 2012-09-17 NOTE — ED Notes (Signed)
Pt sister/cargegiver states that over the past 3-4 weeks pt has been more aggressive and refusing to take medications. Pt hypertensive on arrival. Pt alert per norm at present.

## 2012-09-17 NOTE — ED Notes (Signed)
Pt daugther reports she is pts caregiver, pt had stroke when she was a child and that pt has the mental ability of a 61 year old. Sister reports pt is refusing to take PO meds, even if put in juice. Pt takes 2 enemas per week because pt has had colon removed. pt was not allowed to get colostomy bag due to mental retardation but pt refusing those. pts abdomen very distended. Pt is spitting on people and being combative.   Hx of diabetes, HTN, and sister is not sure if pt is starting to have dementia. Pt has been acting confused lately. Sister has taken care of pt for 10 years.

## 2012-09-18 DIAGNOSIS — K5900 Constipation, unspecified: Principal | ICD-10-CM

## 2012-09-18 LAB — CBC
HCT: 36.7 % (ref 36.0–46.0)
HCT: 39.5 % (ref 36.0–46.0)
MCH: 28.3 pg (ref 26.0–34.0)
MCH: 28.7 pg (ref 26.0–34.0)
MCHC: 32.2 g/dL (ref 30.0–36.0)
MCV: 88.2 fL (ref 78.0–100.0)
MCV: 88.4 fL (ref 78.0–100.0)
Platelets: 265 10*3/uL (ref 150–400)
Platelets: 300 10*3/uL (ref 150–400)
RBC: 4.15 MIL/uL (ref 3.87–5.11)
RDW: 13.5 % (ref 11.5–15.5)
RDW: 13.6 % (ref 11.5–15.5)
WBC: 9.9 10*3/uL (ref 4.0–10.5)

## 2012-09-18 LAB — GLUCOSE, CAPILLARY
Glucose-Capillary: 73 mg/dL (ref 70–99)
Glucose-Capillary: 75 mg/dL (ref 70–99)
Glucose-Capillary: 82 mg/dL (ref 70–99)

## 2012-09-18 LAB — COMPREHENSIVE METABOLIC PANEL
ALT: 18 U/L (ref 0–35)
Albumin: 3.3 g/dL — ABNORMAL LOW (ref 3.5–5.2)
Alkaline Phosphatase: 85 U/L (ref 39–117)
Chloride: 105 mEq/L (ref 96–112)
Potassium: 3.7 mEq/L (ref 3.5–5.1)
Sodium: 141 mEq/L (ref 135–145)
Total Bilirubin: 0.3 mg/dL (ref 0.3–1.2)
Total Protein: 6.6 g/dL (ref 6.0–8.3)

## 2012-09-18 LAB — CREATININE, SERUM: GFR calc Af Amer: >90 mL/min (ref >90)

## 2012-09-18 LAB — MAGNESIUM: Magnesium: 2 mg/dL (ref 1.5–2.5)

## 2012-09-18 MED ORDER — INSULIN ASPART 100 UNIT/ML ~~LOC~~ SOLN: 0.0000 [IU] | Freq: Three times a day (TID) | SUBCUTANEOUS | Status: DC

## 2012-09-18 MED ORDER — POLYETHYLENE GLYCOL 3350 17 G PO PACK
17.0000 g | PACK | Freq: Every day | ORAL | Status: DC
  Administered 2012-09-19 – 2012-09-21 (×3): 17 g via ORAL
  Filled 2012-09-18 (×4): qty 1

## 2012-09-18 MED ORDER — ENSURE COMPLETE PO LIQD
237.0000 mL | Freq: Two times a day (BID) | ORAL | Status: DC
  Administered 2012-09-18 – 2012-09-20 (×3): 237 mL via ORAL

## 2012-09-18 MED ORDER — INSULIN ASPART 100 UNIT/ML ~~LOC~~ SOLN: 0.0000 [IU] | Freq: Every day | SUBCUTANEOUS | Status: DC

## 2012-09-18 NOTE — Progress Notes (Signed)
INITIAL ADULT NUTRITION ASSESSMENT Date: 09/18/2012   Time: 1:57 PM Reason for Assessment: Nutrition risk   INTERVENTION: Ensure Complete BID. Encouraged increased intake. Will monitor.    ASSESSMENT: Female 61 y.o.  Dx: Constipation, chronic  Food/Nutrition Related Hx: Pt with mental retardation, pt's sister who is her caregiver, present at bedside. She reports pt eats "too much" at home, 3-4 meals/day and getting up at night for snacks, however pt's weight has dropped 50 pounds in the past 4 months unintentionally, however past records show pt's weight was 110 pounds in March of this year, it may be unlikely that she gained 50 pounds and then lost it in such a short time. She reports she does not understand why pt keeps losing weight because she is eating so well. She states pt consumes mostly soft foods like baked chicken, softened vegetables, etc. She states pt does not have any problems chewing or swallowing. She reports pt has been difficult recently r/t refusing to take her pills for the past 2 weeks. She reports pt did not want any of her breakfast this morning. She is interested in pt getting Ensure for additional nutrition. Pt with distended abdomen in severe obstipation per MD.   Hx:  Past Medical History  Diagnosis Date  . Hypertension   . Stroke   . Diabetes mellitus   . Chronic constipation    Related Meds:  Scheduled Meds:   . bisacodyl  10 mg Rectal Daily  . cloNIDine  0.2 mg Transdermal Weekly  . heparin  5,000 Units Subcutaneous Q8H  . insulin aspart  0-5 Units Subcutaneous QHS  . insulin aspart  0-9 Units Subcutaneous TID WC  . LORazepam  1 mg Intravenous Once  . metoCLOPramide (REGLAN) injection  10 mg Intravenous Q6H  . polyethylene glycol  17 g Oral Daily  . sodium chloride  3 mL Intravenous Q12H  . DISCONTD: insulin aspart  0-9 Units Subcutaneous Q4H   Continuous Infusions:   . sodium chloride 100 mL/hr at 09/18/12 1044   PRN Meds:.sodium chloride,  acetaminophen, acetaminophen, hydrALAZINE, LORazepam, morphine injection, ondansetron (ZOFRAN) IV, ondansetron, sodium chloride, sodium phosphate  Ht: 5' (152.4 cm)  Wt: 109 lb 5.6 oz (49.6 kg)  Ideal Wt: 100 lb % Ideal Wt: 109  Usual Wt: 159 lb per family report % Usual Wt: 68  Wt Readings from Last 10 Encounters:  09/17/12 109 lb 5.6 oz (49.6 kg)  02/27/12 110 lb (49.896 kg)  12/10/11 110 lb 9.6 oz (50.168 kg)  10/24/11 114 lb 10.2 oz (52 kg)     Body mass index is 21.36 kg/(m^2).   Labs:  CMP     Component Value Date/Time   NA 141 09/18/2012 0336   K 3.7 09/18/2012 0336   CL 105 09/18/2012 0336   CO2 28 09/18/2012 0336   GLUCOSE 73 09/18/2012 0336   BUN 11 09/18/2012 0336   CREATININE 0.56 09/18/2012 0336   CALCIUM 9.1 09/18/2012 0336   PROT 6.6 09/18/2012 0336   ALBUMIN 3.3* 09/18/2012 0336   AST 22 09/18/2012 0336   ALT 18 09/18/2012 0336   ALKPHOS 85 09/18/2012 0336   BILITOT 0.3 09/18/2012 0336   GFRNONAA >90 09/18/2012 0336   GFRAA >90 09/18/2012 0336   Lab Results  Component Value Date   HGBA1C 6.4* 05/18/2011   CBG (last 3)   Basename 09/18/12 1156 09/18/12 0811 09/18/12 0452  GLUCAP 95 114* 73      Intake/Output Summary (Last 24 hours) at 09/18/12 1400  Last data filed at 09/18/12 0600  Gross per 24 hour  Intake    600 ml  Output      1 ml  Net    599 ml   Last BM - 10/10  Diet Order: Carb Control   IVF:    sodium chloride Last Rate: 100 mL/hr at 09/18/12 1044    Estimated Nutritional Needs:   Kcal:1600-1800 Protein:60-75g Fluid:1.6-1.8L  NUTRITION DIAGNOSIS: -Inadequate oral intake (NI-2.1).  Status: Ongoing  RELATED TO: pt refusing to eat  AS EVIDENCE BY: sister's statement  MONITORING/EVALUATION(Goals): Pt to consume >75% of meals/supplements.   EDUCATION NEEDS: -Education needs addressed - used teach back method in educating sister on high calorie/protein nutrition therapy and provided handout of this information.     Dietitian #: [phone]  DOCUMENTATION CODES Per approved criteria  -Not Applicable    Marshall Cork 09/18/2012, 1:57 PM

## 2012-09-18 NOTE — Progress Notes (Signed)
Fleet Enema given for constipation. Pt was able to hold the enema for several minutes and passed a large amount of stool. Abd feels softer after the enema but is still very distended.

## 2012-09-18 NOTE — Progress Notes (Signed)
Called to Pts. Room by Zina(sister). Pt up on [facility] & expelled large amount liquid light brown stool. Abd less distended & firm.Hartley Barefoot

## 2012-09-18 NOTE — Plan of Care (Signed)
Problem: Consults Goal: General Medical Patient Education See Patient Education Module for specific education.  Outcome: Completed/Met Date Met:  09/18/12 family

## 2012-09-18 NOTE — Progress Notes (Signed)
Rectal tube was placed, but it came out unintentionally. Attempted to replace rectal tube and pt resisted the attempt. Pt kept clinching despite attempts to get her to relax. Unable to reinsert rectal tube, and pt's sister requested that we try again later when pt is less agitated. Donnamarie Poag, NP, notified.

## 2012-09-18 NOTE — Progress Notes (Signed)
[facility] PROGRESS NOTE  LILYIAN QUAYLE ZOX:[id number] DOB: 03/19/1951 DOA: 09/17/2012 PCP: Paulino Rily, MD  Brief narrative: [name] is a 61 year old female, with a PMH of HTN, type 2 DM, previous CVA, mental retardation and chronic colonic ileus s/p multiple admissions in the past, for recurrent ileus and abdominal distention, requiring rectal tube for decompression. Last hospitalization was 02/26/12-02/28/12, for severe constipation. Patient was brought in to the ED 09/17/12, by her sister because of increasing agitation/combativeness, difficulty administering medications, as well as progressive abdominal discomfort and swelling, over the past 3 days. She has had some results status post soap suds enema, but abdomen continues to be distended and firm over usual baseline.  Assessment/Plan: Principal Problem:  *Constipation, chronic  Responded to a soap suds enema, but abdomen still markedly distended.  Continue Reglan.  Start daily MiraLax.  Give another soap suds enema. Active Problems:  HTN (hypertension)  BP controlled on Clonidine patch and PRN hydralazine.  Encephalopathy /  Agitation  Secondary to hypertension and obstipation.  Improving.  Ativan PRN.  DM (diabetes mellitus)  CBGs 73-158.  Change SSI from Q 4 hours to Q AC and HS.  Advance diet to CM.    Code Status: Full. Family Communication: Sister Zina at bedside.  (Tel: [phone]) Disposition Plan: Home when stable.   Medical Consultants:  None.  Other Consultants:  None.  Procedures:  None.  Antibiotics:  None.  HPI/Subjective: Ms. Mcguinn is non-verbal.  Her sister is at the bedside, and reports that her abdomen is still quite distended over usual baseline appearance.    Objective: Filed Vitals:   09/17/12 2058 09/17/12 2300 09/17/12 2325 09/18/12 0535  BP: 125/68  153/93 163/88  Pulse: 81  87 85  Temp: 97.7 F (36.5 C)  98.2 F (36.8 C) 97.6 F (36.4 C)  TempSrc: Oral   Oral Oral  Resp: 20  20 20   Height:  5' (1.524 m)    Weight:  49.6 kg (109 lb 5.6 oz)    SpO2: 97%  100% 98%    Intake/Output Summary (Last 24 hours) at 09/18/12 1037 Last data filed at 09/18/12 0600  Gross per 24 hour  Intake    600 ml  Output      1 ml  Net    599 ml    Exam: Gen:  NAD Cardiovascular:  RRR, No M/R/G Respiratory: Lungs CTAB Gastrointestinal: Abdomen markedly distended, firm,  with normal active bowel sounds. Extremities: No C/E/C  Data Reviewed: Basic Metabolic Panel:  Lab 09/18/12 5621 09/18/12 0010 09/17/12 1817  NA 141 -- 140  K 3.7 -- 3.8  CL 105 -- 103  CO2 28 -- 27  GLUCOSE 73 -- 89  BUN 11 -- 13  CREATININE 0.56 0.56 0.67  CALCIUM 9.1 -- 9.7  MG -- 2.0 --  PHOS -- 3.1 --   GFR Estimated Creatinine Clearance: 54.4 ml/min (by C-G formula based on Cr of 0.56). Liver Function Tests:  Lab 09/18/12 0336  AST 22  ALT 18  ALKPHOS 85  BILITOT 0.3  PROT 6.6  ALBUMIN 3.3*    CBC:  Lab 09/18/12 0336 09/18/12 0010 09/17/12 1817  WBC 8.9 9.9 12.4*  NEUTROABS -- -- 8.3*  HGB 12.7 11.9* 13.4  HCT 39.5 36.7 41.0  MCV 88.2 88.4 87.8  PLT 300 265 309   CBG:  Lab 09/18/12 0811 09/18/12 0452 09/18/12 0450 09/17/12 2345 09/17/12 1739  GLUCAP 114* 73 75 158* 82   Studies:  Dg  Abd Acute W/chest 09/17/2012  IMPRESSION: 1.  Chronic decreased colonic inertia.  Stable bowel gas pattern. No definite retained stool in the sigmoid colon or rectum. 2.  No free air. 3.  Lower lung volumes with mild basilar atelectasis.   Original Report Authenticated By: Harley Hallmark, M.D.     Scheduled Meds:    . bisacodyl  10 mg Rectal Daily  . cloNIDine  0.2 mg Transdermal Weekly  . heparin  5,000 Units Subcutaneous Q8H  . insulin aspart  0-9 Units Subcutaneous Q4H  . LORazepam  1 mg Intravenous Once  . metoCLOPramide (REGLAN) injection  10 mg Intravenous Q6H  . sodium chloride  3 mL Intravenous Q12H   Continuous Infusions:    . sodium chloride 100 mL/hr  at 09/18/12 0037    Time spent: 25 minutes.   LOS: 1 day   RAMA,CHRISTINA  [facility] Pager [phone].  If 8PM-8AM, please contact night-coverage at [email or web address], password [facility] 09/18/2012, 10:37 AM

## 2012-09-18 NOTE — Progress Notes (Signed)
SSE given at 1500. No return after. Checked rectal vault. Sphincter very tight, no formed stool felt in rectal vault. Will continue to monitor.Hartley Barefoot

## 2012-09-19 LAB — GLUCOSE, CAPILLARY
Glucose-Capillary: 116 mg/dL — ABNORMAL HIGH (ref 70–99)
Glucose-Capillary: 120 mg/dL — ABNORMAL HIGH (ref 70–99)
Glucose-Capillary: 79 mg/dL (ref 70–99)

## 2012-09-19 NOTE — Progress Notes (Signed)
[facility] PROGRESS NOTE  [name] FAO:[id number] DOB: 1951/06/29 DOA: 09/17/2012 PCP: Paulino Rily, MD  Brief narrative: [name] is a 61 year old female, with a PMH of HTN, type 2 DM, previous CVA, mental retardation and chronic colonic ileus s/p multiple admissions in the past, for recurrent ileus and abdominal distention, requiring rectal tube for decompression. Last hospitalization was 02/26/12-02/28/12, for severe constipation. Patient was brought in to the ED 09/17/12, by [name] because of increasing agitation/combativeness, difficulty administering medications, as well as progressive abdominal discomfort and swelling, over the past 3 days. She has had some results status post soap suds enema, but abdomen continues to be distended and firm over usual baseline.  Assessment/Plan: Principal Problem:  *Constipation, chronic  Responded to soap suds enemas x 2, but abdomen still distended.  Now has rectal tube in place.  Continue Reglan.  Start daily MiraLax.     Active Problems: HTN (hypertension)  BP controlled on Clonidine patch and PRN hydralazine.  Encephalopathy /  Agitation  Secondary to hypertension and obstipation.  Improving.  Ativan PRN.  DM (diabetes mellitus)  CBGs 79-120.  Continue SSI Q AC and HS.      Code Status: Full. Family Communication: [name] (Tel: [phone]), not at bedside today. Disposition Plan: Home when stable.   Medical Consultants:  None.  Other Consultants:  None.  Procedures:  None.  Antibiotics:  None.  HPI/Subjective: [name] remains minimally verbal.  No N/V.  Rectal tube in place.  Objective: Filed Vitals:   09/18/12 2245 09/19/12 0606 09/19/12 1215 09/19/12 1404  BP: 168/90 170/97 167/72 127/62  Pulse:  91 82 103  Temp:  97.9 F (36.6 C)  98.5 F (36.9 C)  TempSrc:  Oral  Oral  Resp:  20  22  Height:      Weight:      SpO2:  100%  100%    Intake/Output Summary (Last 24 hours) at  09/19/12 1745 Last data filed at 09/19/12 0700  Gross per 24 hour  Intake 2171.67 ml  Output   1700 ml  Net 471.67 ml    Exam: Gen:  NAD Cardiovascular:  RRR, No M/R/G Respiratory: Lungs CTAB Gastrointestinal: Abdomen slightly less distended, firm,  with normal active bowel sounds. Extremities: No C/E/C  Data Reviewed: Basic Metabolic Panel:  Lab 09/18/12 3244 09/18/12 0010 09/17/12 1817  NA 141 -- 140  K 3.7 -- 3.8  CL 105 -- 103  CO2 28 -- 27  GLUCOSE 73 -- 89  BUN 11 -- 13  CREATININE 0.56 0.56 0.67  CALCIUM 9.1 -- 9.7  MG -- 2.0 --  PHOS -- 3.1 --   GFR Estimated Creatinine Clearance: 54.4 ml/min (by C-G formula based on Cr of 0.56). Liver Function Tests:  Lab 09/18/12 0336  AST 22  ALT 18  ALKPHOS 85  BILITOT 0.3  PROT 6.6  ALBUMIN 3.3*    CBC:  Lab 09/18/12 0336 09/18/12 0010 09/17/12 1817  WBC 8.9 9.9 12.4*  NEUTROABS -- -- 8.3*  HGB 12.7 11.9* 13.4  HCT 39.5 36.7 41.0  MCV 88.2 88.4 87.8  PLT 300 265 309   CBG:  Lab 09/19/12 1700 09/19/12 1149 09/19/12 0742 09/19/12 0013 09/18/12 2133  GLUCAP 95 108* 120* 79 82   Studies:  Dg Abd Acute W/chest 09/17/2012  IMPRESSION: 1.  Chronic decreased colonic inertia.  Stable bowel gas pattern. No definite retained stool in the sigmoid colon or rectum. 2.  No free air. 3.  Lower lung volumes with mild basilar atelectasis.   Original Report Authenticated By: Harley Hallmark, M.D.     Scheduled Meds:    . bisacodyl  10 mg Rectal Daily  . cloNIDine  0.2 mg Transdermal Weekly  . feeding supplement  237 mL Oral BID BM  . heparin  5,000 Units Subcutaneous Q8H  . insulin aspart  0-5 Units Subcutaneous QHS  . insulin aspart  0-9 Units Subcutaneous TID WC  . metoCLOPramide (REGLAN) injection  10 mg Intravenous Q6H  . polyethylene glycol  17 g Oral Daily  . sodium chloride  3 mL Intravenous Q12H   Continuous Infusions:    . sodium chloride 100 mL/hr at 09/19/12 1615    Time spent: 25 minutes.   LOS:  2 days   RAMA,CHRISTINA  [facility] Pager [phone].  If 8PM-8AM, please contact night-coverage at [email or web address], password [facility] 09/19/2012, 5:45 PM

## 2012-09-19 NOTE — Progress Notes (Signed)
CARE MANAGEMENT NOTE 09/19/2012  Patient:  [name], [name]   Account Number:  [id number]  Date Initiated:  09/19/2012  Documentation initiated by:  PEARSON,COOKIE  Subjective/Objective Assessment:   Pt admitted with cco Altered Mental status, severe obstipation with hx of mental retardation     Action/Plan:   from home with sister [name] [phone]   Anticipated DC Date:  09/20/2012   Anticipated DC Plan:  HOME/SELF CARE      DC Planning Services  CM consult      Choice offered to / List presented to:             Status of service:  In process, will continue to follow Medicare Important Message given?  NA - LOS <3 / Initial given by admissions (If response is "NO", the following Medicare IM given date Leather will be blank) Date Medicare IM given:   Date Additional Medicare IM given:    Discharge Disposition:  HOME/SELF CARE  Per UR Regulation:  Reviewed for med. necessity/level of care/duration of stay  If discussed at Long Length of Stay Meetings, dates discussed:    Comments:  09/19/12 MPearson, RN, BSN A call to sister [name] [phone] made. [name] could not talk with me at that time and was coming to visit pt.  I have not seen sister [name] to talk to her about Private Duty Nursing/Home Health, however will leave a list in pt's room for sister [name].

## 2012-09-19 NOTE — Progress Notes (Signed)
Inserted flexi-seal per MD order; large amounts of gas filled gas; abdomen is already soft and patient feels less distended; pt resting at this time; sister at bedside; will notify MD of results

## 2012-09-20 LAB — GLUCOSE, CAPILLARY: Glucose-Capillary: 92 mg/dL (ref 70–99)

## 2012-09-20 MED ORDER — METFORMIN HCL ER 500 MG PO TB24
500.0000 mg | ORAL_TABLET | Freq: Two times a day (BID) | ORAL | Status: DC
  Administered 2012-09-20 – 2012-09-21 (×2): 500 mg via ORAL
  Filled 2012-09-20 (×4): qty 1

## 2012-09-20 MED ORDER — LORAZEPAM 1 MG PO TABS
1.0000 mg | ORAL_TABLET | Freq: Four times a day (QID) | ORAL | Status: DC | PRN
  Administered 2012-09-20 – 2012-09-21 (×3): 1 mg via ORAL
  Filled 2012-09-20 (×3): qty 1

## 2012-09-20 MED ORDER — CARVEDILOL 25 MG PO TABS
25.0000 mg | ORAL_TABLET | Freq: Two times a day (BID) | ORAL | Status: DC
  Administered 2012-09-20 – 2012-09-21 (×2): 25 mg via ORAL
  Filled 2012-09-20 (×4): qty 1

## 2012-09-20 MED ORDER — LISINOPRIL 40 MG PO TABS
40.0000 mg | ORAL_TABLET | Freq: Every day | ORAL | Status: DC
  Administered 2012-09-20 – 2012-09-21 (×2): 40 mg via ORAL
  Filled 2012-09-20 (×2): qty 1

## 2012-09-20 MED ORDER — METOCLOPRAMIDE HCL 10 MG PO TABS
10.0000 mg | ORAL_TABLET | Freq: Three times a day (TID) | ORAL | Status: DC
  Administered 2012-09-20 – 2012-09-21 (×3): 10 mg via ORAL
  Filled 2012-09-20 (×5): qty 1

## 2012-09-20 NOTE — Progress Notes (Signed)
[facility] PROGRESS NOTE  KIMM SIDER ZOX:[id number] DOB: Jan 12, 1951 DOA: 09/17/2012 PCP: Paulino Rily, MD  Brief narrative: [name] is a 61 year old female, with a PMH of HTN, type 2 DM, previous CVA, mental retardation and chronic colonic ileus s/p multiple admissions in the past, for recurrent ileus and abdominal distention, requiring rectal tube for decompression. Last hospitalization was 02/26/12-02/28/12, for severe constipation. Patient was brought in to the ED 09/17/12, by her sister because of increasing agitation/combativeness, difficulty administering medications, as well as progressive abdominal discomfort and swelling, over the past 3 days. She has had some results status post soap suds enema, and abdomen now much softer after rectal tube placed.  Assessment/Plan: Principal Problem:  *Constipation, chronic  Responded to soap suds enemas x 2, and rectal tube.  Continue Reglan (change to PO) and daily MiraLax.     Active Problems: HTN (hypertension)  BP controlled on Clonidine patch and PRN hydralazine.  Resume home medications and D/C clonidine patch.  Encephalopathy /  Agitation  Secondary to hypertension and obstipation.  Back to baseline.  DM (diabetes mellitus)  CBGs 92-120.  Continue SSI Q AC and HS.  Resume Metformin.    Code Status: Full. Family Communication: Sister Rexene Edison (Tel: [phone]), updated at bedside today. Disposition Plan: Home when stable.   Medical Consultants:  None.  Other Consultants:  None.  Procedures:  None.  Antibiotics:  None.  HPI/Subjective: Ms. [name] is more verbal today.  Wants to go home.  No N/V.  Rectal tube in place, draining liquid brown stool.  Pulled out IV.  Hungry and now eating.  Objective: Filed Vitals:   09/19/12 1215 09/19/12 1404 09/19/12 2109 09/20/12 0512  BP: 167/72 127/62 144/72 150/85  Pulse: 82 103 104 98  Temp:  98.5 F (36.9 C) 98.5 F (36.9 C) 98.5 F (36.9 C)    TempSrc:  Oral Oral Oral  Resp:  22 20 20   Height:      Weight:      SpO2:  100% 100% 98%    Intake/Output Summary (Last 24 hours) at 09/20/12 1118 Last data filed at 09/20/12 0916  Gross per 24 hour  Intake    660 ml  Output    900 ml  Net   -240 ml    Exam: Gen:  NAD Cardiovascular:  RRR, No M/R/G Respiratory: Lungs CTAB Gastrointestinal: Abdomen much less distended, soft,  with normal active bowel sounds. Extremities: No C/E/C  Data Reviewed: Basic Metabolic Panel:  Lab 09/18/12 5621 09/18/12 0010 09/17/12 1817  NA 141 -- 140  K 3.7 -- 3.8  CL 105 -- 103  CO2 28 -- 27  GLUCOSE 73 -- 89  BUN 11 -- 13  CREATININE 0.56 0.56 0.67  CALCIUM 9.1 -- 9.7  MG -- 2.0 --  PHOS -- 3.1 --   GFR Estimated Creatinine Clearance: 54.4 ml/min (by C-G formula based on Cr of 0.56). Liver Function Tests:  Lab 09/18/12 0336  AST 22  ALT 18  ALKPHOS 85  BILITOT 0.3  PROT 6.6  ALBUMIN 3.3*    CBC:  Lab 09/18/12 0336 09/18/12 0010 09/17/12 1817  WBC 8.9 9.9 12.4*  NEUTROABS -- -- 8.3*  HGB 12.7 11.9* 13.4  HCT 39.5 36.7 41.0  MCV 88.2 88.4 87.8  PLT 300 265 309   CBG:  Lab 09/20/12 0837 09/19/12 2107 09/19/12 1700 09/19/12 1149 09/19/12 0742  GLUCAP 92 116* 95 108* 120*   Studies:  Dg Abd Acute W/chest 09/17/2012  IMPRESSION: 1.  Chronic decreased colonic inertia.  Stable bowel gas pattern. No definite retained stool in the sigmoid colon or rectum. 2.  No free air. 3.  Lower lung volumes with mild basilar atelectasis.   Original Report Authenticated By: Harley Hallmark, M.D.     Scheduled Meds:    . bisacodyl  10 mg Rectal Daily  . cloNIDine  0.2 mg Transdermal Weekly  . feeding supplement  237 mL Oral BID BM  . heparin  5,000 Units Subcutaneous Q8H  . insulin aspart  0-5 Units Subcutaneous QHS  . insulin aspart  0-9 Units Subcutaneous TID WC  . metoCLOPramide (REGLAN) injection  10 mg Intravenous Q6H  . polyethylene glycol  17 g Oral Daily  . sodium chloride   3 mL Intravenous Q12H   Continuous Infusions:    . sodium chloride 100 mL/hr at 09/20/12 0016    Time spent: 25 minutes.   LOS: 3 days   RAMA,CHRISTINA  [facility] Pager [phone].  If 8PM-8AM, please contact night-coverage at [email or web address], password [facility] 09/20/2012, 11:18 AM

## 2012-09-21 LAB — GLUCOSE, CAPILLARY
Glucose-Capillary: 96 mg/dL (ref 70–99)
Glucose-Capillary: 85 mg/dL (ref 70–99)

## 2012-09-21 MED ORDER — METOCLOPRAMIDE HCL 5 MG PO TABS: 5.0000 mg | ORAL_TABLET | Freq: Three times a day (TID) | ORAL | Status: DC

## 2012-09-21 MED ORDER — POLYETHYLENE GLYCOL 3350 17 G PO PACK: 17.0000 g | PACK | Freq: Every day | ORAL | Status: DC

## 2012-09-21 MED ORDER — FLEET ENEMA 7-19 GM/118ML RE ENEM: 1.0000 | ENEMA | Freq: Every day | RECTAL | Status: DC | PRN

## 2012-09-21 NOTE — Plan of Care (Signed)
Problem: Phase I Progression Outcomes Goal: OOB as tolerated unless otherwise ordered Outcome: Progressing oob with assist  Goal: Initial discharge plan identified Outcome: Progressing Anticipating discharge 09/21/12  Goal: Voiding-avoid urinary catheter unless indicated Outcome: Completed/Met Date Met:  09/21/12 Using bedside commode  Problem: Phase II Progression Outcomes Goal: Discharge plan established Outcome: Completed/Met Date Met:  09/21/12 Anticipating discharge 09/21/12

## 2012-09-21 NOTE — Progress Notes (Signed)
[facility] PROGRESS NOTE  [name] ZOX:[id number] DOB: 03/30/1951 DOA: 09/17/2012 PCP: Cynthia Rily, MD  Brief narrative: [name] is a 61 year old female, with a PMH of HTN, type 2 DM, previous CVA, mental retardation and chronic colonic ileus s/p multiple admissions in the past, for recurrent ileus and abdominal distention, requiring rectal tube for decompression. Last hospitalization was 02/26/12-02/28/12, for severe constipation. Patient was brought in to the ED 09/17/12, by her sister because of increasing agitation/combativeness, difficulty administering medications, as well as progressive abdominal discomfort and swelling, over the past 3 days. She has had some results status post soap suds enema, and abdomen now much softer after rectal tube placed.  Assessment/Plan: Principal Problem:  *Constipation, chronic  Responded to soap suds enemas x 2, and rectal tube.  D/C rectal tube and monitor.  Continue PO Reglan, daily MiraLax and dulcolax suppository.   Active Problems: HTN (hypertension)  BP under reasonable control on home medications.  Encephalopathy /  Agitation  Secondary to hypertension and obstipation.  Back to baseline. Ativan PRN. DM (diabetes mellitus)  CBGs 84-102.  Continue SSI Q AC and HS and Metformin.    Code Status: Full. Family Communication: Sister [name] (Tel: [phone]), updated at bedside today. Disposition Plan: Home later today or tomorrow.   Medical Consultants:  None.  Other Consultants:  None.  Procedures:  None.  Antibiotics:  None.  HPI/Subjective: [name] was agitated again last night.  Wants rectal tube out.  Appetite improved.  Objective: Filed Vitals:   09/20/12 0512 09/20/12 1357 09/20/12 2239 09/21/12 0557  BP: 150/85 156/75 125/55 118/73  Pulse: 98 94 82 88  Temp: 98.5 F (36.9 C) 99.1 F (37.3 C) 97.7 F (36.5 C) 98.2 F (36.8 C)  TempSrc: Oral Oral Oral Oral  Resp: 20 18 17 17   Height:       Weight:      SpO2: 98% 100% 97% 100%    Intake/Output Summary (Last 24 hours) at 09/21/12 1112 Last data filed at 09/21/12 1032  Gross per 24 hour  Intake    600 ml  Output   1500 ml  Net   -900 ml    Exam: Gen:  NAD Cardiovascular:  RRR, No M/R/G Respiratory: Lungs CTAB Gastrointestinal: Abdomen remains soft, with normal active bowel sounds. Extremities: No C/E/C  Data Reviewed: Basic Metabolic Panel:  Lab 09/18/12 5621 09/18/12 0010 09/17/12 1817  NA 141 -- 140  K 3.7 -- 3.8  CL 105 -- 103  CO2 28 -- 27  GLUCOSE 73 -- 89  BUN 11 -- 13  CREATININE 0.56 0.56 0.67  CALCIUM 9.1 -- 9.7  MG -- 2.0 --  PHOS -- 3.1 --   GFR Estimated Creatinine Clearance: 54.4 ml/min (by C-G formula based on Cr of 0.56). Liver Function Tests:  Lab 09/18/12 0336  AST 22  ALT 18  ALKPHOS 85  BILITOT 0.3  PROT 6.6  ALBUMIN 3.3*    CBC:  Lab 09/18/12 0336 09/18/12 0010 09/17/12 1817  WBC 8.9 9.9 12.4*  NEUTROABS -- -- 8.3*  HGB 12.7 11.9* 13.4  HCT 39.5 36.7 41.0  MCV 88.2 88.4 87.8  PLT 300 265 309   CBG:  Lab 09/21/12 0733 09/20/12 2236 09/20/12 1745 09/20/12 1240 09/20/12 0837  GLUCAP 85 96 84 102* 92   Studies:  Dg Abd Acute W/chest 09/17/2012  IMPRESSION: 1.  Chronic decreased colonic inertia.  Stable bowel gas pattern. No definite retained stool in the sigmoid colon or rectum.  2.  No free air. 3.  Lower lung volumes with mild basilar atelectasis.   Original Report Authenticated By: [name], M.D.     Scheduled Meds:    . bisacodyl  10 mg Rectal Daily  . carvedilol  25 mg Oral BID WC  . feeding supplement  237 mL Oral BID BM  . heparin  5,000 Units Subcutaneous Q8H  . insulin aspart  0-5 Units Subcutaneous QHS  . insulin aspart  0-9 Units Subcutaneous TID WC  . lisinopril  40 mg Oral Daily  . metFORMIN  500 mg Oral BID WC  . metoCLOPramide  10 mg Oral TID AC & HS  . polyethylene glycol  17 g Oral Daily  . DISCONTD: cloNIDine  0.2 mg Transdermal  Weekly  . DISCONTD: metoCLOPramide (REGLAN) injection  10 mg Intravenous Q6H  . DISCONTD: sodium chloride  3 mL Intravenous Q12H   Continuous Infusions:    . DISCONTD: sodium chloride 100 mL/hr at 09/20/12 0016    Time spent: 25 minutes.   LOS: 4 days   [name]  [facility] Pager [phone].  If 8PM-8AM, please contact night-coverage at [email or web address], password [facility] 09/21/2012, 11:12 AM

## 2012-09-21 NOTE — Discharge Summary (Signed)
Physician Discharge Summary  [name] QMV:[id number] DOB: 01-Jul-1951 DOA: 09/17/2012  PCP: Cynthia Rily, MD  Admit date: 09/17/2012 Discharge date: 09/21/2012  Recommendations for Outpatient Follow-up:  1. Call MD if no BM in > 48 hours.  Discharge Diagnoses:   Principal Problem:  *Constipation, chronic  Active Problems:   HTN (hypertension)   Encephalopathy   DM (diabetes mellitus)   Agitation   Discharge Condition: Improved.  Diet recommendation: Carbohydrate modified.  History of present illness:  [name] is a 61 year old female, with a PMH of HTN, type 2 DM, previous CVA, mental retardation and chronic colonic ileus s/p multiple admissions in the past, for recurrent ileus and abdominal distention, requiring rectal tube for decompression. Last hospitalization was 02/26/12-02/28/12, for severe constipation. Patient was brought in to the ED 09/17/12, by her sister because of increasing agitation/combativeness, difficulty administering medications, as well as progressive abdominal discomfort and swelling, over the past 3 days.    Hospital Course by problem:  Principal Problem:  *Constipation, chronic   Responded to soap suds enemas x 2, and rectal tube. D/C'd rectal tube 09/21/12 and the patient had a formed DM subsequent to this.  Her abdomen is now soft and non-tender/non-distended.   Continue PO Reglan, daily MiraLax at discharge to help prevent constipation. Active Problems:  HTN (hypertension)   BP under reasonable control on home medications. Encephalopathy / Agitation   Secondary to hypertension and obstipation.   Back to baseline. Given Ativan PRN. DM (diabetes mellitus)   CBGs 84-102.  Maintained on SSI Q AC and HS and Metformin.  Resume home regimen at discharge.  Procedures:  Placement of a rectal tube 09/19/12.  Consultations:  None.  Discharge Exam: Filed Vitals:   09/21/12 1436  BP: 133/72  Pulse: 87  Temp: 97.7 F (36.5  C)  Resp: 20   Filed Vitals:   09/20/12 1357 09/20/12 2239 09/21/12 0557 09/21/12 1436  BP: 156/75 125/55 118/73 133/72  Pulse: 94 82 88 87  Temp: 99.1 F (37.3 C) 97.7 F (36.5 C) 98.2 F (36.8 C) 97.7 F (36.5 C)  TempSrc: Oral Oral Oral Oral  Resp: 18 17 17 20   Height:      Weight:      SpO2: 100% 97% 100% 100%    Gen:  NAD Cardiovascular:  RRR, No M/R/G Respiratory: Lungs CTAB Gastrointestinal: Abdomen soft, NT/ND with normal active bowel sounds. Extremities: No C/E/C   Discharge Instructions  Discharge Orders    Future Orders Please Complete By Expires   Diet Carb Modified      Increase activity slowly      Call MD for:      Scheduling Instructions:   For constipation (no BM in > 48 hours)   Call MD for:  persistant nausea and vomiting      Call MD for:  severe uncontrolled pain          Medication List     As of 09/21/2012  3:06 PM    TAKE these medications         carvedilol 25 MG tablet   Commonly known as: COREG   Take 1 tablet (25 mg total) by mouth 2 (two) times daily with a meal.      insulin glargine 100 UNIT/ML injection   Commonly known as: LANTUS   Inject 10-20 Units into the skin at bedtime. 1O units if sugar is greater than 79 and 20 units if sugar is greater than 90  lisinopril 20 MG tablet   Commonly known as: PRINIVIL,ZESTRIL   Take 2 tablets (40 mg total) by mouth daily.      metFORMIN 500 MG 24 hr tablet   Commonly known as: GLUCOPHAGE-XR   Take 1 tablet (500 mg total) by mouth 2 (two) times daily with a meal.      metoCLOPramide 5 MG tablet   Commonly known as: REGLAN   Take 1 tablet (5 mg total) by mouth 4 (four) times daily -  before meals and at bedtime.      multivitamins ther. w/minerals Tabs   Take 1 tablet by mouth daily.      polyethylene glycol packet   Commonly known as: MIRALAX / GLYCOLAX   Take 17 g by mouth daily.      senna-docusate 8.6-50 MG per tablet   Commonly known as: Senokot-S   Take 1 tablet  by mouth 2 (two) times daily.      sodium phosphate 7-19 GM/118ML Enem   Place 1 enema rectally daily as needed.           Follow-up Information    Follow up with Cynthia Rily, MD. Schedule an appointment as soon as possible for a visit in 1 week.   Contact information:   [address] [place] [address] [phone]           The results of significant diagnostics from this hospitalization (including imaging, microbiology, ancillary and laboratory) are listed below for reference.    Significant Diagnostic Studies:  Dg Abd Acute W/chest 09/17/2012 IMPRESSION: 1.  Chronic decreased colonic inertia.  Stable bowel gas pattern. No definite retained stool in the sigmoid colon or rectum. 2.  No free air. 3.  Lower lung volumes with mild basilar atelectasis.   Original Report Authenticated By: Harley Hallmark, M.D.     Microbiology: No results found for this or any previous visit (from the past 240 hour(s)).   Labs: Basic Metabolic Panel:  Lab 09/18/12 9147 09/18/12 0010 09/17/12 1817  NA 141 -- 140  K 3.7 -- 3.8  CL 105 -- 103  CO2 28 -- 27  GLUCOSE 73 -- 89  BUN 11 -- 13  CREATININE 0.56 0.56 0.67  CALCIUM 9.1 -- 9.7  MG -- 2.0 --  PHOS -- 3.1 --   Liver Function Tests:  Lab 09/18/12 0336  AST 22  ALT 18  ALKPHOS 85  BILITOT 0.3  PROT 6.6  ALBUMIN 3.3*   CBC:  Lab 09/18/12 0336 09/18/12 0010 09/17/12 1817  WBC 8.9 9.9 12.4*  NEUTROABS -- -- 8.3*  HGB 12.7 11.9* 13.4  HCT 39.5 36.7 41.0  MCV 88.2 88.4 87.8  PLT 300 265 309   CBG:  Lab 09/21/12 1218 09/21/12 0733 09/20/12 2236 09/20/12 1745 09/20/12 1240  GLUCAP 88 85 96 84 102*    Time coordinating discharge: 25 minutes.  Signed:  RAMA,CHRISTINA  Pager [phone] [facility] 09/21/2012, 3:06 PM

## 2012-11-19 ENCOUNTER — Observation Stay (HOSPITAL_COMMUNITY)
Admission: EM | Admit: 2012-11-19 | Discharge: 2012-11-21 | Disposition: A | Discharge status: Home / Self Care | Payer: Medicare Other | Attending: Family Medicine | Admitting: Family Medicine

## 2012-11-19 ENCOUNTER — Encounter (HOSPITAL_COMMUNITY): Payer: Self-pay | Admitting: Emergency Medicine

## 2012-11-19 ENCOUNTER — Emergency Department (HOSPITAL_COMMUNITY): Payer: Medicare Other

## 2012-11-19 DIAGNOSIS — F79 Unspecified intellectual disabilities: Secondary | ICD-10-CM | POA: Insufficient documentation

## 2012-11-19 DIAGNOSIS — Z87440 Personal history of urinary (tract) infections: Secondary | ICD-10-CM | POA: Insufficient documentation

## 2012-11-19 DIAGNOSIS — K5909 Other constipation: Secondary | ICD-10-CM | POA: Insufficient documentation

## 2012-11-19 DIAGNOSIS — E119 Type 2 diabetes mellitus without complications: Secondary | ICD-10-CM | POA: Insufficient documentation

## 2012-11-19 DIAGNOSIS — R142 Eructation: Secondary | ICD-10-CM | POA: Insufficient documentation

## 2012-11-19 DIAGNOSIS — M1610 Unilateral primary osteoarthritis, unspecified hip: Secondary | ICD-10-CM | POA: Insufficient documentation

## 2012-11-19 DIAGNOSIS — R109 Unspecified abdominal pain: Secondary | ICD-10-CM

## 2012-11-19 DIAGNOSIS — K599 Functional intestinal disorder, unspecified: Secondary | ICD-10-CM

## 2012-11-19 DIAGNOSIS — Z794 Long term (current) use of insulin: Secondary | ICD-10-CM | POA: Insufficient documentation

## 2012-11-19 DIAGNOSIS — R141 Gas pain: Secondary | ICD-10-CM | POA: Insufficient documentation

## 2012-11-19 DIAGNOSIS — K5989 Other specified functional intestinal disorders: Principal | ICD-10-CM | POA: Insufficient documentation

## 2012-11-19 DIAGNOSIS — Z8673 Personal history of transient ischemic attack (TIA), and cerebral infarction without residual deficits: Secondary | ICD-10-CM | POA: Insufficient documentation

## 2012-11-19 DIAGNOSIS — I10 Essential (primary) hypertension: Secondary | ICD-10-CM | POA: Insufficient documentation

## 2012-11-19 DIAGNOSIS — Z79899 Other long term (current) drug therapy: Secondary | ICD-10-CM | POA: Insufficient documentation

## 2012-11-19 DIAGNOSIS — R509 Fever, unspecified: Secondary | ICD-10-CM | POA: Insufficient documentation

## 2012-11-19 DIAGNOSIS — K5900 Constipation, unspecified: Secondary | ICD-10-CM

## 2012-11-19 DIAGNOSIS — R143 Flatulence: Secondary | ICD-10-CM | POA: Insufficient documentation

## 2012-11-19 LAB — URINALYSIS, MICROSCOPIC ONLY
Glucose, UA: NEGATIVE mg/dL
Nitrite: NEGATIVE
Specific Gravity, Urine: 1.025 (ref 1.005–1.030)
pH: 6 (ref 5.0–8.0)

## 2012-11-19 LAB — COMPREHENSIVE METABOLIC PANEL
Alkaline Phosphatase: 98 U/L (ref 39–117)
BUN: 12 mg/dL (ref 6–23)
Calcium: 9.7 mg/dL (ref 8.4–10.5)
Creatinine, Ser: 0.73 mg/dL (ref 0.50–1.10)
GFR calc Af Amer: >90 mL/min (ref >90)
Glucose, Bld: 148 mg/dL — ABNORMAL HIGH (ref 70–99)
Potassium: 4.7 mEq/L (ref 3.5–5.1)
Total Protein: 8.1 g/dL (ref 6.0–8.3)

## 2012-11-19 LAB — CBC WITH DIFFERENTIAL/PLATELET
Basophils Relative: 0 % (ref 0–1)
Eosinophils Absolute: 0.1 10*3/uL (ref 0.0–0.7)
Eosinophils Relative: 1 % (ref 0–5)
Hemoglobin: 13.9 g/dL (ref 12.0–15.0)
Lymphs Abs: 2.5 10*3/uL (ref 0.7–4.0)
MCH: 28.8 pg (ref 26.0–34.0)
MCHC: 33.3 g/dL (ref 30.0–36.0)
MCV: 86.3 fL (ref 78.0–100.0)
Monocytes Absolute: 0.4 10*3/uL (ref 0.1–1.0)
Monocytes Relative: 5 % (ref 3–12)
RBC: 4.83 MIL/uL (ref 3.87–5.11)

## 2012-11-19 LAB — LIPASE, BLOOD: Lipase: 34 U/L (ref 11–59)

## 2012-11-19 MED ORDER — LACTULOSE 10 GM/15ML PO SOLN
10.0000 g | Freq: Every day | ORAL | Status: DC
  Administered 2012-11-19 – 2012-11-21 (×3): 10 g via ORAL
  Filled 2012-11-19 (×3): qty 15

## 2012-11-19 MED ORDER — ACETAMINOPHEN 650 MG RE SUPP: 650.0000 mg | Freq: Four times a day (QID) | RECTAL | Status: DC | PRN

## 2012-11-19 MED ORDER — SODIUM CHLORIDE 0.9 % IV SOLN
INTRAVENOUS | Status: DC
  Administered 2012-11-19: 21:00:00 via INTRAVENOUS

## 2012-11-19 MED ORDER — ACETAMINOPHEN 325 MG PO TABS: 650.0000 mg | ORAL_TABLET | Freq: Four times a day (QID) | ORAL | Status: DC | PRN

## 2012-11-19 MED ORDER — METOCLOPRAMIDE HCL 5 MG PO TABS
5.0000 mg | ORAL_TABLET | Freq: Three times a day (TID) | ORAL | Status: DC
  Administered 2012-11-19 – 2012-11-21 (×7): 5 mg via ORAL
  Filled 2012-11-19 (×11): qty 1

## 2012-11-19 MED ORDER — ONDANSETRON HCL 4 MG/2ML IJ SOLN
4.0000 mg | Freq: Once | INTRAMUSCULAR | Status: AC
  Administered 2012-11-19: 4 mg via INTRAVENOUS
  Filled 2012-11-19: qty 2

## 2012-11-19 MED ORDER — ONDANSETRON HCL 4 MG PO TABS: 4.0000 mg | ORAL_TABLET | Freq: Four times a day (QID) | ORAL | Status: DC | PRN

## 2012-11-19 MED ORDER — ONDANSETRON HCL 4 MG/2ML IJ SOLN: 4.0000 mg | Freq: Four times a day (QID) | INTRAMUSCULAR | Status: DC | PRN

## 2012-11-19 MED ORDER — LACTATED RINGERS IV BOLUS (SEPSIS)
1000.0000 mL | Freq: Once | INTRAVENOUS | Status: AC
  Administered 2012-11-19: 1000 mL via INTRAVENOUS

## 2012-11-19 MED ORDER — ENOXAPARIN SODIUM 40 MG/0.4ML ~~LOC~~ SOLN
40.0000 mg | SUBCUTANEOUS | Status: DC
  Administered 2012-11-19 – 2012-11-20 (×2): 40 mg via SUBCUTANEOUS
  Filled 2012-11-19 (×3): qty 0.4

## 2012-11-19 MED ORDER — KCL IN DEXTROSE-NACL 20-5-0.45 MEQ/L-%-% IV SOLN
INTRAVENOUS | Status: DC
  Filled 2012-11-19: qty 1000

## 2012-11-19 MED ORDER — LORAZEPAM 2 MG/ML IJ SOLN
1.0000 mg | Freq: Once | INTRAMUSCULAR | Status: AC
  Administered 2012-11-19: 1 mg via INTRAVENOUS
  Filled 2012-11-19: qty 1

## 2012-11-19 MED ORDER — AMITRIPTYLINE HCL 25 MG PO TABS
25.0000 mg | ORAL_TABLET | Freq: Every day | ORAL | Status: DC
  Administered 2012-11-19 – 2012-11-20 (×2): 25 mg via ORAL
  Filled 2012-11-19 (×3): qty 1

## 2012-11-19 MED ORDER — MILK AND MOLASSES ENEMA
Freq: Once | RECTAL | Status: AC
  Administered 2012-11-19: 16:00:00 via RECTAL
  Filled 2012-11-19: qty 250

## 2012-11-19 NOTE — ED Provider Notes (Signed)
History     CSN: [id number]  Arrival date & time 11/19/12  1300   First MD Initiated Contact with Patient 11/19/12 1410      Chief Complaint  Patient presents with  . Abdominal Pain    (Consider location/radiation/quality/duration/timing/severity/associated sxs/prior treatment) HPIMichelle A [name] is a 61 y.o. female with mental retardation, history of CVA and long history of recurrent constipation requiring hospitalization and rectal tube. Presents with a few days of worsening abdominal distention, abdominal pain, decreased by mouth intake and no BMs. Nothing is changed in her bowel regimen. Her caregiver is her sister and says that she have a fever of 103 this morning but denies any cough, change in urinary habits, foul smelling urine. Patient has been treated recently with penicillin for urinary tract infection per her sister.  There's been no diarrhea, no vomiting the day however patient did have some vomiting last night. Patient has had a couple days of decreased appetite and decreased by mouth intake. No new focal neurologic deficits, no complaints of dizziness or headache.  Past Medical History  Diagnosis Date  . Hypertension   . Stroke   . Diabetes mellitus   . Chronic constipation     Past Surgical History  Procedure Date  . No past surgeries     No family history on file.  History  Substance Use Topics  . Smoking status: Never Smoker   . Smokeless tobacco: Never Used  . Alcohol Use: No     Comment: occasional wine    OB History    Grav Para Term Preterm Abortions TAB SAB Ect Mult Living                  Review of Systems  Allergies  Review of patient's allergies indicates no known allergies.  Home Medications   Current Outpatient Rx  Name  Route  Sig  Dispense  Refill  . AMITRIPTYLINE HCL 25 MG PO TABS   Oral   Take 25 mg by mouth at bedtime.         Marland Kitchen CARVEDILOL 25 MG PO TABS   Oral   Take 25 mg by mouth daily.         . IRON PO   Oral   Take 1 tablet by mouth daily.         Marland Kitchen LACTULOSE 10 GM/15ML PO SOLN   Oral   Take 10 g by mouth daily.         Marland Kitchen LISINOPRIL 40 MG PO TABS   Oral   Take 40 mg by mouth daily.         Marland Kitchen METFORMIN HCL 500 MG PO TABS   Oral   Take 500 mg by mouth 2 (two) times daily with a meal.         . METOCLOPRAMIDE HCL 5 MG PO TABS   Oral   Take 1 tablet (5 mg total) by mouth 4 (four) times daily -  before meals and at bedtime.   90 tablet   2   . THERA M PLUS PO TABS   Oral   Take 1 tablet by mouth daily.           . INSULIN GLARGINE 100 UNIT/ML [place] SOLN   Subcutaneous   Inject 10-20 Units into the skin at bedtime. 1O units if sugar is greater than 79 and 20 units if sugar is greater than 90         . METOCLOPRAMIDE HCL 10 MG  PO TABS   Oral   Take 1 tablet (10 mg total) by mouth 4 (four) times daily -  before meals and at bedtime.   120 tablet   0   . METOCLOPRAMIDE HCL 10 MG PO TABS   Oral   Take 1 tablet (10 mg total) by mouth 3 (three) times daily before meals.   60 tablet   0     Take 30 mins before meals     BP 172/94  Pulse 78  Temp 97.9 F (36.6 C) (Oral)  Resp 18  SpO2 100%  Physical Exam  Nursing notes reviewed.  Electronic medical record reviewed. VITAL SIGNS:   Filed Vitals:   11/19/12 1326 11/19/12 1517 11/19/12 1637  BP: 182/96 172/94 121/74  Pulse: 112 78 80  Temp: 97.9 F (36.6 C)    TempSrc: Oral    Resp: 18 18 18   SpO2: 100% 100% 100%   CONSTITUTIONAL: Awake, oriented, appears non-toxic HENT: Atraumatic, normocephalic, oral mucosa pink and moist, airway patent. Nares patent without drainage. External ears normal. EYES: Conjunctiva clear, EOMI, PERRLA NECK: Trachea midline, non-tender, supple CARDIOVASCULAR: Tachycardic, Normal rhythm, No murmurs, rubs, gallops PULMONARY/CHEST: Clear to auscultation, no rhonchi, wheezes, or rales. Symmetrical breath sounds. Non-tender. ABDOMINAL: Distended abdomen, hypertympanic, decreased bowel  sounds, mild tenderness to palpation diffusely - no rebound tenderness or guarding. NEUROLOGIC: Moving all four extremities, mild spasticity, no new gross sensory or motor deficits. EXTREMITIES: No clubbing, cyanosis, or edema SKIN: Warm, Dry, No erythema, No rash  ED Course  Procedures (including critical care time)  Labs Reviewed  COMPREHENSIVE METABOLIC PANEL - Abnormal; Notable for the following:    Glucose, Bld 148 (*)     All other components within normal limits  URINALYSIS, MICROSCOPIC ONLY - Abnormal; Notable for the following:    Bilirubin Urine SMALL (*)     Ketones, ur TRACE (*)     Urobilinogen, UA 2.0 (*)     Leukocytes, UA MODERATE (*)     Bacteria, UA FEW (*)     Squamous Epithelial / LPF FEW (*)     All other components within normal limits  CBC WITH DIFFERENTIAL  LIPASE, BLOOD  LACTIC ACID, PLASMA  URINE CULTURE   Dg Chest 2 View  11/19/2012  *RADIOLOGY REPORT*  Clinical Data: Abdominal pain, hypertension.  CHEST - 2 VIEW  Comparison: November 30, 2009.  Findings: Cardiomediastinal silhouette appears normal.  No acute pulmonary disease is noted.  Bony thorax is intact.  IMPRESSION: No acute cardiopulmonary abnormality seen.   Original Report Authenticated By: Lupita Raider.,  M.D.    Dg Abd 2 Views  11/19/2012  *RADIOLOGY REPORT*  Clinical Data: Distended abdomen.  Chronic constipation  ABDOMEN - 2 VIEW  Comparison: 09/17/2012  Findings: Moderate colonic dilatation is present.  This is slightly improved from the prior study.  Decrease in stool in the colon compared with the prior study.  No small bowel dilatation or free air is identified.  Advanced degenerative change in the right hip joint.  IMPRESSION: Chronic dilatation of the colon, with improvement from the prior study.   Original Report Authenticated By: Janeece Riggers, M.D.      1. Constipation, chronic   2. DM (diabetes mellitus)       MDM  [name] is a 61 y.o. female with a long history of  chronic constipation requiring admission and rectal tube administration. Have checked other potential sources of fever, patient does not have pneumonia, urinalysis is not  convincing for urinary tract collection either. There are some white cells however no nitrates, no bacteria. Will culture and followup.  Patient's labs are otherwise unremarkable. Discussed with [facility] for admission. Placed a temporary orders as well as orders for rectal tube when she gets to the floor.        Jones Skene, MD 11/19/12 1733

## 2012-11-19 NOTE — ED Notes (Signed)
Pt transported to restroom via stretcher with sister and Charity fundraiser. Pt in remains in restroom at this time with family member.

## 2012-11-19 NOTE — H&P (Signed)
History and Physical  KATJA BLUE WUJ:[id number] DOB: Jan 24, 1951 DOA: 11/19/2012  Referring physician: Nichola Sizer, MD PCP: Paulino Rily, MD   Chief Complaint: abdominal pain  HPI:  61 year old woman PMH colonic inertia with multiple hospitalizations for exacerbations requiring aggressive bowel regimen and rectal tube decompression presented to ED with worsening abdominal distention, abdominal pain and was referred for admission.  History obtained from sister at bedside. Patient over last 2 weeks has begun to refuse medications and it has been challenging to get her to take them, even crushed. 2 days ago patient ate chicken and macaroni and cheese which she is not allowed to have and although had BM yesterday, it was noted that distension increased and patient developed pain and refused food. Developed cough, congestion. This AM patient had fever 103 at home and was thus brought ot ED.  Treated recently with penicillin for urinary tract infection per her sister.  In ED noted to be afebrile with stable vitals. Labs unremarkable. AXR with known colonic distension.  Chart Review:  Notable for multiple hospitalizations for chronic ileus/obstipation/inertia (03/2011, 05/2011, 07/2011, 08/2011, 10/2011, 11/2011, 02/2012, 09/2012  each time treated with bowel regimen +/- rectal tube for decompression. Last colonoscopy 03/2011. Seen by general surgery 05/2011 but not felt to be a candidate for diverting ileostomy, seen by GI 12/2011 with recommendations for decompression and bowel regimen  Review of Systems:  Obtained per sister. Negative for visual changes, sore throat, rash, new muscle aches, chest pain, SOB, dysuria, bleeding, n/v/abdominal pain.  Past Medical History  Diagnosis Date  . Hypertension   . Stroke   . Diabetes mellitus   . Chronic constipation     Past Surgical History  Procedure Date  . No past surgeries     Social History:  reports that she has never smoked. She has never  used smokeless tobacco. She reports that she does not drink alcohol or use illicit drugs.  No Known Allergies  Family History  Problem Relation Age of Onset  . Diabetes Mother      Prior to Admission medications   Medication Sig Start Date End Date Taking? Authorizing Provider  amitriptyline (ELAVIL) 25 MG tablet Take 25 mg by mouth at bedtime.   Yes Historical Provider, MD  carvedilol (COREG) 25 MG tablet Take 25 mg by mouth daily. 02/28/12 02/27/13 Yes Cl[place] Cyndie Mull, MD  IRON PO Take 1 tablet by mouth daily.   Yes Historical Provider, MD  lactulose (CHRONULAC) 10 GM/15ML solution Take 10 g by mouth daily.   Yes Historical Provider, MD  lisinopril (PRINIVIL,ZESTRIL) 40 MG tablet Take 40 mg by mouth daily.   Yes Historical Provider, MD  metFORMIN (GLUCOPHAGE) 500 MG tablet Take 500 mg by mouth 2 (two) times daily with a meal.   Yes Historical Provider, MD  metoCLOPramide (REGLAN) 5 MG tablet Take 1 tablet (5 mg total) by mouth 4 (four) times daily -  before meals and at bedtime. 09/21/12  Yes Maryruth Bun Rama, MD  Multiple Vitamins-Minerals (MULTIVITAMINS THER. W/MINERALS) TABS Take 1 tablet by mouth daily.     Yes Historical Provider, MD  insulin glargine (LANTUS) 100 UNIT/ML injection Inject 10-20 Units into the skin at bedtime. 1O units if sugar is greater than 79 and 20 units if sugar is greater than 90    Historical Provider, MD  metoCLOPramide (REGLAN) 10 MG tablet Take 1 tablet (10 mg total) by mouth 4 (four) times daily -  before meals and at bedtime. 12/17/11 12/27/11  Thayer Ohm  Sammuel Cooper, MD  metoCLOPramide (REGLAN) 10 MG tablet Take 1 tablet (10 mg total) by mouth 3 (three) times daily before meals. 02/28/12 03/09/12  Clanford Cyndie Mull, MD   Physical Exam: Filed Vitals:   11/19/12 1326 11/19/12 1517 11/19/12 1637 11/19/12 1735  BP: 182/96 172/94 121/74 97/61  Pulse: 112 78 80 80  Temp: 97.9 F (36.6 C)     TempSrc: Oral     Resp: 18 18 18 14   SpO2: 100% 100% 100% 96%    General:   Examined in ED. Appears calm and comfortable Eyes: PERRL, normal lids, irises  ENT: grossly normal hearing, lips & tongue Neck: no LAD, masses or thyromegaly Cardiovascular: RRR, no m/r/g. No LE edema. Respiratory: CTA bilaterally, no w/r/r. Normal respiratory effort. Abdomen: massively distended, nontender, no rebound, hypertympanic Skin: no rash or induration seen on limited exam Musculoskeletal: grossly normal tone BUE/BLE, moves all extremities to command Psychiatric: unable to assess mood or affect, participates in exam, follows commands but speaks very little Neurologic: grossly non-focal.  Wt Readings from Last 3 Encounters:  09/17/12 49.6 kg (109 lb 5.6 oz)  02/27/12 49.896 kg (110 lb)  12/10/11 50.168 kg (110 lb 9.6 oz)    Labs on Admission:  Basic Metabolic Panel:  Lab 11/19/12 4098  NA 137  K 4.7  CL 99  CO2 28  GLUCOSE 148*  BUN 12  CREATININE 0.73  CALCIUM 9.7  MG --  PHOS --    Liver Function Tests:  Lab 11/19/12 1328  AST 27  ALT 21  ALKPHOS 98  BILITOT 0.4  PROT 8.1  ALBUMIN 4.0    Lab 11/19/12 1328  LIPASE 34  AMYLASE --   CBC:  Lab 11/19/12 1328  WBC 8.5  NEUTROABS 5.5  HGB 13.9  HCT 41.7  MCV 86.3  PLT 335   Radiological Exams on Admission: Dg Chest 2 View  11/19/2012  *RADIOLOGY REPORT*  Clinical Data: Abdominal pain, hypertension.  CHEST - 2 VIEW  Comparison: November 30, 2009.  Findings: Cardiomediastinal silhouette appears normal.  No acute pulmonary disease is noted.  Bony thorax is intact.  IMPRESSION: No acute cardiopulmonary abnormality seen.   Original Report Authenticated By: Lupita Raider.,  M.D.    Dg Abd 2 Views  11/19/2012  *RADIOLOGY REPORT*  Clinical Data: Distended abdomen.  Chronic constipation  ABDOMEN - 2 VIEW  Comparison: 09/17/2012  Findings: Moderate colonic dilatation is present.  This is slightly improved from the prior study.  Decrease in stool in the colon compared with the prior study.  No small bowel  dilatation or free air is identified.  Advanced degenerative change in the right hip joint.  IMPRESSION: Chronic dilatation of the colon, with improvement from the prior study.   Original Report Authenticated By: Janeece Riggers, M.D.     Principal Problem:  *Colonic inertia Active Problems:  DM type 2 (diabetes mellitus, type 2)  Abdominal  pain, other specified site  Mental retardation   Assessment/Plan 1. Colonic inertia, abdominal distension/pain--trial enemas, rectal tube as these modalities have been effective on multiple previous admissions. Non-tender abdomen, no evidence to suggest acute surgical process. Continue Reglan. 2. Recent UTI--follow-up culture, U/A equivocal. 3. Reported fever at home--monitor, not documented in ED. Etiology and significance unclear, history suggests viral URI. 4. DM type 2--appears stable. Hold metformin while inpatient. SSI. Continue Lantus as needed. 5. Mental retardation--at baseline speaks to no one but her sister. 6. History of CVA  Code Status: Full code confirmed with  sister Family Communication: discussed with sister at bedside "I've been through this so many times. Disposition Plan/Anticipated LOS: 1-2 days  Time spent: 65 minutes  [name], MD  [facility] Team 6 Pager [phone] If 8PM-8AM, please contact night-coverage at [email or web address], password [facility] 11/19/2012, 6:24 PM

## 2012-11-19 NOTE — ED Notes (Signed)
Pt presenting to ed with c/o abdominal pain with nausea and vomiting last night. Per sister who's the caretaker pt has also had decreased appetite. Per sister pt's colon doesn't work and she can't get a colostomy bag. Per sister pt has to come into hospital ever so often to get cleansed out.

## 2012-11-19 NOTE — ED Notes (Signed)
Pt transported back to room 1 with sister and RN via stretcher.

## 2012-11-20 ENCOUNTER — Observation Stay (HOSPITAL_COMMUNITY): Payer: Medicare Other

## 2012-11-20 DIAGNOSIS — K5900 Constipation, unspecified: Secondary | ICD-10-CM

## 2012-11-20 DIAGNOSIS — E119 Type 2 diabetes mellitus without complications: Secondary | ICD-10-CM

## 2012-11-20 DIAGNOSIS — R109 Unspecified abdominal pain: Secondary | ICD-10-CM

## 2012-11-20 LAB — CBC
MCH: 28.9 pg (ref 26.0–34.0)
MCHC: 33 g/dL (ref 30.0–36.0)
MCV: 87.8 fL (ref 78.0–100.0)
Platelets: 266 10*3/uL (ref 150–400)
RBC: 4.01 MIL/uL (ref 3.87–5.11)

## 2012-11-20 LAB — BASIC METABOLIC PANEL
BUN: 8 mg/dL (ref 6–23)
CO2: 24 mEq/L (ref 19–32)
Calcium: 8.8 mg/dL (ref 8.4–10.5)
GFR calc non Af Amer: >90 mL/min (ref >90)
Glucose, Bld: 77 mg/dL (ref 70–99)

## 2012-11-20 MED ORDER — POLYETHYLENE GLYCOL 3350 17 G PO PACK
17.0000 g | PACK | Freq: Every day | ORAL | Status: DC
  Administered 2012-11-20 – 2012-11-21 (×2): 17 g via ORAL
  Filled 2012-11-20 (×2): qty 1

## 2012-11-20 MED ORDER — LORAZEPAM 0.5 MG PO TABS
0.5000 mg | ORAL_TABLET | Freq: Once | ORAL | Status: AC
  Administered 2012-11-20: 0.5 mg via ORAL
  Filled 2012-11-20: qty 1

## 2012-11-20 NOTE — Progress Notes (Signed)
[facility] PROGRESS NOTE  [name]:[id number] DOB: 1951-03-12 DOA: 11/19/2012 PCP: Paulino Rily, MD  Assessment/Plan: 1. Colonic inertia, abdominal distension/pain--much improved clinically though dilatation persists (and is chronic). Continue rectal tube, bowel regimen. Continue Reglan. Constipation appears resolved by xray. 2. Recent UTI--follow-up culture, U/A equivocal. Monitor for now off abx. 3. Reported fever at home--no recurrence. Monitor. Etiology and significance unclear, history suggests viral URI. 4. DM type 2--stable. Hold metformin while inpatient. SSI. Continue Lantus as needed. 5. Mental retardation--at baseline speaks to no one but her sister. 6. History of CVA  Advance diet (not on any restrictions at home per sister)  Code Status: Full code  Family Communication: discussed with sister at bedside 12/12, very happy with progress Disposition Plan: home 12/13 if improved  [name], MD  [facility] Team 6 Pager [phone] If 8PM-8AM, please contact night-coverage at [email or web address], password [facility] 11/20/2012, 10:35 AM  LOS: 1 day   Brief narrative: 61 year old woman PMH colonic inertia with multiple hospitalizations for exacerbations requiring aggressive bowel regimen and rectal tube decompression presented to ED with worsening abdominal distention, abdominal pain and was referred for admission.  Consultants:  None  Procedures:  None  HPI/Subjective: Better per sister. Patient denies pain. Per RN large BM last night, lots of gas through rectal tube.   Objective: Filed Vitals:   11/19/12 1855 11/19/12 2246 11/19/12 2304 11/20/12 0648  BP: 95/61 103/61  120/65  Pulse:  83  78  Temp:  97.2 F (36.2 C)  97.4 F (36.3 C)  TempSrc:  Axillary  Oral  Resp:  12  16  Height:   4\' 9"  (1.448 m)   Weight:   47.4 kg (104 lb 8 oz)   SpO2:  99%  100%   No intake or output data in the 24 hours ending 11/20/12 1035 Filed Weights    11/19/12 2304  Weight: 47.4 kg (104 lb 8 oz)    Exam:  General:  Appears calm and comfortable, sitting up, alert, coloring pictures. Cardiovascular: RRR, no m/r/g.  Respiratory: CTA bilaterally, no w/r/r. Normal respiratory effort. Abdomen: soft, distended without change, non-tender, positive bowel sounds Psychiatric: very alert today, cannot assess mood/affect secondary to mental retardation  Data Reviewed: Basic Metabolic Panel:  Lab 11/20/12 8295 11/19/12 1328  NA 139 137  K 4.2 4.7  CL 106 99  CO2 24 28  GLUCOSE 77 148*  BUN 8 12  CREATININE 0.67 0.73  CALCIUM 8.8 9.7  MG -- --  PHOS -- --   Liver Function Tests:  Lab 11/19/12 1328  AST 27  ALT 21  ALKPHOS 98  BILITOT 0.4  PROT 8.1  ALBUMIN 4.0    Lab 11/19/12 1328  LIPASE 34  AMYLASE --   CBC:  Lab 11/20/12 0525 11/19/12 1328  WBC 8.9 8.5  NEUTROABS -- 5.5  HGB 11.6* 13.9  HCT 35.2* 41.7  MCV 87.8 86.3  PLT 266 335   Studies: Dg Chest 2 View  11/19/2012  *RADIOLOGY REPORT*  Clinical Data: Abdominal pain, hypertension.  CHEST - 2 VIEW  Comparison: November 30, 2009.  Findings: Cardiomediastinal silhouette appears normal.  No acute pulmonary disease is noted.  Bony thorax is intact.  IMPRESSION: No acute cardiopulmonary abnormality seen.   Original Report Authenticated By: Lupita Raider.,  M.D.    Dg Abd 2 Views  11/20/2012  *RADIOLOGY REPORT*  Clinical Data: Colonic distention, mentally challenged patient, follow-up  ABDOMEN - 2 VIEW  Comparison: Abdomen films of 11/19/2012  Findings: There is little change in chronic gaseous distention of the entire colon.  However the quantity of feces within the rectosigmoid region appears to have diminished considerably.  No small bowel distention is evident.  No free air is seen on the left lateral decubitus of the abdomen.  Marked degenerative change is noted in the right hip with lesser degenerative change of the left hip.  IMPRESSION: Less fecal material within  the rectosigmoid colon.  No significant change in gaseous distention of the entire colon.  No free air.   Original Report Authenticated By: Dwyane Dee, M.D.    Dg Abd 2 Views  11/19/2012  *RADIOLOGY REPORT*  Clinical Data: Distended abdomen.  Chronic constipation  ABDOMEN - 2 VIEW  Comparison: 09/17/2012  Findings: Moderate colonic dilatation is present.  This is slightly improved from the prior study.  Decrease in stool in the colon compared with the prior study.  No small bowel dilatation or free air is identified.  Advanced degenerative change in the right hip joint.  IMPRESSION: Chronic dilatation of the colon, with improvement from the prior study.   Original Report Authenticated By: Janeece Riggers, M.D.     Scheduled Meds:   . amitriptyline  25 mg Oral QHS  . enoxaparin (LOVENOX) injection  40 mg Subcutaneous Q24H  . [COMPLETED] lactated ringers  1,000 mL Intravenous Once  . lactulose  10 g Oral Daily  . [COMPLETED] LORazepam  1 mg Intravenous Once  . metoCLOPramide  5 mg Oral TID AC & HS  . [COMPLETED] milk and molasses   Rectal Once  . [COMPLETED] ondansetron  4 mg Intravenous Once   Continuous Infusions:   . sodium chloride 125 mL/hr at 11/19/12 2050  . [DISCONTINUED] dextrose 5 % and 0.45 % NaCl with KCl 20 mEq/L Stopped (11/19/12 2054)    Principal Problem:  *Colonic inertia Active Problems:  DM type 2 (diabetes mellitus, type 2)  Abdominal  pain, other specified site  Mental retardation  Constipation     [name], MD  [facility] Team 6 Pager [phone] If 8PM-8AM, please contact night-coverage at [email or web address], password [facility] 11/20/2012, 10:35 AM  LOS: 1 day   Time spent: 20 minutes

## 2012-11-21 LAB — URINE CULTURE: Culture: NO GROWTH

## 2012-11-21 MED ORDER — HYDROCODONE-ACETAMINOPHEN 5-325 MG PO TABS: 1.0000 | ORAL_TABLET | Freq: Four times a day (QID) | ORAL | Status: DC | PRN

## 2012-11-21 MED ORDER — POLYETHYLENE GLYCOL 3350 17 G PO PACK: 17.0000 g | PACK | Freq: Every day | ORAL | Status: AC

## 2012-11-21 NOTE — Discharge Summary (Signed)
Physician Discharge Summary  [name]:[id number] DOB: 19-Sep-1951 DOA: 11/19/2012  PCP: Cynthia Rily, MD  Admit date: 11/19/2012 Discharge date: 11/21/2012  Recommendations for Outpatient Follow-up:  1. Follow-up colonic inertia, bowel regimen 2. Follow-up DM, patient has not required insulin by report in over a month and none as inpatient. 3. Follow-up hip arthritis  Follow-up Information    Follow up with Cynthia Rily, MD. In 1 week.   Contact information:   [address] [place] [address] [phone]         Discharge Diagnoses:  1. Colonic inertia, abdominal distension/pain 2. Recent UTI 3. Reported fever at home 4. DM type 2 5. Mental retardation  Discharge Condition: improved Disposition: home with sister  Diet recommendation: regular  Filed Weights   11/19/12 2304  Weight: 47.4 kg (104 lb 8 oz)    History of present illness:  61 year old woman PMH colonic inertia with multiple hospitalizations for exacerbations requiring aggressive bowel regimen and rectal tube decompression presented to ED with worsening abdominal distention, abdominal pain and was referred for admission.  Hospital Course:  Ms. [name] for admitted for abdominal pain, constipation. Her condition rapidly improved with decompression of colon with rectal tube and enema. Subsequent xray revealed decreased stool. Colonic dilitation is chronic. Patient returned to baseline, ate well and pain resolved. Enema prior to discharge with good results. Sister feels that patient is at baseline and ready to go home.  1. Colonic inertia, abdominal distension/pain--acute discomfort and constipation resolved. Dilatation persists (and is chronic). Continue bowel regimen. Has been seen by surgery in the past and not felt to be a candidate for surgery. 2. Recent UTI--culture negative.  3. Reported fever at home--no recurrence. Etiology and significance unclear, history suggests viral URI.  4. DM  type 2--stable. Held metformin while inpatient. SSI.  5. Mental retardation--at baseline speaks to no one but her sister.  6. History of CVA  7. Hip arthritis--Lortab as needed (this can cause constipation, but sister notes it has helped with OA in past and allows patient to ambulate in the evenings and assists with BM).  Consultants:  None  Procedures:  None  Discharge Instructions  Discharge Orders    Future Orders Please Complete By Expires   Diet general      Discharge instructions      Comments:   Continue bowel regimen as you have been at home. Call physician or seek immediate medical attention for nausea, vomiting, increased pain, constipation.   Activity as tolerated - No restrictions          Medication List     As of 11/21/2012  3:43 PM    STOP taking these medications         insulin glargine 100 UNIT/ML injection   Commonly known as: LANTUS      TAKE these medications         amitriptyline 25 MG tablet   Commonly known as: ELAVIL   Take 25 mg by mouth at bedtime.      carvedilol 25 MG tablet   Commonly known as: COREG   Take 25 mg by mouth daily.      HYDROcodone-acetaminophen 5-325 MG per tablet   Commonly known as: NORCO/VICODIN   Take 1 tablet by mouth every 6 (six) hours as needed.      IRON PO   Take 1 tablet by mouth daily.      lactulose 10 GM/15ML solution   Commonly known as: CHRONULAC   Take 10  g by mouth daily.      lisinopril 40 MG tablet   Commonly known as: PRINIVIL,ZESTRIL   Take 40 mg by mouth daily.      metFORMIN 500 MG tablet   Commonly known as: GLUCOPHAGE   Take 500 mg by mouth 2 (two) times daily with a meal.      metoCLOPramide 5 MG tablet   Commonly known as: REGLAN   Take 1 tablet (5 mg total) by mouth 4 (four) times daily -  before meals and at bedtime.      multivitamins ther. w/minerals Tabs   Take 1 tablet by mouth daily.      polyethylene glycol packet   Commonly known as: MIRALAX / GLYCOLAX   Take 17 g  by mouth daily.        The results of significant diagnostics from this hospitalization (including imaging, microbiology, ancillary and laboratory) are listed below for reference.    Significant Diagnostic Studies: Dg Chest 2 View  11/19/2012  *RADIOLOGY REPORT*  Clinical Data: Abdominal pain, hypertension.  CHEST - 2 VIEW  Comparison: November 30, 2009.  Findings: Cardiomediastinal silhouette appears normal.  No acute pulmonary disease is noted.  Bony thorax is intact.  IMPRESSION: No acute cardiopulmonary abnormality seen.   Original Report Authenticated By: Lupita Raider.,  M.D.    Dg Abd 2 Views  11/20/2012  *RADIOLOGY REPORT*  Clinical Data: Colonic distention, mentally challenged patient, follow-up  ABDOMEN - 2 VIEW  Comparison: Abdomen films of 11/19/2012  Findings: There is little change in chronic gaseous distention of the entire colon.  However the quantity of feces within the rectosigmoid region appears to have diminished considerably.  No small bowel distention is evident.  No free air is seen on the left lateral decubitus of the abdomen.  Marked degenerative change is noted in the right hip with lesser degenerative change of the left hip.  IMPRESSION: Less fecal material within the rectosigmoid colon.  No significant change in gaseous distention of the entire colon.  No free air.   Original Report Authenticated By: Dwyane Dee, M.D.    Dg Abd 2 Views  11/19/2012  *RADIOLOGY REPORT*  Clinical Data: Distended abdomen.  Chronic constipation  ABDOMEN - 2 VIEW  Comparison: 09/17/2012  Findings: Moderate colonic dilatation is present.  This is slightly improved from the prior study.  Decrease in stool in the colon compared with the prior study.  No small bowel dilatation or free air is identified.  Advanced degenerative change in the right hip joint.  IMPRESSION: Chronic dilatation of the colon, with improvement from the prior study.   Original Report Authenticated By: Janeece Riggers, M.D.      Microbiology: Recent Results (from the past 240 hour(s))  URINE CULTURE     Status: Normal   Collection Time   11/19/12  1:41 PM      Component Value Range Status Comment   Specimen Description URINE, CLEAN CATCH   Final    Special Requests NONE   Final    Culture  Setup Time 11/20/2012 01:28   Final    Colony Count NO GROWTH   Final    Culture NO GROWTH   Final    Report Status 11/21/2012 FINAL   Final      Labs: Basic Metabolic Panel:  Lab 11/20/12 1610 11/19/12 1328  NA 139 137  K 4.2 4.7  CL 106 99  CO2 24 28  GLUCOSE 77 148*  BUN 8 12  CREATININE  0.67 0.73  CALCIUM 8.8 9.7  MG -- --  PHOS -- --   Liver Function Tests:  Lab 11/19/12 1328  AST 27  ALT 21  ALKPHOS 98  BILITOT 0.4  PROT 8.1  ALBUMIN 4.0    Lab 11/19/12 1328  LIPASE 34  AMYLASE --   CBC:  Lab 11/20/12 0525 11/19/12 1328  WBC 8.9 8.5  NEUTROABS -- 5.5  HGB 11.6* 13.9  HCT 35.2* 41.7  MCV 87.8 86.3  PLT 266 335    Principal Problem:  *Colonic inertia Active Problems:  DM type 2 (diabetes mellitus, type 2)  Abdominal  pain, other specified site  Mental retardation  Constipation   Time coordinating discharge: 25 minutes  Signed:  [name], MD [facility] 11/21/2012, 1:58 PM

## 2012-11-21 NOTE — Progress Notes (Addendum)
[facility] PROGRESS NOTE  TATANISHA CUTHBERT AVW:[id number] DOB: 1951/09/20 DOA: 11/19/2012 PCP: Paulino Rily, MD  Assessment/Plan: 1. Colonic inertia, abdominal distension/pain--acute discomfort and constipation resolved. Dilatation persists (and is chronic). Remove rectal tube, repeat enema x1. Continue Reglan. 2. Recent UTI--culture negative. 3. Reported fever at home--no recurrence. Etiology and significance unclear, history suggests viral URI. 4. DM type 2--stable. Hold metformin while inpatient. SSI. 5. Mental retardation--at baseline speaks to no one but her sister. 6. History of CVA 7. Hip arthritis--Lortab as needed (discussed that this can cause constipation, but sister notes it has helped with OA in past and allows patient to ambulate in the evenings and assists with BM).  Discussed with sister at bedside--feels she is much improved and ready to go home later today, requests enema prior to discharge.  Code Status: Full code  Family Communication: as above Disposition Plan: home with sister  [name], MD  [facility] Team 6 Pager [phone] If 8PM-8AM, please contact night-coverage at [email or web address], password [facility] 11/21/2012, 12:58 PM  LOS: 2 days   Brief narrative: 61 year old woman PMH colonic inertia with multiple hospitalizations for exacerbations requiring aggressive bowel regimen and rectal tube decompression presented to ED with worsening abdominal distention, abdominal pain and was referred for admission.  Consultants:  None  Procedures:  None  HPI/Subjective: Sister at bedside--patient eating, no abdominal pain, no complaints. Feels like patient is doing very well. Would like to d/c rectal tube, repeat enema and go home later today. Abdomen is chronically distended, is at baseline and soft.  Objective: Filed Vitals:   11/19/12 2304 11/20/12 0648 11/20/12 2251 11/21/12 0600  BP:  120/65 149/84 172/99  Pulse:  78 74 78  Temp:  97.4 F  (36.3 C) 98.5 F (36.9 C) 97.3 F (36.3 C)  TempSrc:  Oral Oral Oral  Resp:  16 16 16   Height: 4\' 9"  (1.448 m)     Weight: 47.4 kg (104 lb 8 oz)     SpO2:  100% 100% 99%    Intake/Output Summary (Last 24 hours) at 11/21/12 1258 Last data filed at 11/21/12 1235  Gross per 24 hour  Intake    360 ml  Output    200 ml  Net    160 ml   Filed Weights   11/19/12 2304  Weight: 47.4 kg (104 lb 8 oz)    Exam:  General:  Appears calm and comfortable, sitting up, alert Cardiovascular: RRR, no m/r/g.  Respiratory: CTA bilaterally, no w/r/r. Normal respiratory effort. Abdomen: soft, distended without change, non-tender, positive bowel sounds Psychiatric: very alert today, cannot assess mood/affect secondary to mental retardation  Exam current 12/13  Data Reviewed: Basic Metabolic Panel:  Lab 11/20/12 3086 11/19/12 1328  NA 139 137  K 4.2 4.7  CL 106 99  CO2 24 28  GLUCOSE 77 148*  BUN 8 12  CREATININE 0.67 0.73  CALCIUM 8.8 9.7  MG -- --  PHOS -- --   Liver Function Tests:  Lab 11/19/12 1328  AST 27  ALT 21  ALKPHOS 98  BILITOT 0.4  PROT 8.1  ALBUMIN 4.0    Lab 11/19/12 1328  LIPASE 34  AMYLASE --   CBC:  Lab 11/20/12 0525 11/19/12 1328  WBC 8.9 8.5  NEUTROABS -- 5.5  HGB 11.6* 13.9  HCT 35.2* 41.7  MCV 87.8 86.3  PLT 266 335   Studies: Dg Chest 2 View  11/19/2012  *RADIOLOGY REPORT*  Clinical Data: Abdominal pain, hypertension.  CHEST - 2  VIEW  Comparison: November 30, 2009.  Findings: Cardiomediastinal silhouette appears normal.  No acute pulmonary disease is noted.  Bony thorax is intact.  IMPRESSION: No acute cardiopulmonary abnormality seen.   Original Report Authenticated By: Lupita Raider.,  M.D.    Dg Abd 2 Views  11/20/2012  *RADIOLOGY REPORT*  Clinical Data: Colonic distention, mentally challenged patient, follow-up  ABDOMEN - 2 VIEW  Comparison: Abdomen films of 11/19/2012  Findings: There is little change in chronic gaseous distention of  the entire colon.  However the quantity of feces within the rectosigmoid region appears to have diminished considerably.  No small bowel distention is evident.  No free air is seen on the left lateral decubitus of the abdomen.  Marked degenerative change is noted in the right hip with lesser degenerative change of the left hip.  IMPRESSION: Less fecal material within the rectosigmoid colon.  No significant change in gaseous distention of the entire colon.  No free air.   Original Report Authenticated By: Dwyane Dee, M.D.    Dg Abd 2 Views  11/19/2012  *RADIOLOGY REPORT*  Clinical Data: Distended abdomen.  Chronic constipation  ABDOMEN - 2 VIEW  Comparison: 09/17/2012  Findings: Moderate colonic dilatation is present.  This is slightly improved from the prior study.  Decrease in stool in the colon compared with the prior study.  No small bowel dilatation or free air is identified.  Advanced degenerative change in the right hip joint.  IMPRESSION: Chronic dilatation of the colon, with improvement from the prior study.   Original Report Authenticated By: Janeece Riggers, M.D.     Scheduled Meds:    . amitriptyline  25 mg Oral QHS  . enoxaparin (LOVENOX) injection  40 mg Subcutaneous Q24H  . lactulose  10 g Oral Daily  . metoCLOPramide  5 mg Oral TID AC & HS  . polyethylene glycol  17 g Oral Daily   Continuous Infusions:   Principal Problem:  *Colonic inertia Active Problems:  DM type 2 (diabetes mellitus, type 2)  Abdominal  pain, other specified site  Mental retardation  Constipation     [name], MD  [facility] Team 6 Pager [phone] If 8PM-8AM, please contact night-coverage at [email or web address], password [facility] 11/21/2012, 12:58 PM  LOS: 2 days

## 2012-11-21 NOTE — Progress Notes (Signed)
Rectal tube removed and soap suds enema given as ordered.

## 2012-11-21 NOTE — Progress Notes (Signed)
Pt's sister request ativan for pt stating that the patient is restless.  MD on call contacted, orders placed and carried out.  Pt in bed alert at this time, watching sister closely.  Will continue to monitor.

## 2012-11-21 NOTE — Progress Notes (Signed)
Good results noted from enema, large amt stool noted.  Discharge instructions reviewed with pt her sister voices understanding.

## 2012-12-23 ENCOUNTER — Encounter (HOSPITAL_COMMUNITY): Payer: Self-pay | Admitting: Emergency Medicine

## 2012-12-23 ENCOUNTER — Emergency Department (HOSPITAL_COMMUNITY): Payer: Medicare Other

## 2012-12-23 ENCOUNTER — Emergency Department (HOSPITAL_COMMUNITY)
Admission: EM | Admit: 2012-12-23 | Discharge: 2012-12-24 | Disposition: A | Discharge status: Home / Self Care | Payer: Medicare Other | Attending: Emergency Medicine | Admitting: Emergency Medicine

## 2012-12-23 DIAGNOSIS — K5900 Constipation, unspecified: Secondary | ICD-10-CM | POA: Insufficient documentation

## 2012-12-23 DIAGNOSIS — Z79899 Other long term (current) drug therapy: Secondary | ICD-10-CM | POA: Insufficient documentation

## 2012-12-23 DIAGNOSIS — I10 Essential (primary) hypertension: Secondary | ICD-10-CM | POA: Insufficient documentation

## 2012-12-23 DIAGNOSIS — Z8673 Personal history of transient ischemic attack (TIA), and cerebral infarction without residual deficits: Secondary | ICD-10-CM | POA: Insufficient documentation

## 2012-12-23 DIAGNOSIS — E119 Type 2 diabetes mellitus without complications: Secondary | ICD-10-CM | POA: Insufficient documentation

## 2012-12-23 DIAGNOSIS — K3189 Other diseases of stomach and duodenum: Secondary | ICD-10-CM | POA: Insufficient documentation

## 2012-12-23 LAB — CBC WITH DIFFERENTIAL/PLATELET
Basophils Absolute: 0 10*3/uL (ref 0.0–0.1)
Basophils Relative: 0 % (ref 0–1)
Eosinophils Absolute: 0 10*3/uL (ref 0.0–0.7)
HCT: 38.9 % (ref 36.0–46.0)
Hemoglobin: 13.1 g/dL (ref 12.0–15.0)
MCH: 29.6 pg (ref 26.0–34.0)
MCHC: 33.7 g/dL (ref 30.0–36.0)
Monocytes Absolute: 0.7 10*3/uL (ref 0.1–1.0)
Monocytes Relative: 7 % (ref 3–12)
RDW: 14.2 % (ref 11.5–15.5)

## 2012-12-23 LAB — COMPREHENSIVE METABOLIC PANEL
AST: 45 U/L — ABNORMAL HIGH (ref 0–37)
Albumin: 3.8 g/dL (ref 3.5–5.2)
BUN: 10 mg/dL (ref 6–23)
Calcium: 9.7 mg/dL (ref 8.4–10.5)
Creatinine, Ser: 0.53 mg/dL (ref 0.50–1.10)
Total Bilirubin: 0.5 mg/dL (ref 0.3–1.2)
Total Protein: 7.7 g/dL (ref 6.0–8.3)

## 2012-12-23 LAB — URINALYSIS, ROUTINE W REFLEX MICROSCOPIC
Glucose, UA: NEGATIVE mg/dL
Ketones, ur: 15 mg/dL — AB
Nitrite: NEGATIVE
Protein, ur: 100 mg/dL — AB
pH: 5.5 (ref 5.0–8.0)

## 2012-12-23 LAB — URINE MICROSCOPIC-ADD ON

## 2012-12-23 LAB — LIPASE, BLOOD: Lipase: 24 U/L (ref 11–59)

## 2012-12-23 MED ORDER — METFORMIN HCL 500 MG PO TABS
500.0000 mg | ORAL_TABLET | Freq: Two times a day (BID) | ORAL | Status: DC
  Administered 2012-12-23: 500 mg via ORAL
  Filled 2012-12-23 (×2): qty 1

## 2012-12-23 MED ORDER — LISINOPRIL 40 MG PO TABS
40.0000 mg | ORAL_TABLET | Freq: Every day | ORAL | Status: DC
  Administered 2012-12-23: 40 mg via ORAL
  Filled 2012-12-23: qty 1

## 2012-12-23 MED ORDER — METOCLOPRAMIDE HCL 10 MG PO TABS
5.0000 mg | ORAL_TABLET | Freq: Three times a day (TID) | ORAL | Status: DC
  Administered 2012-12-23: 5 mg via ORAL
  Filled 2012-12-23: qty 1

## 2012-12-23 MED ORDER — CARVEDILOL 25 MG PO TABS
25.0000 mg | ORAL_TABLET | Freq: Every day | ORAL | Status: DC
  Administered 2012-12-23: 25 mg via ORAL
  Filled 2012-12-23: qty 1

## 2012-12-23 MED ORDER — LACTULOSE 10 GM/15ML PO SOLN
10.0000 g | Freq: Every day | ORAL | Status: DC
  Administered 2012-12-23: 10 g via ORAL
  Filled 2012-12-23: qty 15

## 2012-12-23 MED ORDER — POLYETHYLENE GLYCOL 3350 17 G PO PACK
17.0000 g | PACK | Freq: Every day | ORAL | Status: DC
Start: 2012-12-24 — End: 2012-12-24
  Administered 2012-12-23: 17 g via ORAL
  Filled 2012-12-23: qty 1

## 2012-12-23 NOTE — ED Provider Notes (Signed)
History     CSN: [id number]  Arrival date & time 12/23/12  1946   First MD Initiated Contact with Patient 12/23/12 2243      Chief Complaint  Patient presents with  . Abdominal Pain  . Constipation    (Consider location/radiation/quality/duration/timing/severity/associated sxs/prior treatment) HPI Comments: [name] is a 62 y.o. Female who is here for evaluation of constipation, abdominal pain, and not taking her medications. The problem has been present for 3 days. It is, recurrent. She has had frequent admissions for this problem. Her sister is her caregiver, and feels like the patient needs to be admitted for a day or 2. She has not seen her PCP, since last month. There is no reported fever, or vomiting. The sister has tried giving her a soapsuds enema and a fleets enema, without relief. On her last admission, she was diagnosed with colon inertia. She is on a chronic bowel program.   Level V caveat- mental retardation  Patient is a 62 y.o. female presenting with abdominal pain and constipation. The history is provided by the patient.  Abdominal Pain The primary symptoms of the illness include abdominal pain.  Additional symptoms associated with the illness include constipation.  Constipation  Associated symptoms include abdominal pain.    Past Medical History  Diagnosis Date  . Hypertension   . Stroke   . Diabetes mellitus   . Chronic constipation     Past Surgical History  Procedure Date  . No past surgeries     Family History  Problem Relation Age of Onset  . Diabetes Mother     History  Substance Use Topics  . Smoking status: Never Smoker   . Smokeless tobacco: Never Used  . Alcohol Use: No     Comment: occasional wine    OB History    Grav Para Term Preterm Abortions TAB SAB Ect Mult Living                  Review of Systems  Unable to perform ROS Gastrointestinal: Positive for abdominal pain and constipation.    Allergies  Review of  patient's allergies indicates no known allergies.  Home Medications   Current Outpatient Rx  Name  Route  Sig  Dispense  Refill  . AMITRIPTYLINE HCL 25 MG PO TABS   Oral   Take 25 mg by mouth at bedtime.         Marland Kitchen CARVEDILOL 25 MG PO TABS   Oral   Take 25 mg by mouth daily.         Marland Kitchen HYDROCODONE-ACETAMINOPHEN 5-325 MG PO TABS   Oral   Take 1 tablet by mouth every 6 (six) hours as needed.   10 tablet      . IRON PO   Oral   Take 1 tablet by mouth daily.         Marland Kitchen LACTULOSE 10 GM/15ML PO SOLN   Oral   Take 10 g by mouth daily.         Marland Kitchen LISINOPRIL 40 MG PO TABS   Oral   Take 40 mg by mouth daily.         Marland Kitchen METFORMIN HCL 500 MG PO TABS   Oral   Take 500 mg by mouth 2 (two) times daily with a meal.         . METOCLOPRAMIDE HCL 5 MG PO TABS   Oral   Take 1 tablet (5 mg total) by mouth 4 (four) times  daily -  before meals and at bedtime.   90 tablet   2   . THERA M PLUS PO TABS   Oral   Take 1 tablet by mouth daily.           Marland Kitchen POLYETHYLENE GLYCOL 3350 PO PACK   Oral   Take 17 g by mouth daily.           BP 175/125  Pulse 93  Temp 97 F (36.1 C) (Oral)  Resp 17  SpO2 98%  Physical Exam  Nursing note and vitals reviewed. Constitutional: She is oriented to person, place, and time. She appears well-developed.       Appears older than stated age, frail  HENT:  Head: Normocephalic and atraumatic.  Eyes: Conjunctivae normal and EOM are normal. Pupils are equal, round, and reactive to light.  Neck: Normal range of motion and phonation normal. Neck supple.  Cardiovascular: Normal rate, regular rhythm and intact distal pulses.   Pulmonary/Chest: Effort normal and breath sounds normal. She exhibits no tenderness.  Abdominal: Soft. She exhibits distension. There is tenderness (Mild, diffuse). There is no guarding.  Genitourinary:       Anal exam-somewhat tight sphincter tone. No mass or fecal impaction. Brown stool in the rectal vault    Musculoskeletal: Normal range of motion.  Neurological: She is alert and oriented to person, place, and time. She has normal strength. She exhibits normal muscle tone.  Skin: Skin is warm and dry.  Psychiatric:       Anxious. She does not make eye contact. She speaks normally to her sister, but will not speak to the examiner    ED Course  Procedures (including critical care time)\  Initial blood pressure 225/118, it spontaneously improved to 151/95, at 22:50. Enema ordered. Regular daily medications ordered- the nurse, was able to convince the patient to take the medicine.  Patient's sister refused the enema.  Labs Reviewed  URINALYSIS, ROUTINE W REFLEX MICROSCOPIC - Abnormal; Notable for the following:    Color, Urine AMBER (*)  BIOCHEMICALS MAY BE AFFECTED BY COLOR   APPearance CLOUDY (*)     Bilirubin Urine SMALL (*)     Ketones, ur 15 (*)     Protein, ur 100 (*)     Leukocytes, UA SMALL (*)     All other components within normal limits  COMPREHENSIVE METABOLIC PANEL - Abnormal; Notable for the following:    Sodium 134 (*)     Glucose, Bld 108 (*)     AST 45 (*)     All other components within normal limits  URINE MICROSCOPIC-ADD ON - Abnormal; Notable for the following:    Crystals CA OXALATE CRYSTALS (*)     All other components within normal limits  COMPREHENSIVE METABOLIC PANEL - Abnormal; Notable for the following:    Potassium 3.3 (*)     All other components within normal limits  CBC WITH DIFFERENTIAL  LIPASE, BLOOD  OCCULT BLOOD, POC DEVICE  LAB REPORT - SCANNED   Dg Abd Acute W/chest  12/23/2012  *RADIOLOGY REPORT*  Clinical Data: Abdominal pain and distension.  ACUTE ABDOMEN SERIES (ABDOMEN 2 VIEW & CHEST 1 VIEW)  Comparison: 11/20/2012  Findings: There is prominent diffuse gaseous distension throughout the colon with stool filling a less distended right colon and transverse colon.  Gas moves to the right colon on decubitus view. Changes are most likely to  represent colonic ileus.  Degree of distension is stable since previous study. No  free air in the abdomen.  No abnormal air fluid levels.  Low lung volumes due to the distended abdomen.  The heart size and pulmonary vascularity are normal for technique.  No focal airspace consolidation is noted in the lungs.  IMPRESSION: Diffuse gaseous distension of the colon, likely due to colonic ileus.  No significant interval change.   Original Report Authenticated By: Burman Nieves, M.D.    Nursing notes, applicable records and vitals reviewed.  Radiologic Images/Reports reviewed.   1. Constipation       MDM   recurrent obstipation, with colonic inertia. Patient has a psychiatric overlay. No sign of obstruction or metabolic instability. Patient stable for discharge.     Plan: Home Medications- usual; Home Treatments- increase fluids/fiber; Recommended follow up- PCP prn      Flint Melter, MD 12/24/12 2323

## 2012-12-23 NOTE — ED Notes (Signed)
Pt alert, arrives from home, c/o abd pain, constipation, arrives with care giver, resp even unlabored, skin pwd, per family, last BM 3 days ago, poor PO intake past few days, not taking prescribed medications

## 2012-12-23 NOTE — ED Notes (Signed)
Per sister / caretaker; pt has had no BM in 3 days and had not been taking medications; sister states that she has tried to put her medications in her food and pt refuses to take them; sister reports that pt has these "episodes of constipation every few months"; pt has not been taking HTN medication at home either per family report; abd rounded and distended; soft to palpation; family denies vomiting; poor po intake x 2 days.

## 2012-12-24 LAB — COMPREHENSIVE METABOLIC PANEL
ALT: 23 U/L (ref 0–35)
AST: 24 U/L (ref 0–37)
CO2: 25 mEq/L (ref 19–32)
Chloride: 101 mEq/L (ref 96–112)
Creatinine, Ser: 0.53 mg/dL (ref 0.50–1.10)
GFR calc non Af Amer: >90 mL/min (ref >90)
Total Bilirubin: 0.5 mg/dL (ref 0.3–1.2)

## 2012-12-24 MED ORDER — LORAZEPAM 1 MG PO TABS
1.0000 mg | ORAL_TABLET | Freq: Once | ORAL | Status: AC
  Administered 2012-12-24: 1 mg via ORAL
  Filled 2012-12-24: qty 1

## 2013-02-11 ENCOUNTER — Emergency Department (HOSPITAL_COMMUNITY): Payer: Medicare Other

## 2013-02-11 ENCOUNTER — Encounter (HOSPITAL_COMMUNITY): Payer: Self-pay | Admitting: *Deleted

## 2013-02-11 ENCOUNTER — Inpatient Hospital Stay (HOSPITAL_COMMUNITY)
Admission: EM | Admit: 2013-02-11 | Discharge: 2013-02-14 | DRG: 391 | Disposition: A | Discharge status: Home / Self Care | Payer: Medicare Other | Attending: Internal Medicine | Admitting: Internal Medicine

## 2013-02-11 DIAGNOSIS — R109 Unspecified abdominal pain: Secondary | ICD-10-CM

## 2013-02-11 DIAGNOSIS — K567 Ileus, unspecified: Secondary | ICD-10-CM

## 2013-02-11 DIAGNOSIS — K5901 Slow transit constipation: Principal | ICD-10-CM | POA: Diagnosis present

## 2013-02-11 DIAGNOSIS — K560 Paralytic ileus: Secondary | ICD-10-CM

## 2013-02-11 DIAGNOSIS — Z79899 Other long term (current) drug therapy: Secondary | ICD-10-CM

## 2013-02-11 DIAGNOSIS — E876 Hypokalemia: Secondary | ICD-10-CM

## 2013-02-11 DIAGNOSIS — F79 Unspecified intellectual disabilities: Secondary | ICD-10-CM

## 2013-02-11 DIAGNOSIS — IMO0002 Reserved for concepts with insufficient information to code with codable children: Secondary | ICD-10-CM

## 2013-02-11 DIAGNOSIS — R451 Restlessness and agitation: Secondary | ICD-10-CM

## 2013-02-11 DIAGNOSIS — I10 Essential (primary) hypertension: Secondary | ICD-10-CM

## 2013-02-11 DIAGNOSIS — K5909 Other constipation: Secondary | ICD-10-CM

## 2013-02-11 DIAGNOSIS — G9340 Encephalopathy, unspecified: Secondary | ICD-10-CM

## 2013-02-11 DIAGNOSIS — K5900 Constipation, unspecified: Secondary | ICD-10-CM

## 2013-02-11 DIAGNOSIS — Z8673 Personal history of transient ischemic attack (TIA), and cerebral infarction without residual deficits: Secondary | ICD-10-CM

## 2013-02-11 DIAGNOSIS — E119 Type 2 diabetes mellitus without complications: Secondary | ICD-10-CM

## 2013-02-11 DIAGNOSIS — K599 Functional intestinal disorder, unspecified: Secondary | ICD-10-CM

## 2013-02-11 DIAGNOSIS — N390 Urinary tract infection, site not specified: Secondary | ICD-10-CM

## 2013-02-11 DIAGNOSIS — E43 Unspecified severe protein-calorie malnutrition: Secondary | ICD-10-CM | POA: Diagnosis present

## 2013-02-11 LAB — COMPREHENSIVE METABOLIC PANEL
BUN: 8 mg/dL (ref 6–23)
Calcium: 9.1 mg/dL (ref 8.4–10.5)
GFR calc Af Amer: >90 mL/min (ref >90)
Glucose, Bld: 96 mg/dL (ref 70–99)
Sodium: 142 mEq/L (ref 135–145)
Total Protein: 7.2 g/dL (ref 6.0–8.3)

## 2013-02-11 LAB — CBC WITH DIFFERENTIAL/PLATELET
Eosinophils Absolute: 0 10*3/uL (ref 0.0–0.7)
Lymphocytes Relative: 31 % (ref 12–46)
Lymphs Abs: 2.6 10*3/uL (ref 0.7–4.0)
Neutrophils Relative %: 62 % (ref 43–77)
Platelets: 317 10*3/uL (ref 150–400)
RBC: 4.22 MIL/uL (ref 3.87–5.11)
WBC: 8.5 10*3/uL (ref 4.0–10.5)

## 2013-02-11 LAB — URINE MICROSCOPIC-ADD ON

## 2013-02-11 LAB — URINALYSIS, ROUTINE W REFLEX MICROSCOPIC
Nitrite: NEGATIVE
Specific Gravity, Urine: 1.025 (ref 1.005–1.030)
Urobilinogen, UA: 1 mg/dL (ref 0.0–1.0)

## 2013-02-11 LAB — LIPASE, BLOOD: Lipase: 22 U/L (ref 11–59)

## 2013-02-11 MED ORDER — AMITRIPTYLINE HCL 25 MG PO TABS
25.0000 mg | ORAL_TABLET | Freq: Every day | ORAL | Status: DC
  Administered 2013-02-11 – 2013-02-13 (×3): 25 mg via ORAL
  Filled 2013-02-11 (×4): qty 1

## 2013-02-11 MED ORDER — ENOXAPARIN SODIUM 30 MG/0.3ML ~~LOC~~ SOLN
30.0000 mg | SUBCUTANEOUS | Status: DC
  Administered 2013-02-11 – 2013-02-13 (×3): 30 mg via SUBCUTANEOUS
  Filled 2013-02-11 (×4): qty 0.3

## 2013-02-11 MED ORDER — POLYETHYLENE GLYCOL 3350 17 G PO PACK
17.0000 g | PACK | Freq: Every day | ORAL | Status: DC
  Administered 2013-02-11 – 2013-02-12 (×2): 17 g via ORAL
  Filled 2013-02-11 (×2): qty 1

## 2013-02-11 MED ORDER — INSULIN ASPART 100 UNIT/ML ~~LOC~~ SOLN
0.0000 [IU] | Freq: Three times a day (TID) | SUBCUTANEOUS | Status: DC
  Administered 2013-02-12: 1 [IU] via SUBCUTANEOUS

## 2013-02-11 MED ORDER — ACETAMINOPHEN 325 MG PO TABS
650.0000 mg | ORAL_TABLET | Freq: Four times a day (QID) | ORAL | Status: DC | PRN
  Administered 2013-02-12: 650 mg via ORAL
  Filled 2013-02-11: qty 2

## 2013-02-11 MED ORDER — ONDANSETRON HCL 4 MG PO TABS: 4.0000 mg | ORAL_TABLET | Freq: Four times a day (QID) | ORAL | Status: DC | PRN

## 2013-02-11 MED ORDER — ONDANSETRON HCL 4 MG/2ML IJ SOLN
4.0000 mg | Freq: Once | INTRAMUSCULAR | Status: AC
  Administered 2013-02-11: 4 mg via INTRAVENOUS
  Filled 2013-02-11: qty 2

## 2013-02-11 MED ORDER — LISINOPRIL 40 MG PO TABS
40.0000 mg | ORAL_TABLET | Freq: Every day | ORAL | Status: DC
  Administered 2013-02-12 – 2013-02-14 (×3): 40 mg via ORAL
  Filled 2013-02-11 (×4): qty 1

## 2013-02-11 MED ORDER — ONDANSETRON HCL 4 MG/2ML IJ SOLN: 4.0000 mg | Freq: Four times a day (QID) | INTRAMUSCULAR | Status: DC | PRN

## 2013-02-11 MED ORDER — CARVEDILOL 25 MG PO TABS
25.0000 mg | ORAL_TABLET | Freq: Every day | ORAL | Status: DC
  Administered 2013-02-12 – 2013-02-14 (×3): 25 mg via ORAL
  Filled 2013-02-11 (×4): qty 1

## 2013-02-11 MED ORDER — LORAZEPAM 2 MG/ML IJ SOLN
1.0000 mg | Freq: Once | INTRAMUSCULAR | Status: AC
  Administered 2013-02-11: 1 mg via INTRAVENOUS
  Filled 2013-02-11: qty 1

## 2013-02-11 MED ORDER — LISINOPRIL 40 MG PO TABS
40.0000 mg | ORAL_TABLET | Freq: Once | ORAL | Status: AC
  Administered 2013-02-11: 40 mg via ORAL
  Filled 2013-02-11: qty 1

## 2013-02-11 MED ORDER — METOCLOPRAMIDE HCL 5 MG PO TABS
5.0000 mg | ORAL_TABLET | Freq: Three times a day (TID) | ORAL | Status: DC
  Administered 2013-02-11 – 2013-02-14 (×9): 5 mg via ORAL
  Filled 2013-02-11 (×15): qty 1

## 2013-02-11 MED ORDER — POTASSIUM CHLORIDE IN NACL 20-0.9 MEQ/L-% IV SOLN
INTRAVENOUS | Status: DC
  Administered 2013-02-11 – 2013-02-13 (×2): via INTRAVENOUS
  Administered 2013-02-13: 1000 mL via INTRAVENOUS
  Filled 2013-02-11 (×6): qty 1000

## 2013-02-11 MED ORDER — LACTULOSE 10 GM/15ML PO SOLN
10.0000 g | Freq: Every day | ORAL | Status: DC
  Administered 2013-02-12 – 2013-02-14 (×3): 10 g via ORAL
  Filled 2013-02-11 (×3): qty 15

## 2013-02-11 MED ORDER — SODIUM CHLORIDE 0.9 % IV SOLN: 1000.0000 mL | INTRAVENOUS | Status: DC

## 2013-02-11 MED ORDER — ACETAMINOPHEN 650 MG RE SUPP: 650.0000 mg | Freq: Four times a day (QID) | RECTAL | Status: DC | PRN

## 2013-02-11 MED ORDER — SODIUM CHLORIDE 0.9 % IV SOLN
1000.0000 mL | Freq: Once | INTRAVENOUS | Status: AC
  Administered 2013-02-11: 1000 mL via INTRAVENOUS

## 2013-02-11 MED ORDER — DEXTROSE-NACL 5-0.45 % IV SOLN: INTRAVENOUS | Status: DC

## 2013-02-11 NOTE — ED Notes (Signed)
Unable to find vein for IV start. IV team paged to assess pt.

## 2013-02-11 NOTE — Progress Notes (Signed)
[facility] ED CM consulted by ED patient advocate when sister/caregiver of pt request to speak with someone about possible home health services and DME CM spoke with sister and reviewed reason for cm consult Sister takes care of pt since childhood years and is noting that pt is becoming more reluctant to take her medications and refusing care especially soap suds enemas Sister having difficulty finding DME including soap suds enemas and ensure in bulk Sister believes pt may benefit from having someone other than her provide care at intervals Sister notes pt receives meds, etc from nurses in [facility] ED and on [facility] units.  CM inquired if pcp has offered assistance and informed he has not CM inquired if pt has tried Clinical biochemist for bulk supply of items and she stated "that costs money. Can the insurance not cover it?"  Discussed with her that home health agencies, CAPS/PCS and DME companies bill the insurance carrier Sister recalls in [place] medicare/medicaid assisted with DME and services.  Discussed home health candidate requirements, private duty nursing services and CAPS/PCS ([facility]) related services and coverage Referred her back to pcp for CAPS/PCS application process Provided list of [place] home health agencies and private duty nursing agencies.  Sister voiced understanding and appreciation of resources, information and services offered CM updated EDP, Lynelle Doctor about sister concerns and resources offered. Further evaluation of pt continues (possible admission) Sister confirmed pt is ambulates and does not think she is "home bound" (walks in malls, shopping centers, stores etc)

## 2013-02-11 NOTE — ED Notes (Signed)
Pharmacy tech at bedside to complete pts med rec.

## 2013-02-11 NOTE — ED Provider Notes (Signed)
History    CSN: [id number] Arrival date & time 02/11/13  1407 First MD Initiated Contact with Patient 02/11/13 1505     Chief Complaint  Patient presents with  . Constipation   HPI Pt has complex medical history including a stroke as a child. This resulted in diminished mental capacity. Patient requires constant home health care. She has a history of trouble with constipation. She has a home bowel regimen which requires laxatives and enemas. In the last 2 days she has had constipation again with abdominal swelling. She has not vomited but she refuses to take anything orally. Patient has not taken any of her medications including her blood pressure medications. Family states she's been slightly more agitated than normal. He has not had any fever. She has not had any coughing. Past Medical History  Diagnosis Date  . Hypertension   . Stroke   . Diabetes mellitus   . Chronic constipation     Past Surgical History  Procedure Laterality Date  . No past surgeries      Family History  Problem Relation Age of Onset  . Diabetes Mother     History  Substance Use Topics  . Smoking status: Never Smoker   . Smokeless tobacco: Never Used  . Alcohol Use: No     Comment: occasional wine    OB History   Grav Para Term Preterm Abortions TAB SAB Ect Mult Living                  Review of Systems  All other systems reviewed and are negative.    Allergies  Review of patient's allergies indicates no known allergies.  Home Medications   Current Outpatient Rx  Name  Route  Sig  Dispense  Refill  . amitriptyline (ELAVIL) 25 MG tablet   Oral   Take 25 mg by mouth at bedtime.         . carvedilol (COREG) 25 MG tablet   Oral   Take 25 mg by mouth daily.         Marland Kitchen HYDROcodone-acetaminophen (NORCO/VICODIN) 5-325 MG per tablet   Oral   Take 1 tablet by mouth every 6 (six) hours as needed.   10 tablet      . IRON PO   Oral   Take 1 tablet by mouth daily.         Marland Kitchen  lactulose (CHRONULAC) 10 GM/15ML solution   Oral   Take 10 g by mouth daily.         Marland Kitchen lisinopril (PRINIVIL,ZESTRIL) 40 MG tablet   Oral   Take 40 mg by mouth daily.         . metFORMIN (GLUCOPHAGE) 500 MG tablet   Oral   Take 500 mg by mouth 2 (two) times daily with a meal.         . metoCLOPramide (REGLAN) 5 MG tablet   Oral   Take 1 tablet (5 mg total) by mouth 4 (four) times daily -  before meals and at bedtime.   90 tablet   2   . Multiple Vitamins-Minerals (MULTIVITAMINS THER. W/MINERALS) TABS   Oral   Take 1 tablet by mouth daily.           . polyethylene glycol (MIRALAX / GLYCOLAX) packet   Oral   Take 17 g by mouth daily.           BP 208/97  Pulse 106  Temp(Src) 97.4 F (36.3 C) (Oral)  Resp  24  SpO2 100%  Physical Exam  Nursing note and vitals reviewed. Constitutional: No distress.  Thin, frail  HENT:  Head: Normocephalic and atraumatic.  Right Ear: External ear normal.  Left Ear: External ear normal.  Mouth/Throat: No oropharyngeal exudate.  Eyes: Conjunctivae are normal. Right eye exhibits no discharge. Left eye exhibits no discharge. No scleral icterus.  Neck: Neck supple. No tracheal deviation present.  Cardiovascular: Normal rate, regular rhythm and intact distal pulses.   Pulmonary/Chest: Effort normal and breath sounds normal. No stridor. No respiratory distress. She has no wheezes. She has no rales.  Abdominal: Soft. She exhibits distension. She exhibits no pulsatile midline mass and no mass. Bowel sounds are decreased. There is no tenderness. There is no rigidity, no rebound, no guarding and no CVA tenderness. No hernia.  Genitourinary: Rectal exam shows no external hemorrhoid.  Difficult to perform a rectal exam, increased anal tone in questionable stricture, no discrete mass, there was small amount of brown stool but no blood  Musculoskeletal: She exhibits no edema and no tenderness.  Neurological: She is alert. She has normal  strength. No sensory deficit. Cranial nerve deficit:  no gross defecits noted. She exhibits normal muscle tone. She displays no seizure activity. Coordination normal.  Skin: Skin is warm and dry. No rash noted.  Psychiatric: She has a normal mood and affect.    ED Course  Procedures (including critical care time)  Labs Reviewed  COMPREHENSIVE METABOLIC PANEL - Abnormal; Notable for the following:    Potassium 3.2 (*)    All other components within normal limits  URINALYSIS, ROUTINE W REFLEX MICROSCOPIC - Abnormal; Notable for the following:    Color, Urine AMBER (*)    APPearance CLOUDY (*)    Ketones, ur TRACE (*)    Protein, ur 30 (*)    Leukocytes, UA MODERATE (*)    All other components within normal limits  URINE MICROSCOPIC-ADD ON - Abnormal; Notable for the following:    Crystals CA OXALATE CRYSTALS (*)    All other components within normal limits  LIPASE, BLOOD  CBC WITH DIFFERENTIAL   Dg Abd Acute W/chest  02/11/2013  *RADIOLOGY REPORT*  Clinical Data: Abdominal pain and distention.  Chronic constipation.  ACUTE ABDOMEN SERIES (ABDOMEN 2 VIEW & CHEST 1 VIEW)  Comparison: Chest and two views abdomen 07/29/2012 and 12/23/2012.  Findings: Single view of the chest demonstrates clear lungs.  Heart size is normal.  No pneumothorax or pleural effusion.  Two views of the abdomen show no free intraperitoneal air.  As seen on the prior studies, there is marked gaseous distention of the colon diffusely.  No evidence of small bowel obstruction is identified.  Small to moderate volume of stool is seen in the cecum and ascending colon.  IMPRESSION:  1.  No acute finding. 2.  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   Original Report Authenticated By: Holley Dexter, M.D.     1. Adynamic ileus      MDM  The patient is a recurrent colonic ileus. I reviewed her old records. The patient unfortunately has recurrent episodes of this condition. On my rectal  exam I did not appreciate a large amount of stool or fecal impaction however the exam was limited by her increased rectal tone.  I have ordered a rectal tube and we'll admit the patient to the hospital for continued aggressive bowel treatment.  The patient is hypertensive in the emergency department. She had not been taking  her outpatient oral medications. I have given her a dose of her old blood pressure medications and we will continue to monitor.        Celene Kras, MD 02/11/13 [phone]

## 2013-02-11 NOTE — H&P (Signed)
History and Physical  DOLOROS KWOLEK ZOX:[id number] DOB: July 22, 1951 DOA: 02/11/2013  Referring physician: Linwood Dibbles, MD PCP: Paulino Rily, MD   Chief Complaint: Bloating  HPI:  62 year old woman PMH colonic inertia with multiple hospitalizations for exacerbations requiring aggressive bowel regimen and rectal tube decompression presented with recurrent abdominal distention for the last 4 days with minimal oral intake. Initial evaluation emergency room was notable for abdominal distention and abdominal film confirmed massive colonic distention.  History obtained from sister bedside. Patient is cared for by her sister at home and has a history of mental retardation and stroke as a child. She has had recurrent problems with colonic inertia and has been evaluated in the past by general surgery and gastroenterology. Conservative care has been recommended and the patient been treated in the past with rectal tube and enemas with good results.  The last 4 days the patient's oral intake has dropped off significantly and she has been refusing medications. Abdominal distention has worsened. There is been no vomiting and no obvious nausea. No clear pain. Last bowel movement 4 days ago. Sister has attempted to continue bowel regimen.  In ED noted to be afebrile, high BP intially, otherwise VSS. Screening labs unremarkable. AAS negative for acute disease. Notable for chronic distension of colon compatible with pseudo obstruction. EKG--ST, NSST changes  Chart Review:  Last admitted 11/2012 for colonic inertia, abdominal distension/pain.   Notable for multiple hospitalizations for chronic ileus/obstipation/inertia (03/2011, 05/2011, 07/2011, 08/2011, 10/2011, 11/2011, 02/2012, 09/2012 each time treated with bowel regimen +/- rectal tube for decompression. Last colonoscopy 03/2011. Seen by general surgery 05/2011 but not felt to be a candidate for diverting ileostomy, seen by GI 12/2011 with recommendations for  decompression and bowel regimen  Review of Systems:  Obtained from sister, as far she is aware: Negative for  visual changes, sore throat, rash, new muscle aches, chest pain, SOB, dysuria, bleeding, n/v/abdominal pain.  Positive for fever 101 two days ago.  Past Medical History  Diagnosis Date  . Hypertension   . Stroke   . Diabetes mellitus   . Chronic constipation     Past Surgical History  Procedure Laterality Date  . No past surgeries      Social History:  reports that she has never smoked. She has never used smokeless tobacco. She reports that she does not drink alcohol or use illicit drugs.  No Known Allergies  Family History  Problem Relation Age of Onset  . Diabetes Mother      Prior to Admission medications   Medication Sig Start Date End Date Taking? Authorizing Provider  amitriptyline (ELAVIL) 25 MG tablet Take 25 mg by mouth at bedtime.   Yes Historical Provider, MD  carvedilol (COREG) 25 MG tablet Take 25 mg by mouth daily. 02/28/12 02/27/13 Yes Clanford Cyndie Mull, MD  HYDROcodone-acetaminophen (NORCO/VICODIN) 5-325 MG per tablet Take 1 tablet by mouth every 6 (six) hours as needed. 11/21/12  Yes Standley Brooking, MD  IRON PO Take 1 tablet by mouth daily.   Yes Historical Provider, MD  lactulose (CHRONULAC) 10 GM/15ML solution Take 10 g by mouth daily.   Yes Historical Provider, MD  lisinopril (PRINIVIL,ZESTRIL) 40 MG tablet Take 40 mg by mouth daily.   Yes Historical Provider, MD  metFORMIN (GLUCOPHAGE) 500 MG tablet Take 500 mg by mouth 2 (two) times daily with a meal.   Yes Historical Provider, MD  metoCLOPramide (REGLAN) 5 MG tablet Take 1 tablet (5 mg total) by mouth 4 (four) times  daily -  before meals and at bedtime. 09/21/12  Yes Maryruth Bun Rama, MD  Multiple Vitamins-Minerals (MULTIVITAMINS THER. W/MINERALS) TABS Take 1 tablet by mouth daily.     Yes Historical Provider, MD  polyethylene glycol (MIRALAX / GLYCOLAX) packet Take 17 g by mouth daily.  11/21/12  Yes Standley Brooking, MD   Physical Exam: Filed Vitals:   02/11/13 1440 02/11/13 1530 02/11/13 1730 02/11/13 1800  BP: 208/97 200/95 139/83 113/62  Pulse: 106 91 87 78  Temp: 97.4 F (36.3 C)     TempSrc: Oral     Resp: 24 25 16 18   SpO2: 100% 100% 99% 97%    General:  Examined in the emergency department. Appears calm and comfortable. Does not speak but follows commands. Eyes: PERRL, normal lids, irises & conjunctiva ENT: grossly normal hearing, lips & tongue Neck: no LAD, masses or thyromegaly Cardiovascular: RRR, no m/r/g. No LE edema. Respiratory: CTA bilaterally, no w/r/r. Normal respiratory effort. Abdomen: soft, nontender, moderate distention, decreased bowel sounds Skin: no rash or induration seen on limited exam Musculoskeletal: Moves both feet to command. Right upper extremity with chronic contracture. Psychiatric: Does not speak but follows commands as above. Cannot assess mood or affect. Neurologic: As above  Wt Readings from Last 3 Encounters:  11/19/12 47.4 kg (104 lb 8 oz)  09/17/12 49.6 kg (109 lb 5.6 oz)  02/27/12 49.896 kg (110 lb)    Labs on Admission:  Basic Metabolic Panel:  Recent Labs Lab 02/11/13 1620  NA 142  K 3.2*  CL 104  CO2 26  GLUCOSE 96  BUN 8  CREATININE 0.50  CALCIUM 9.1    Liver Function Tests:  Recent Labs Lab 02/11/13 1620  AST 22  ALT 16  ALKPHOS 76  BILITOT 0.5  PROT 7.2  ALBUMIN 3.5    Recent Labs Lab 02/11/13 1620  LIPASE 22   CBC:  Recent Labs Lab 02/11/13 1620  WBC 8.5  NEUTROABS 5.3  HGB 12.3  HCT 37.1  MCV 87.9  PLT 317    Radiological Exams on Admission: Dg Abd Acute W/chest  02/11/2013  *RADIOLOGY REPORT*  Clinical Data: Abdominal pain and distention.  Chronic constipation.  ACUTE ABDOMEN SERIES (ABDOMEN 2 VIEW & CHEST 1 VIEW)  Comparison: Chest and two views abdomen 07/29/2012 and 12/23/2012.  Findings: Single view of the chest demonstrates clear lungs.  Heart size is normal.  No  pneumothorax or pleural effusion.  Two views of the abdomen show no free intraperitoneal air.  As seen on the prior studies, there is marked gaseous distention of the colon diffusely.  No evidence of small bowel obstruction is identified.  Small to moderate volume of stool is seen in the cecum and ascending colon.  IMPRESSION:  1.  No acute finding. 2.  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   Original Report Authenticated By: Holley Dexter, M.D.     EKG: Independently reviewed. As above   Principal Problem:   Colonic inertia Active Problems:   DM type 2 (diabetes mellitus, type 2)   HTN (hypertension)   Hypokalemia   Constipation, chronic   Mental retardation   Assessment/Plan 1. Acute on chronic colonic inertia with abdominal distention and acute on chronic constipation: Place rectal tube, soapsuds enemas as these modalities have been effective on multiple previous admissions. No signs or symptoms to suggest acute abdominal process. 2. Hypokalemia: Replete. 3. Equivocal urinalysis: No definite symptoms. Followup culture. 4. HTN: Stable. Continue Coreg, lisinopril.  5. History of stroke, mental retardation: at baseline speaks to no one but her sister.  6. DM type 2--appears stable. Hold metformin while inpatient. Sliding scale insulin.   Code Status: Full code Family Communication: Discussed with sister at bedside Disposition Plan/Anticipated LOS: Admit observation, one to 2 days  Time spent: 35 minutes  [name], MD  [facility] Pager [phone] 02/11/2013, 6:15 PM

## 2013-02-11 NOTE — ED Notes (Addendum)
Pt arrives from home with sister/caretaker. Reports pt is constipated "and stool is backing up in her." reports LBM 4 days ago." family member is also asking for pt to have a soap suds enema and be checked for "obstuction."

## 2013-02-11 NOTE — Progress Notes (Signed)
CM provided sister with information on CAPS/PCS covered by medicare/medicaid. CM consulted with a home health DME staff member [facility]) to about suppliers of soap suds enemas covered by medicaid. Recommendation was to try [facility]. Sister provided with information for [facility] supply of soap suds enema

## 2013-02-12 DIAGNOSIS — K560 Paralytic ileus: Secondary | ICD-10-CM

## 2013-02-12 LAB — GLUCOSE, CAPILLARY
Glucose-Capillary: 121 mg/dL — ABNORMAL HIGH (ref 70–99)
Glucose-Capillary: 97 mg/dL (ref 70–99)

## 2013-02-12 LAB — BASIC METABOLIC PANEL
CO2: 25 mEq/L (ref 19–32)
Calcium: 8.1 mg/dL — ABNORMAL LOW (ref 8.4–10.5)
Chloride: 106 mEq/L (ref 96–112)
Sodium: 137 mEq/L (ref 135–145)

## 2013-02-12 MED ORDER — POLYETHYLENE GLYCOL 3350 17 G PO PACK
17.0000 g | PACK | Freq: Two times a day (BID) | ORAL | Status: DC
  Administered 2013-02-12 – 2013-02-14 (×4): 17 g via ORAL
  Filled 2013-02-12 (×5): qty 1

## 2013-02-12 MED ORDER — LORAZEPAM 0.5 MG PO TABS
0.5000 mg | ORAL_TABLET | Freq: Two times a day (BID) | ORAL | Status: DC | PRN
  Administered 2013-02-12 – 2013-02-13 (×2): 0.5 mg via ORAL
  Filled 2013-02-12 (×2): qty 1

## 2013-02-12 NOTE — Progress Notes (Signed)
[facility] PROGRESS NOTE  [name] NFA:[id number] DOB: [birth year] DOA: 02/11/2013 PCP: Paulino Rily, MD  Assessment/Plan: 1.Acute on chronic colonic inertia with abdominal distention and acute on chronic constipation:  -d/c rectal tube as pt does not want reinsertion -repeat soapsuds enemas -increase miralax and continue senokot -repeat abd xray in am 2.Hypokalemia: Resolved. 3.Equivocal urinalysis: No definite symptoms. Followup culture. 4.HTN: Stable. Continue Coreg, lisinopril. 5.History of stroke, mental retardation: at baseline speaks to no one but her sister.  6.DM type 2--continue Sliding scale insulin.     Code Status: full Family Communication: sis. at bedside Disposition Plan: to home with sis when medically stable   Consultants:  none  Procedures:  none  Antibiotics:  none  HPI/Subjective: Pt denies pain, she is sitting on commode -s/p enema. Per nsg she removed rectal tube and refused reinsertion; also nurse states stool was onlu in the tubing of rectal tube but the bag was empty.tolerating some clares per nsg  Objective: Filed Vitals:   02/11/13 2000 02/11/13 2042 02/12/13 0536 02/12/13 1437  BP: 157/67 180/80 189/77 156/79  Pulse:  79 68 72  Temp:  98.3 F (36.8 C) 97.7 F (36.5 C) 97.7 F (36.5 C)  TempSrc:  Oral Oral Oral  Resp: 15 16 16 16   Height:  4\' 5"  (1.346 m)    Weight:  42.5 kg (93 lb 11.1 oz) 42.496 kg (93 lb 11 oz)   SpO2:  100% 100% 100%    Intake/Output Summary (Last 24 hours) at 02/12/13 1541 Last data filed at 02/12/13 1300  Gross per 24 hour  Intake    240 ml  Output    300 ml  Net    -60 ml   Filed Weights   02/11/13 2042 02/12/13 0536  Weight: 42.5 kg (93 lb 11.1 oz) 42.496 kg (93 lb 11 oz)    Exam:   General:  Older female in NAD, Alert, does not answer questions asked regarding orientation  Cardiovascular: RRR  Respiratory: CTAB  Abdomen:Soft, +distension, NT, +BS  Extremities: no edema,  no cyanosis   Data Reviewed: Basic Metabolic Panel:  Recent Labs Lab 02/11/13 1620 02/12/13 0352  NA 142 137  K 3.2* 5.0  CL 104 106  CO2 26 25  GLUCOSE 96 78  BUN 8 6  CREATININE 0.50 0.48*  CALCIUM 9.1 8.1*   Liver Function Tests:  Recent Labs Lab 02/11/13 1620  AST 22  ALT 16  ALKPHOS 76  BILITOT 0.5  PROT 7.2  ALBUMIN 3.5    Recent Labs Lab 02/11/13 1620  LIPASE 22   No results found for this basename: AMMONIA,  in the last 168 hours CBC:  Recent Labs Lab 02/11/13 1620  WBC 8.5  NEUTROABS 5.3  HGB 12.3  HCT 37.1  MCV 87.9  PLT 317   Cardiac Enzymes: No results found for this basename: CKTOTAL, CKMB, CKMBINDEX, TROPONINI,  in the last 168 hours BNP (last 3 results) No results found for this basename: PROBNP,  in the last 8760 hours CBG:  Recent Labs Lab 02/11/13 2318 02/12/13 0741 02/12/13 1201  GLUCAP 121* 121* 97    No results found for this or any previous visit (from the past 240 hour(s)).   Studies: Dg Abd Acute W/chest  02/11/2013  *RADIOLOGY REPORT*  Clinical Data: Abdominal pain and distention.  Chronic constipation.  ACUTE ABDOMEN SERIES (ABDOMEN 2 VIEW & CHEST 1 VIEW)  Comparison: Chest and two views abdomen 07/29/2012 and 12/23/2012.  Findings: Single view of  the chest demonstrates clear lungs.  Heart size is normal.  No pneumothorax or pleural effusion.  Two views of the abdomen show no free intraperitoneal air.  As seen on the prior studies, there is marked gaseous distention of the colon diffusely.  No evidence of small bowel obstruction is identified.  Small to moderate volume of stool is seen in the cecum and ascending colon.  IMPRESSION:  1.  No acute finding. 2.  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   Original Report Authenticated By: Holley Dexter, M.D.     Scheduled Meds: . amitriptyline  25 mg Oral QHS  . carvedilol  25 mg Oral Daily  . enoxaparin (LOVENOX) injection  30 mg  Subcutaneous Q24H  . insulin aspart  0-9 Units Subcutaneous TID WC  . lactulose  10 g Oral Daily  . lisinopril  40 mg Oral Daily  . metoCLOPramide  5 mg Oral TID AC & HS  . polyethylene glycol  17 g Oral BID   Continuous Infusions: . 0.9 % NaCl with KCl 20 mEq / L 75 mL/hr at 02/11/13 2208    Principal Problem:   Colonic inertia Active Problems:   DM type 2 (diabetes mellitus, type 2)   HTN (hypertension)   Hypokalemia   Constipation, chronic   Mental retardation    Time spent:    [name]  [facility] Pager [phone]. If 7PM-7AM, please contact night-coverage at [email or web address], password [facility] 02/12/2013, 3:41 PM  LOS: 1 day

## 2013-02-12 NOTE — Progress Notes (Signed)
INITIAL NUTRITION ASSESSMENT  Pt meets criteria for severe MALNUTRITION in the context of chronic illness as evidenced by <75% estimated energy intake in the past month with 36.7% weight loss in the past 6 months per sister's report.  DOCUMENTATION CODES Per approved criteria  -Severe malnutrition in the context of chronic illness   INTERVENTION: - Diet advancement per MD - Discussed with sister ways to improve pt's calorie/protein intake - Ensure Complete TID when diet advanced - Will continue to monitor   NUTRITION DIAGNOSIS: Inadequate oral intake related to clear liquid diet as evidenced by diet order.   Goal: Advance diet as tolerated to diabetic diet.   Monitor:  Weights, labs, diet advancement, BM  Reason for Assessment: Consult  62 y.o. female  Admitting Dx: Colonic inertia  ASSESSMENT: Pt with history of mental retardation and stroke. Has adult sister as caretaker. Pt admitted with colonic inertia. Sister reports pt has not eaten anything for the past 4 days. She reports before then she was doing 3 small meals, 1 snack, and 1 Ensure/day. She reports pt's portion sizes have been going down. She reports pt has lost 54 pounds unintentionally in the past 6 months. Sister reports pt's doctor told her to get in 2-3 Ensure/day. She reports pt does not have any problems chewing or swallowing but has bad teeth. Performed nutrition focused physical exam.   Nutrition Focused Physical Exam:  Subcutaneous Fat:  Orbital Region: mild/moderate wasting Upper Arm Region: WNL Thoracic and Lumbar Region: N/A  Muscle:  Temple Region: mild/moderate wasting Clavicle Bone Region: WNL Clavicle and Acromion Bone Region: WNL Scapular Bone Region: N/A Dorsal Hand: WNL Patellar Region: WNL Anterior Thigh Region: WNL Posterior Calf Region: WNL  Edema: None observed    Height: Ht Readings from Last 1 Encounters:  02/11/13 4\' 5"  (1.346 m)    Weight: Wt Readings from Last 1  Encounters:  02/12/13 93 lb 11 oz (42.496 kg)    Ideal Body Weight: 88 lb  % Ideal Body Weight: 106  Wt Readings from Last 10 Encounters:  02/12/13 93 lb 11 oz (42.496 kg)  11/19/12 104 lb 8 oz (47.4 kg)  09/17/12 109 lb 5.6 oz (49.6 kg)  02/27/12 110 lb (49.896 kg)  12/10/11 110 lb 9.6 oz (50.168 kg)  10/24/11 114 lb 10.2 oz (52 kg)    Usual Body Weight: 149 lb 6 months ago  % Usual Body Weight: 62  BMI:  Body mass index is 23.46 kg/(m^2).  Estimated Nutritional Needs: Kcal: 1050-1300 Protein: 50-60g Fluid: 1-1.3L/day  Skin: Intact  Diet Order: Clear Liquid  EDUCATION NEEDS: -No education needs identified at this time   Intake/Output Summary (Last 24 hours) at 02/12/13 1411 Last data filed at 02/12/13 0800  Gross per 24 hour  Intake    120 ml  Output      0 ml  Net    120 ml    Last BM: 3/1   Labs:   Recent Labs Lab 02/11/13 1620 02/12/13 0352  NA 142 137  K 3.2* 5.0  CL 104 106  CO2 26 25  BUN 8 6  CREATININE 0.50 0.48*  CALCIUM 9.1 8.1*  GLUCOSE 96 78    CBG (last 3)   Recent Labs  02/11/13 2318 02/12/13 0741 02/12/13 1201  GLUCAP 121* 121* 97    Scheduled Meds: . amitriptyline  25 mg Oral QHS  . carvedilol  25 mg Oral Daily  . enoxaparin (LOVENOX) injection  30 mg Subcutaneous Q24H  .  insulin aspart  0-9 Units Subcutaneous TID WC  . lactulose  10 g Oral Daily  . lisinopril  40 mg Oral Daily  . metoCLOPramide  5 mg Oral TID AC & HS  . polyethylene glycol  17 g Oral BID    Continuous Infusions: . 0.9 % NaCl with KCl 20 mEq / L 75 mL/hr at 02/11/13 2208    Past Medical History  Diagnosis Date  . Hypertension   . Stroke   . Diabetes mellitus   . Chronic constipation     Past Surgical History  Procedure Laterality Date  . No past surgeries       Levon Hedger MS, RD, LDN [phone] Pager [phone] After Hours Pager

## 2013-02-12 NOTE — Care Management (Signed)
CARE MANAGEMENT NOTE 02/12/2013  Patient:  [name], [name]   Account Number:  [id number]  Date Initiated:  02/12/2013  Documentation initiated by:  DAVIS,TYMEEKA  Subjective/Objective Assessment:   62 yo female admitted with colonic inertia. PTA pt from home Hx of mental retardation and stroke. Adult sister is caretaker.     Action/Plan:   Home when stable   Anticipated DC Date:  02/13/2013   Anticipated DC Plan:  HOME W HOME HEALTH SERVICES      DC Planning Services  CM consult      Choice offered to / List presented to:  C-5 Sibling        HH arranged  HH-1 RN  HH-4 NURSE'S AIDE      Status of service:  In process, will continue to follow Medicare Important Message given?   (If response is "NO", the following Medicare IM given date Stroder will be blank) Date Medicare IM given:   Date Additional Medicare IM given:    Discharge Disposition:    Per UR Regulation:  Reviewed for med. necessity/level of care/duration of stay  If discussed at Long Length of Stay Meetings, dates discussed:    Comments:  02/12/13 1120 Tymeeka Davis,RN,BSN [phone] Cm spoke at bedside with pt's caretaker, adult sister. pt's sister request [facility] services to assist with medication management & enemas. Pt's sister provided with choice list for [facility]. No choice made at this time.

## 2013-02-13 ENCOUNTER — Inpatient Hospital Stay (HOSPITAL_COMMUNITY): Payer: Medicare Other

## 2013-02-13 DIAGNOSIS — I10 Essential (primary) hypertension: Secondary | ICD-10-CM

## 2013-02-13 LAB — BASIC METABOLIC PANEL
BUN: 4 mg/dL — ABNORMAL LOW (ref 6–23)
Calcium: 8.1 mg/dL — ABNORMAL LOW (ref 8.4–10.5)
Creatinine, Ser: 0.49 mg/dL — ABNORMAL LOW (ref 0.50–1.10)
GFR calc non Af Amer: >90 mL/min (ref >90)
Glucose, Bld: 81 mg/dL (ref 70–99)

## 2013-02-13 LAB — URINE CULTURE: Colony Count: 30000

## 2013-02-13 MED ORDER — SORBITOL 70 % SOLN
960.0000 mL | TOPICAL_OIL | Freq: Once | ORAL | Status: AC
  Administered 2013-02-13: 960 mL via RECTAL
  Filled 2013-02-13: qty 240

## 2013-02-13 MED ORDER — SORBITOL 70 % SOLN: 960.0000 mL | TOPICAL_OIL | Freq: Once | ORAL | Status: AC | PRN

## 2013-02-13 MED ORDER — ENSURE COMPLETE PO LIQD
237.0000 mL | Freq: Three times a day (TID) | ORAL | Status: DC
  Administered 2013-02-13 – 2013-02-14 (×3): 237 mL via ORAL

## 2013-02-13 NOTE — Progress Notes (Signed)
[facility] PROGRESS NOTE  [name] ZOX:[id number] DOB: [birth year] DOA: 02/11/2013 PCP: Cynthia Rily, MD  Assessment/Plan: 1.Acute on chronic colonic inertia with abdominal distention and acute on chronic constipation:  -had BM with soap sud enema, bu not so far today -will give SMOG enema and follow -icontinue miralax and continue senokot -abd xray this am with no signs of obstruction- persistent chronic dilatation, similar to prior -Advance diet and if tolerates will DC home soon. 2.Hypokalemia: Resolved. 3.Equivocal urinalysis: No definite symptoms. urine culture with contaminant. 4.HTN: Stable. Continue Coreg, lisinopril. 5.History of stroke, mental retardation: at baseline speaks to no one but her sister.  6.DM type 2--continue Sliding scale insulin. 7.severe MALNUTRITION in the context of chronic illness -Patient seen by dietitian, ensure complete follow.      Code Status: full Family Communication: sis. at bedside Disposition Plan: to home with sis when medically stable   Consultants:  none  Procedures:  none  Antibiotics:  none  HPI/Subjective: Pt denies pain, per sis had BM with eneam but none since then. Not eating much yet per sister  Objective: Filed Vitals:   02/12/13 0536 02/12/13 1437 02/12/13 2134 02/13/13 0535  BP: 189/77 156/79 107/57 109/52  Pulse: 68 72 63 56  Temp: 97.7 F (36.5 C) 97.7 F (36.5 C) 97.8 F (36.6 C) 98.2 F (36.8 C)  TempSrc: Oral Oral Oral Oral  Resp: 16 16 16 16   Height:      Weight: 42.496 kg (93 lb 11 oz)     SpO2: 100% 100% 100% 100%    Intake/Output Summary (Last 24 hours) at 02/13/13 1015 Last data filed at 02/13/13 0535  Gross per 24 hour  Intake    240 ml  Output    801 ml  Net   -561 ml   Filed Weights   02/11/13 2042 02/12/13 0536  Weight: 42.5 kg (93 lb 11.1 oz) 42.496 kg (93 lb 11 oz)    Exam:   General:  Older female in NAD, Alert, does not answer questions asked regarding  orientation  Cardiovascular: RRR  Respiratory: CTAB  Abdomen:Soft, mild distension, NT, +BS  Extremities: no edema, no cyanosis   Data Reviewed: Basic Metabolic Panel:  Recent Labs Lab 02/11/13 1620 02/12/13 0352 02/13/13 0350  NA 142 137 139  K 3.2* 5.0 3.6  CL 104 106 106  CO2 26 25 27   GLUCOSE 96 78 81  BUN 8 6 4*  CREATININE 0.50 0.48* 0.49*  CALCIUM 9.1 8.1* 8.1*   Liver Function Tests:  Recent Labs Lab 02/11/13 1620  AST 22  ALT 16  ALKPHOS 76  BILITOT 0.5  PROT 7.2  ALBUMIN 3.5    Recent Labs Lab 02/11/13 1620  LIPASE 22   No results found for this basename: AMMONIA,  in the last 168 hours CBC:  Recent Labs Lab 02/11/13 1620  WBC 8.5  NEUTROABS 5.3  HGB 12.3  HCT 37.1  MCV 87.9  PLT 317   Cardiac Enzymes: No results found for this basename: CKTOTAL, CKMB, CKMBINDEX, TROPONINI,  in the last 168 hours BNP (last 3 results) No results found for this basename: PROBNP,  in the last 8760 hours CBG:  Recent Labs Lab 02/12/13 1201 02/12/13 1703 02/12/13 1827 02/12/13 2150 02/13/13 0753  GLUCAP 97 65* 101* 80 76    Recent Results (from the past 240 hour(s))  URINE CULTURE     Status: None   Collection Time    02/12/13  5:39 AM  Result Value Range Status   Specimen Description URINE, RANDOM   Final   Special Requests NONE   Final   Culture  Setup Time 02/12/2013 09:45   Final   Colony Count 30,000 COLONIES/ML   Final   Culture     Final   Value: Multiple bacterial morphotypes present, none predominant. Suggest appropriate recollection if clinically indicated.   Report Status 02/13/2013 FINAL   Final     Studies: Dg Abd 2 Views  02/13/2013  *RADIOLOGY REPORT*  Clinical Data: Follow-up evaluation for abdominal distension.  ABDOMEN - 2 VIEW  Comparison: Acute abdominal series 02/11/2013.  Findings: Again noted is a massive gaseous distension of the colon, particularly in the distal descending colon and sigmoid colon. Overall, the  degree of colonic distension has decreased compared to the prior study.  No definite pathologic distension of small bowel. Air-fluid levels are noted within the colon left lateral decubitus view. No definite pneumoperitoneum.  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  IMPRESSION: 1.  Nonspecific bowel gas pattern with persistent chronic dilatation of the colon, similar to numerous prior examinations. No signs of small bowel obstruction at this time.   Original Report Authenticated By: Trudie Reed, M.D.    Dg Abd Acute W/chest  02/11/2013  *RADIOLOGY REPORT*  Clinical Data: Abdominal pain and distention.  Chronic constipation.  ACUTE ABDOMEN SERIES (ABDOMEN 2 VIEW & CHEST 1 VIEW)  Comparison: Chest and two views abdomen 07/29/2012 and 12/23/2012.  Findings: Single view of the chest demonstrates clear lungs.  Heart size is normal.  No pneumothorax or pleural effusion.  Two views of the abdomen show no free intraperitoneal air.  As seen on the prior studies, there is marked gaseous distention of the colon diffusely.  No evidence of small bowel obstruction is identified.  Small to moderate volume of stool is seen in the cecum and ascending colon.  IMPRESSION:  1.  No acute finding. 2.  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   Original Report Authenticated By: Holley Dexter, M.D.     Scheduled Meds: . amitriptyline  25 mg Oral QHS  . carvedilol  25 mg Oral Daily  . enoxaparin (LOVENOX) injection  30 mg Subcutaneous Q24H  . insulin aspart  0-9 Units Subcutaneous TID WC  . lactulose  10 g Oral Daily  . lisinopril  40 mg Oral Daily  . metoCLOPramide  5 mg Oral TID AC & HS  . polyethylene glycol  17 g Oral BID   Continuous Infusions: . 0.9 % NaCl with KCl 20 mEq / L 75 mL/hr at 02/13/13 0013    Principal Problem:   Colonic inertia Active Problems:   DM type 2 (diabetes mellitus, type 2)   HTN (hypertension)    Hypokalemia   Constipation, chronic   Mental retardation    Time spent:    [name]  [facility] Pager [phone]. If 7PM-7AM, please contact night-coverage at [email or web address], password [facility] 02/13/2013, 10:15 AM  LOS: 2 days

## 2013-02-13 NOTE — Clinical Documentation Improvement (Signed)
MALNUTRITION DOCUMENTATION CLARIFICATION  THIS DOCUMENT IS NOT A PERMANENT PART OF THE MEDICAL RECORD  TO RESPOND TO THE THIS QUERY, FOLLOW THE INSTRUCTIONS BELOW:  1. If needed, update documentation for the patient's encounter via the notes activity.  2. Access this query again and click edit on the In [employer].  3. After updating, or not, click F2 to complete all highlighted (required) [place] concerning your review. Select "additional documentation in the medical record" OR "no additional documentation provided".  4. Click Sign note button.  5. The deficiency will fall out of your In Basket *Please let us know if you are not able to complete this workflow by phone or e-mail (listed below).  Please update your documentation within the medical record to reflect your response to this query.                                                                                        02/13/13   Dear Dr. Suanne Marker / Associates,  In a better effort to capture your patient's severity of illness, reflect appropriate length of stay and utilization of resources, a review of the patient medical record has revealed the following indicators.    Based on your clinical judgment, please clarify and document in a progress note and/or discharge summary the clinical condition associated with the following supporting information:  In responding to this query please exercise your independent judgment.  The fact that a query is asked, does not imply that any particular answer is desired or expected.  According to  nutrition consult note on 02/12/13  patient meets criteria for "severe malnutrition in the context of chronic illness"    If this is an appropriate diagnosis please document, if not please clarify the malnutrition status of patient if known. Thank you.  Possible Clinical Conditions?  _______Moderate Malnutrition _______Severe Malnutrition   _______Protein Calorie Malnutrition _______Severe Protein  Calorie Malnutrition  _______Other Condition________________ _______Cannot clinically determine     Supporting Information: Risk Factors: Colonic ileus  Signs & Symptoms: -Ht:  23ft 5 inches      Wt: 98 lbs  -BMI:23.46 kg/(m^2).  Weight  Loss:reports pt has lost 54 pounds unintentionally in the past 6 months.  --Calcium level: 02/12/13> 8.1  INTERVENTION: - Diet advancement per MD - Discussed with sister ways to improve pt's calorie/protein intake - Ensure Complete TID when diet advanced - Will continue to monitor   -Nutrition Consult: 02/12/2013  Pt meets criteria for severe MALNUTRITION in the context of chronic illness as evidenced by <75% estimated energy intake in the past month with 36.7% weight loss in the past 6 months per sister's report.    You may use possible, probable, or suspect with inpatient documentation. possible, probable, suspected diagnoses MUST be documented at the time of discharge  Reviewed: additional documentation in the medical record  Thank Barrie Dunker RN,BSN  Clinical Documentation Specialist:  Pager # [phone] Office [phone] Health Information Management [facility]

## 2013-02-14 DIAGNOSIS — E876 Hypokalemia: Secondary | ICD-10-CM

## 2013-02-14 LAB — GLUCOSE, CAPILLARY
Glucose-Capillary: 100 mg/dL — ABNORMAL HIGH (ref 70–99)
Glucose-Capillary: 125 mg/dL — ABNORMAL HIGH (ref 70–99)

## 2013-02-14 MED ORDER — LACTULOSE 10 GM/15ML PO SOLN: 20.0000 g | Freq: Every day | ORAL | Status: DC

## 2013-02-14 MED ORDER — HYDROCODONE-ACETAMINOPHEN 5-325 MG PO TABS: 1.0000 | ORAL_TABLET | Freq: Four times a day (QID) | ORAL | Status: DC | PRN

## 2013-02-14 MED ORDER — METOCLOPRAMIDE HCL 5 MG PO TABS: 5.0000 mg | ORAL_TABLET | Freq: Three times a day (TID) | ORAL | Status: DC

## 2013-02-14 NOTE — Progress Notes (Signed)
Patient discharged to home via wheelchair with family, discharged instructions reviewed with sister who verbalized understanding. New RX's given to sister.

## 2013-02-14 NOTE — Discharge Summary (Signed)
Physician Discharge Summary  [name] NWG:[id number] DOB: 1951-12-07 DOA: 02/11/2013  PCP: Paulino Rily, MD  Admit date: 02/11/2013 Discharge date: 02/14/2013  Time spent: >30 minutes  Recommendations for Outpatient Follow-up:  1. PCP/Dr Knox Royalty in 1-2weeks, call for appt upon discharge  Discharge Diagnoses:  Principal Problem:   Colonic inertia Active Problems:   DM type 2 (diabetes mellitus, type 2)   HTN (hypertension)   Hypokalemia   Constipation, chronic   Mental retardation   Discharge Condition: improved  Diet recommendation: Regular  Filed Weights   02/11/13 2042 02/12/13 0536  Weight: 42.5 kg (93 lb 11.1 oz) 42.496 kg (93 lb 11 oz)    History of present illness:  62 year old woman PMH colonic inertia with multiple hospitalizations for exacerbations requiring aggressive bowel regimen and rectal tube decompression presented with recurrent abdominal distention for the last 4 days with minimal oral intake. Initial evaluation emergency room was notable for abdominal distention and abdominal film confirmed massive colonic distention.  History obtained from sister bedside. Patient is cared for by her sister at home and has a history of mental retardation and stroke as a child. She has had recurrent problems with colonic inertia and has been evaluated in the past by general surgery and gastroenterology. Conservative care has been recommended and the patient been treated in the past with rectal tube and enemas with good results.  The last 4 days the patient's oral intake has dropped off significantly and she has been refusing medications. Abdominal distention has worsened. There is been no vomiting and no obvious nausea. No clear pain. Last bowel movement 4 days ago. Sister has attempted to continue bowel regimen.  In ED noted to be afebrile, high BP intially, otherwise VSS. Screening labs unremarkable. AAS negative for acute disease. Notable for chronic distension of  colon compatible with pseudo obstruction. EKG--ST, NSST changes  Chart Review:  Last admitted 11/2012 for colonic inertia, abdominal distension/pain.  Notable for multiple hospitalizations for chronic ileus/obstipation/inertia (03/2011, 05/2011, 07/2011, 08/2011, 10/2011, 11/2011, 02/2012, 09/2012 each time treated with bowel regimen +/- rectal tube for decompression. Last colonoscopy 03/2011. Seen by general surgery 05/2011 but not felt to be a candidate for diverting ileostomy, seen by GI 12/2011 with recommendations for decompression and bowel regimen  She was admitted for further evaluation and management.  Hospital Course:  1.Acute on chronic colonic inertia with abdominal distention and acute on chronic constipation:  As discussed above upon admission pt had abdominal x-rays which showed no acute findings, chronic marked distention of the colon compatible with pseudoobstruction was noted. She was started on enemas- high soapsuds, and she had BMs with those. - She was also maintained on a bowel regimen with MiraLAX and Senokot. She also was given a SMOG enema on 3/7 - And she improved clinically the clear liquid diet she was placed on on admission was advanced to and she is tolerating this. - She had a followup abdomen x-ray done On 3/7 which showed persistent chronic dilatation similar to prior x-rays no signs of obstruction. - Her potassium was also corrected- last 5.0 prior to discharge. - She is medically stable for discharge and is to follow up outpatient with her PCP. 2.Hypokalemia: The potassium was replaced in the hospital.  3.Equivocal urinalysis: No definite symptoms. urine culture with contaminant. She remained afebrile and hemodynamically stable.  4.HTN: Stable. Continue Coreg, lisinopril.  5.History of stroke, mental retardation: at baseline speaks mostly her sister, noted to occasionally talk to other staff.Marland Kitchen  6.DM type  2- she was placed on sliding scale insulin while in the hospital,  she is to continue metformin upon discharge.  7.severe MALNUTRITION in the context of chronic illness  -Patient seen by dietitian, was placed on ensure complete.     Procedures:  none  Consultations:  none  Discharge Exam: Filed Vitals:   02/13/13 0535 02/13/13 1359 02/13/13 2127 02/14/13 0636  BP: 109/52 136/62 123/60 178/79  Pulse: 56 86 71 78  Temp: 98.2 F (36.8 C) 98.7 F (37.1 C) 97.8 F (36.6 C) 97.8 F (36.6 C)  TempSrc: Oral Oral Oral Oral  Resp: 16 18 16 16   Height:      Weight:      SpO2: 100% 100% 100% 100%   Exam:  General: Older female in NAD, Alert, does not answer questions asked regarding orientation  Cardiovascular: RRR  Respiratory: CTAB  Abdomen:Soft, +distension, NT, +BS  Extremities: no edema, no cyanosis     Discharge Instructions  Discharge Orders   Future Orders Complete By Expires     Diet general  As directed     Increase activity slowly  As directed         Medication List    TAKE these medications       amitriptyline 25 MG tablet  Commonly known as:  ELAVIL  Take 25 mg by mouth at bedtime.     carvedilol 25 MG tablet  Commonly known as:  COREG  Take 25 mg by mouth daily.     HYDROcodone-acetaminophen 5-325 MG per tablet  Commonly known as:  NORCO/VICODIN  Take 1 tablet by mouth every 6 (six) hours as needed.     IRON PO  Take 1 tablet by mouth daily.     lactulose 10 GM/15ML solution  Commonly known as:  CHRONULAC  Take 30 mLs (20 g total) by mouth daily.     lisinopril 40 MG tablet  Commonly known as:  PRINIVIL,ZESTRIL  Take 40 mg by mouth daily.     metFORMIN 500 MG tablet  Commonly known as:  GLUCOPHAGE  Take 500 mg by mouth 2 (two) times daily with a meal.     metoCLOPramide 5 MG tablet  Commonly known as:  REGLAN  Take 1 tablet (5 mg total) by mouth 4 (four) times daily -  before meals and at bedtime.     multivitamins ther. w/minerals Tabs  Take 1 tablet by mouth daily.     polyethylene glycol  packet  Commonly known as:  MIRALAX / GLYCOLAX  Take 17 g by mouth daily.          The results of significant diagnostics from this hospitalization (including imaging, microbiology, ancillary and laboratory) are listed below for reference.    Significant Diagnostic Studies: Dg Abd 2 Views  02/13/2013  *RADIOLOGY REPORT*  Clinical Data: Follow-up evaluation for abdominal distension.  ABDOMEN - 2 VIEW  Comparison: Acute abdominal series 02/11/2013.  Findings: Again noted is a massive gaseous distension of the colon, particularly in the distal descending colon and sigmoid colon. Overall, the degree of colonic distension has decreased compared to the prior study.  No definite pathologic distension of small bowel. Air-fluid levels are noted within the colon left lateral decubitus view. No definite pneumoperitoneum.  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  IMPRESSION: 1.  Nonspecific bowel gas pattern with persistent chronic dilatation of the colon, similar to numerous prior examinations. No signs of small bowel obstruction at this time.  Original Report Authenticated By: Trudie Reed, M.D.    Dg Abd Acute W/chest  02/11/2013  *RADIOLOGY REPORT*  Clinical Data: Abdominal pain and distention.  Chronic constipation.  ACUTE ABDOMEN SERIES (ABDOMEN 2 VIEW & CHEST 1 VIEW)  Comparison: Chest and two views abdomen 07/29/2012 and 12/23/2012.  Findings: Single view of the chest demonstrates clear lungs.  Heart size is normal.  No pneumothorax or pleural effusion.  Two views of the abdomen show no free intraperitoneal air.  As seen on the prior studies, there is marked gaseous distention of the colon diffusely.  No evidence of small bowel obstruction is identified.  Small to moderate volume of stool is seen in the cecum and ascending colon.  IMPRESSION:  1.  No acute finding. 2.  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   Original Report  Authenticated By: Holley Dexter, M.D.     Microbiology: Recent Results (from the past 240 hour(s))  URINE CULTURE     Status: None   Collection Time    02/12/13  5:39 AM      Result Value Range Status   Specimen Description URINE, RANDOM   Final   Special Requests NONE   Final   Culture  Setup Time 02/12/2013 09:45   Final   Colony Count 30,000 COLONIES/ML   Final   Culture     Final   Value: Multiple bacterial morphotypes present, none predominant. Suggest appropriate recollection if clinically indicated.   Report Status 02/13/2013 FINAL   Final     Labs: Basic Metabolic Panel:  Recent Labs Lab 02/11/13 1620 02/12/13 0352 02/13/13 0350  NA 142 137 139  K 3.2* 5.0 3.6  CL 104 106 106  CO2 26 25 27   GLUCOSE 96 78 81  BUN 8 6 4*  CREATININE 0.50 0.48* 0.49*  CALCIUM 9.1 8.1* 8.1*   Liver Function Tests:  Recent Labs Lab 02/11/13 1620  AST 22  ALT 16  ALKPHOS 76  BILITOT 0.5  PROT 7.2  ALBUMIN 3.5    Recent Labs Lab 02/11/13 1620  LIPASE 22   No results found for this basename: AMMONIA,  in the last 168 hours CBC:  Recent Labs Lab 02/11/13 1620  WBC 8.5  NEUTROABS 5.3  HGB 12.3  HCT 37.1  MCV 87.9  PLT 317   Cardiac Enzymes: No results found for this basename: CKTOTAL, CKMB, CKMBINDEX, TROPONINI,  in the last 168 hours BNP: BNP (last 3 results) No results found for this basename: PROBNP,  in the last 8760 hours CBG:  Recent Labs Lab 02/13/13 0753 02/13/13 1141 02/13/13 1633 02/13/13 2136 02/14/13 0741  GLUCAP 76 95 120* 103* 100*       Signed:  Chi Garlow C  [facility] 02/14/2013, 11:53 AM

## 2013-04-02 ENCOUNTER — Inpatient Hospital Stay (HOSPITAL_COMMUNITY)
Admission: EM | Admit: 2013-04-02 | Discharge: 2013-04-05 | DRG: 392 | Disposition: A | Discharge status: Home / Self Care | Payer: Medicare Other | Attending: Internal Medicine | Admitting: Internal Medicine

## 2013-04-02 ENCOUNTER — Emergency Department (HOSPITAL_COMMUNITY): Payer: Medicare Other

## 2013-04-02 ENCOUNTER — Encounter (HOSPITAL_COMMUNITY): Payer: Self-pay | Admitting: Emergency Medicine

## 2013-04-02 DIAGNOSIS — K567 Ileus, unspecified: Secondary | ICD-10-CM

## 2013-04-02 DIAGNOSIS — Z91199 Patient's noncompliance with other medical treatment and regimen due to unspecified reason: Secondary | ICD-10-CM

## 2013-04-02 DIAGNOSIS — D649 Anemia, unspecified: Secondary | ICD-10-CM | POA: Diagnosis present

## 2013-04-02 DIAGNOSIS — N390 Urinary tract infection, site not specified: Secondary | ICD-10-CM

## 2013-04-02 DIAGNOSIS — Z79899 Other long term (current) drug therapy: Secondary | ICD-10-CM

## 2013-04-02 DIAGNOSIS — I10 Essential (primary) hypertension: Secondary | ICD-10-CM

## 2013-04-02 DIAGNOSIS — K6389 Other specified diseases of intestine: Secondary | ICD-10-CM | POA: Diagnosis present

## 2013-04-02 DIAGNOSIS — E119 Type 2 diabetes mellitus without complications: Secondary | ICD-10-CM

## 2013-04-02 DIAGNOSIS — Z794 Long term (current) use of insulin: Secondary | ICD-10-CM

## 2013-04-02 DIAGNOSIS — K5900 Constipation, unspecified: Secondary | ICD-10-CM

## 2013-04-02 DIAGNOSIS — G9340 Encephalopathy, unspecified: Secondary | ICD-10-CM

## 2013-04-02 DIAGNOSIS — K5909 Other constipation: Secondary | ICD-10-CM

## 2013-04-02 DIAGNOSIS — R451 Restlessness and agitation: Secondary | ICD-10-CM

## 2013-04-02 DIAGNOSIS — F79 Unspecified intellectual disabilities: Secondary | ICD-10-CM

## 2013-04-02 DIAGNOSIS — Z8673 Personal history of transient ischemic attack (TIA), and cerebral infarction without residual deficits: Secondary | ICD-10-CM

## 2013-04-02 DIAGNOSIS — K599 Functional intestinal disorder, unspecified: Secondary | ICD-10-CM

## 2013-04-02 DIAGNOSIS — F72 Severe intellectual disabilities: Secondary | ICD-10-CM | POA: Diagnosis present

## 2013-04-02 DIAGNOSIS — Z9119 Patient's noncompliance with other medical treatment and regimen: Secondary | ICD-10-CM

## 2013-04-02 DIAGNOSIS — E876 Hypokalemia: Secondary | ICD-10-CM

## 2013-04-02 DIAGNOSIS — R109 Unspecified abdominal pain: Secondary | ICD-10-CM

## 2013-04-02 LAB — URINE MICROSCOPIC-ADD ON

## 2013-04-02 LAB — CBC WITH DIFFERENTIAL/PLATELET
Basophils Absolute: 0 10*3/uL (ref 0.0–0.1)
Basophils Relative: 0 % (ref 0–1)
Eosinophils Absolute: 0 10*3/uL (ref 0.0–0.7)
Eosinophils Relative: 0 % (ref 0–5)
MCH: 30 pg (ref 26.0–34.0)
MCHC: 34.3 g/dL (ref 30.0–36.0)
MCV: 87.6 fL (ref 78.0–100.0)
Platelets: 219 10*3/uL (ref 150–400)
RDW: 14.4 % (ref 11.5–15.5)
WBC: 6.2 10*3/uL (ref 4.0–10.5)

## 2013-04-02 LAB — BASIC METABOLIC PANEL
BUN: 7 mg/dL (ref 6–23)
Calcium: 10.7 mg/dL — ABNORMAL HIGH (ref 8.4–10.5)
Creatinine, Ser: 0.47 mg/dL — ABNORMAL LOW (ref 0.50–1.10)
GFR calc Af Amer: >90 mL/min (ref >90)

## 2013-04-02 LAB — URINALYSIS, ROUTINE W REFLEX MICROSCOPIC
Bilirubin Urine: NEGATIVE
Ketones, ur: NEGATIVE mg/dL
Nitrite: NEGATIVE
Protein, ur: 30 mg/dL — AB

## 2013-04-02 LAB — OCCULT BLOOD, POC DEVICE: Fecal Occult Bld: NEGATIVE

## 2013-04-02 MED ORDER — ONDANSETRON HCL 4 MG PO TABS: 4.0000 mg | ORAL_TABLET | Freq: Four times a day (QID) | ORAL | Status: DC | PRN

## 2013-04-02 MED ORDER — AMITRIPTYLINE HCL 25 MG PO TABS
25.0000 mg | ORAL_TABLET | Freq: Every day | ORAL | Status: DC
  Administered 2013-04-02 – 2013-04-04 (×3): 25 mg via ORAL
  Filled 2013-04-02 (×4): qty 1

## 2013-04-02 MED ORDER — LISINOPRIL 40 MG PO TABS
40.0000 mg | ORAL_TABLET | Freq: Every day | ORAL | Status: DC
  Administered 2013-04-03 – 2013-04-05 (×3): 40 mg via ORAL
  Filled 2013-04-02 (×3): qty 1

## 2013-04-02 MED ORDER — SODIUM CHLORIDE 0.9 % IV BOLUS (SEPSIS): 500.0000 mL | Freq: Once | INTRAVENOUS | Status: DC

## 2013-04-02 MED ORDER — MORPHINE SULFATE 4 MG/ML IJ SOLN
2.0000 mg | Freq: Once | INTRAMUSCULAR | Status: AC
  Administered 2013-04-02: 2 mg via INTRAVENOUS
  Filled 2013-04-02: qty 1

## 2013-04-02 MED ORDER — ACETAMINOPHEN 650 MG RE SUPP: 650.0000 mg | Freq: Four times a day (QID) | RECTAL | Status: DC | PRN

## 2013-04-02 MED ORDER — MILK AND MOLASSES ENEMA
Freq: Once | RECTAL | Status: DC
  Filled 2013-04-02: qty 250

## 2013-04-02 MED ORDER — DOCUSATE SODIUM 100 MG PO CAPS
100.0000 mg | ORAL_CAPSULE | Freq: Two times a day (BID) | ORAL | Status: DC
  Administered 2013-04-02 – 2013-04-05 (×6): 100 mg via ORAL
  Filled 2013-04-02 (×7): qty 1

## 2013-04-02 MED ORDER — CARVEDILOL 25 MG PO TABS
25.0000 mg | ORAL_TABLET | Freq: Two times a day (BID) | ORAL | Status: DC
  Administered 2013-04-02: 25 mg via ORAL
  Filled 2013-04-02 (×2): qty 1

## 2013-04-02 MED ORDER — METFORMIN HCL 500 MG PO TABS
500.0000 mg | ORAL_TABLET | Freq: Two times a day (BID) | ORAL | Status: DC
Start: 2013-04-03 — End: 2013-04-05
  Administered 2013-04-03 – 2013-04-05 (×5): 500 mg via ORAL
  Filled 2013-04-02 (×7): qty 1

## 2013-04-02 MED ORDER — CARVEDILOL 25 MG PO TABS
25.0000 mg | ORAL_TABLET | Freq: Two times a day (BID) | ORAL | Status: DC
  Administered 2013-04-03 – 2013-04-05 (×5): 25 mg via ORAL
  Filled 2013-04-02 (×7): qty 1

## 2013-04-02 MED ORDER — LACTULOSE 10 GM/15ML PO SOLN
20.0000 g | Freq: Every day | ORAL | Status: DC
  Administered 2013-04-03 – 2013-04-05 (×3): 20 g via ORAL
  Filled 2013-04-02 (×3): qty 30

## 2013-04-02 MED ORDER — CARVEDILOL 25 MG PO TABS: 25.0000 mg | ORAL_TABLET | Freq: Two times a day (BID) | ORAL | Status: DC

## 2013-04-02 MED ORDER — SODIUM CHLORIDE 0.9 % IV SOLN
INTRAVENOUS | Status: DC
  Administered 2013-04-02: 23:00:00 via INTRAVENOUS

## 2013-04-02 MED ORDER — INSULIN ASPART 100 UNIT/ML ~~LOC~~ SOLN: 3.0000 [IU] | Freq: Three times a day (TID) | SUBCUTANEOUS | Status: DC

## 2013-04-02 MED ORDER — ENOXAPARIN SODIUM 40 MG/0.4ML ~~LOC~~ SOLN: 40.0000 mg | SUBCUTANEOUS | Status: DC

## 2013-04-02 MED ORDER — ADULT MULTIVITAMIN W/MINERALS CH
1.0000 | ORAL_TABLET | Freq: Every day | ORAL | Status: DC
  Administered 2013-04-03 – 2013-04-05 (×3): 1 via ORAL
  Filled 2013-04-02 (×3): qty 1

## 2013-04-02 MED ORDER — SENNA 8.6 MG PO TABS
1.0000 | ORAL_TABLET | Freq: Two times a day (BID) | ORAL | Status: DC
  Administered 2013-04-02 – 2013-04-05 (×6): 8.6 mg via ORAL
  Filled 2013-04-02 (×6): qty 1

## 2013-04-02 MED ORDER — ENOXAPARIN SODIUM 30 MG/0.3ML ~~LOC~~ SOLN
30.0000 mg | Freq: Every day | SUBCUTANEOUS | Status: DC
  Administered 2013-04-02 – 2013-04-04 (×3): 30 mg via SUBCUTANEOUS
  Filled 2013-04-02 (×4): qty 0.3

## 2013-04-02 MED ORDER — OXYCODONE HCL 5 MG PO TABS
5.0000 mg | ORAL_TABLET | ORAL | Status: DC | PRN
  Administered 2013-04-03: 5 mg via ORAL
  Filled 2013-04-02 (×2): qty 1

## 2013-04-02 MED ORDER — METOCLOPRAMIDE HCL 5 MG PO TABS
5.0000 mg | ORAL_TABLET | Freq: Three times a day (TID) | ORAL | Status: DC
  Administered 2013-04-03 – 2013-04-05 (×9): 5 mg via ORAL
  Filled 2013-04-02 (×13): qty 1

## 2013-04-02 MED ORDER — POLYETHYLENE GLYCOL 3350 17 G PO PACK
17.0000 g | PACK | Freq: Every day | ORAL | Status: DC
  Administered 2013-04-03 – 2013-04-05 (×3): 17 g via ORAL
  Filled 2013-04-02 (×3): qty 1

## 2013-04-02 MED ORDER — ONDANSETRON HCL 4 MG/2ML IJ SOLN: 4.0000 mg | Freq: Four times a day (QID) | INTRAMUSCULAR | Status: DC | PRN

## 2013-04-02 MED ORDER — INSULIN ASPART 100 UNIT/ML ~~LOC~~ SOLN: 0.0000 [IU] | Freq: Three times a day (TID) | SUBCUTANEOUS | Status: DC

## 2013-04-02 MED ORDER — ACETAMINOPHEN 325 MG PO TABS: 650.0000 mg | ORAL_TABLET | Freq: Four times a day (QID) | ORAL | Status: DC | PRN

## 2013-04-02 NOTE — ED Notes (Signed)
Lab called to notify platelet count is 219, P clumps noted on smear.

## 2013-04-02 NOTE — ED Notes (Signed)
Patient with a dysfunctional colon whose sister reports recent constipation and unable to pass gas.  She is also hypertensive and tachycardic,  sister has been unable to give her medications because she has gout in her hand.  Patient has mental status of a child due to a stroke in childhood.  Patient's abdomen distended, with decreased appetite.

## 2013-04-02 NOTE — ED Provider Notes (Signed)
Medical screening examination/treatment/procedure(s) were performed by non-physician practitioner and as supervising physician I was immediately available for consultation/collaboration.  Loren Racer, MD 04/02/13 2150

## 2013-04-02 NOTE — H&P (Signed)
[facility]          History and Physical    PCP:   Paulino Rily, MD   Chief Complaint:  Constipation, abdominal distention, nausea  HPI: Patient is a 62 year old woman with repeated admissions for above complaints. She has a history of severe mental retardation following a stroke in her childhood. Her caregiver is her sister who was present at bedside and provides all the information. She states that for the past 3 days she has not been able to have a bowel movement. She has noted that her abdomen has become increasingly more distended than usual. This morning she found her retching in the bathroom. She knows that when this happens she needs to come into the hospital. They usually admit her for one to 2 days, place a rectal tube for decompression and give her enemas and usually this resolves. Acute abdominal film done in the emergency department today shows chronic severe colonic distention, however no evidence for obstruction. We have been asked to admit her for further evaluation and management.  Allergies:  No Known Allergies    Past Medical History  Diagnosis Date  . Hypertension   . Stroke   . Diabetes mellitus   . Chronic constipation     Past Surgical History  Procedure Laterality Date  . No past surgeries      Prior to Admission medications   Medication Sig Start Date End Date Taking? Authorizing Provider  amitriptyline (ELAVIL) 25 MG tablet Take 25 mg by mouth at bedtime.   Yes Historical Provider, MD  carvedilol (COREG) 25 MG tablet Take 25 mg by mouth 2 (two) times daily with a meal.   Yes Historical Provider, MD  HYDROcodone-acetaminophen (NORCO/VICODIN) 5-325 MG per tablet Take 1 tablet by mouth every 6 (six) hours as needed for pain. 02/14/13  Yes Adeline C Viyuoh, MD  IRON PO Take 1 tablet by mouth daily.   Yes Historical Provider, MD  lactulose (CHRONULAC) 10 GM/15ML solution Take 20 g by mouth daily. 02/14/13  Yes Adeline C Viyuoh, MD   lisinopril (PRINIVIL,ZESTRIL) 40 MG tablet Take 40 mg by mouth daily.   Yes Historical Provider, MD  metFORMIN (GLUCOPHAGE) 500 MG tablet Take 500 mg by mouth 2 (two) times daily with a meal.   Yes Historical Provider, MD  metoCLOPramide (REGLAN) 5 MG tablet Take 1 tablet (5 mg total) by mouth 4 (four) times daily -  before meals and at bedtime. 02/14/13  Yes [name], MD  Multiple Vitamins-Minerals (MULTIVITAMINS THER. W/MINERALS) TABS Take 1 tablet by mouth daily.     Yes Historical Provider, MD  polyethylene glycol (MIRALAX / GLYCOLAX) packet Take 17 g by mouth daily. 11/21/12  Yes Standley Brooking, MD  carvedilol (COREG) 25 MG tablet Take 25 mg by mouth daily. 02/28/12 02/27/13  [place] Cyndie Mull, MD    Social History:  reports that she has never smoked. She has never used smokeless tobacco. She reports that she does not drink alcohol or use illicit drugs.  Family History  Problem Relation Age of Onset  . Diabetes Mother     Review of Systems:  Unable to obtain given her mental retardation.  Physical Exam: Blood pressure 178/89, pulse 104, temperature 98.3 F (36.8 C), temperature source Oral, resp. rate 18, SpO2 96.00%. Gen: Awake, says she hurts but cannot specify where. HEENT: Normocephalic, atraumatic, pupils equal round and reactive to light, intact extraocular movements, somewhat dry mucous membranes. Neck: Supple, no JVD, no lymphadenopathy, no  bruits, no goiter. Cardiovascular: Regular rate and rhythm , no murmurs, rubs or gallops. Lungs: Clear to auscultation bilaterally. Abdomen: Markedly distended, hypoactive bowel sounds, no masses or organomegaly noted. Extremities: No clubbing, cyanosis or edema, positive pedal pulses. Neurologic: Moves all 4 spontaneously, nonfocal.  Labs on Admission:  Results for orders placed during the hospital encounter of 04/02/13 (from the past 48 hour(s))  GLUCOSE, CAPILLARY     Status: Abnormal   Collection Time    04/02/13  5:34  PM      Result Value Range   Glucose-Capillary 124 (*) 70 - 99 mg/dL   Comment 1 Notify RN    URINALYSIS, ROUTINE W REFLEX MICROSCOPIC     Status: Abnormal   Collection Time    04/02/13  5:42 PM      Result Value Range   Color, Urine YELLOW  YELLOW   APPearance CLOUDY (*) CLEAR   Specific Gravity, Urine 1.021  1.005 - 1.030   pH 7.0  5.0 - 8.0   Glucose, UA NEGATIVE  NEGATIVE mg/dL   Hgb urine dipstick NEGATIVE  NEGATIVE   Bilirubin Urine NEGATIVE  NEGATIVE   Ketones, ur NEGATIVE  NEGATIVE mg/dL   Protein, ur 30 (*) NEGATIVE mg/dL   Urobilinogen, UA 1.0  0.0 - 1.0 mg/dL   Nitrite NEGATIVE  NEGATIVE   Leukocytes, UA SMALL (*) NEGATIVE  URINE MICROSCOPIC-ADD ON     Status: Abnormal   Collection Time    04/02/13  5:42 PM      Result Value Range   Squamous Epithelial / LPF RARE  RARE   WBC, UA 3-6  <3 WBC/hpf   Bacteria, UA RARE  RARE   Casts HYALINE CASTS (*) NEGATIVE  CBC WITH DIFFERENTIAL     Status: Abnormal   Collection Time    04/02/13  6:33 PM      Result Value Range   WBC 6.2  4.0 - 10.5 K/uL   RBC 5.23 (*) 3.87 - 5.11 MIL/uL   Hemoglobin 15.7 (*) 12.0 - 15.0 g/dL   HCT 16.1  09.6 - 04.5 %   MCV 87.6  78.0 - 100.0 fL   MCH 30.0  26.0 - 34.0 pg   MCHC 34.3  30.0 - 36.0 g/dL   RDW 40.9  81.1 - 91.4 %   Platelets 219  150 - 400 K/uL   Comment: PLATELET CLUMPS NOTED ON SMEAR     SPECIMEN CHECKED FOR CLOTS     CALLED A KHALID RN 2004 04/02/13 A NAVARRO     CORRECTED ON 04/24 AT 2000: PREVIOUSLY REPORTED AS PLATELET CLUMPS NOTED ON SMEAR, COUNT APPEARS ADEQUATE   Neutrophils Relative 62  43 - 77 %   Neutro Abs 3.8  1.7 - 7.7 K/uL   Lymphocytes Relative 33  12 - 46 %   Lymphs Abs 2.0  0.7 - 4.0 K/uL   Monocytes Relative 5  3 - 12 %   Monocytes Absolute 0.3  0.1 - 1.0 K/uL   Eosinophils Relative 0  0 - 5 %   Eosinophils Absolute 0.0  0.0 - 0.7 K/uL   Basophils Relative 0  0 - 1 %   Basophils Absolute 0.0  0.0 - 0.1 K/uL   Smear Review MORPHOLOGY UNREMARKABLE     BASIC METABOLIC PANEL     Status: Abnormal   Collection Time    04/02/13  6:33 PM      Result Value Range   Sodium 135  135 - 145 mEq/L  Potassium 5.1  3.5 - 5.1 mEq/L   Comment: HEMOLYZED SPECIMEN, RESULTS MAY BE AFFECTED     HEMOLYSIS AT THIS LEVEL MAY AFFECT RESULT   Chloride 97  96 - 112 mEq/L   CO2 25  19 - 32 mEq/L   Glucose, Bld 109 (*) 70 - 99 mg/dL   BUN 7  6 - 23 mg/dL   Creatinine, Ser 1.61 (*) 0.50 - 1.10 mg/dL   Calcium 09.6 (*) 8.4 - 10.5 mg/dL   GFR calc non Af Amer >90  >90 mL/min   GFR calc Af Amer >90  >90 mL/min   Comment:            The eGFR has been calculated     using the CKD EPI equation.     This calculation has not been     validated in all clinical     situations.     eGFR's persistently     <90 mL/min signify     possible Chronic Kidney Disease.  OCCULT BLOOD, POC DEVICE     Status: None   Collection Time    04/02/13  8:12 PM      Result Value Range   Fecal Occult Bld NEGATIVE  NEGATIVE    Radiological Exams on Admission: Dg Abd Acute W/chest  04/02/2013  *RADIOLOGY REPORT*  Clinical Data: Abdominal distention and pain.  ACUTE ABDOMEN SERIES (ABDOMEN 2 VIEW & CHEST 1 VIEW)  Comparison: 02/13/2013  Findings: The lungs are clear.  Heart size is normal.  The abdominal films show diffuse and chronic gaseous dilatation of the colon which has been demonstrated on numerous prior imaging studies.  There is no evidence of free intraperitoneal air or associated dilatation of the small bowel.  No abnormal calcifications are seen.  IMPRESSION: Chronic diffuse dilatation of the colon.  No evidence of free intraperitoneal air.   Original Report Authenticated By: Irish Lack, M.D.     Assessment/Plan Principal Problem:   Obstipation Active Problems:   DM type 2 (diabetes mellitus, type 2)   HTN (hypertension)   Constipation, chronic   Colonic inertia   Mental retardation    Obstipation/chronic constipation/colonic inertia -She has had repeated  admissions for this issue. -Usually requires a rectal tube for decompression which I will order. -Sister relates good success with soapsuds enema in the past, so we'll order one tonight. -We'll continue MiraLAX, lactulose. We'll also add Colace and Senokot.  Hypertension -Blood pressure was quite elevated on admission. Sister relates that she has refused to take her medications for the past 3 days. -She has been given her home doses in the emergency room and her blood pressure has already decreased significantly. -We'll continue her home blood pressure regimen.  Type 2 diabetes mellitus -Continue metformin as do not anticipate any contrast requiring imaging this admission. -Start sliding scale insulin.  DVT prophylaxis -Lovenox.   Time Spent on Admission: 70 minutes  HERNANDEZ ACOSTA,ESTELA [facility] Pager: [phone] 04/02/2013, 10:07 PM

## 2013-04-02 NOTE — ED Provider Notes (Signed)
History     CSN: [id number]  Arrival date & time 04/02/13  1616   First MD Initiated Contact with Patient 04/02/13 1655      Chief Complaint  Patient presents with  . Hypertension    (Consider location/radiation/quality/duration/timing/severity/associated sxs/prior treatment) The history is provided by medical records and a caregiver. No language interpreter was used.    [name] is a 62 y.o. female  with a hx of CVA, colonic, HTN, DM presents to the Emergency Department complaining of gradual, persistent, progressively worsening and recurrent abd distention and pain onset 3 days ago with accompanying vomiting beginning last night. Sister also complains that pt is not taking her medications x3 days.  Associated symptoms include nasuea, vomiting, cessation of BM (last on Monday after enema, but it was smaller than normal).  Nothing makes it better and nothing makes it worse.  Pt sister denies fever, chills, blood in urine or feces.  Level 5 caveat for mental retardation.  HPI from pt's sister.  Pt sister also endorses slow decline in mental status over the last 2 months, no acute changes today.  On record review pt has had several admissions for the same and her caregiver states this is what needs to happen today as they have tried soapsuds enema without relief.  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     Past Medical History  Diagnosis Date  . Hypertension   . Stroke   . Diabetes mellitus   . Chronic constipation     Past Surgical History  Procedure Laterality Date  . No past surgeries      Family History  Problem Relation Age of Onset  . Diabetes Mother     History  Substance Use Topics  . Smoking status: Never Smoker   . Smokeless tobacco: Never Used  . Alcohol Use: No     Comment: occasional wine    OB History   Grav Para Term Preterm Abortions TAB SAB Ect Mult Living                   Review of Systems  Unable to perform ROS: Psychiatric disorder  Gastrointestinal: Positive for nausea, vomiting and abdominal pain.    Allergies  Review of patient's allergies indicates no known allergies.  Home Medications   Current Outpatient Rx  Name  Route  Sig  Dispense  Refill  . amitriptyline (ELAVIL) 25 MG tablet   Oral   Take 25 mg by mouth at bedtime.         . carvedilol (COREG) 25 MG tablet   Oral   Take 25 mg by mouth 2 (two) times daily with a meal.         . HYDROcodone-acetaminophen (NORCO/VICODIN) 5-325 MG per tablet   Oral   Take 1 tablet by mouth every 6 (six) hours as needed for pain.         . IRON PO   Oral   Take 1 tablet by mouth daily.         Marland Kitchen lactulose (CHRONULAC) 10 GM/15ML solution   Oral   Take 20 g by mouth daily.         Marland Kitchen lisinopril (PRINIVIL,ZESTRIL) 40 MG tablet   Oral   Take 40 mg by mouth daily.         . metFORMIN (GLUCOPHAGE) 500 MG tablet   Oral  Take 500 mg by mouth 2 (two) times daily with a meal.         . metoCLOPramide (REGLAN) 5 MG tablet   Oral   Take 1 tablet (5 mg total) by mouth 4 (four) times daily -  before meals and at bedtime.   120 tablet   0   . Multiple Vitamins-Minerals (MULTIVITAMINS THER. W/MINERALS) TABS   Oral   Take 1 tablet by mouth daily.           . polyethylene glycol (MIRALAX / GLYCOLAX) packet   Oral   Take 17 g by mouth daily.         Marland Kitchen EXPIRED: carvedilol (COREG) 25 MG tablet   Oral   Take 25 mg by mouth daily.           BP 178/89  Pulse 104  Temp(Src) 98.3 F (36.8 C) (Oral)  Resp 18  SpO2 96%  Physical Exam  Nursing note and vitals reviewed. Constitutional: She appears well-developed and well-nourished.  HENT:  Head: Normocephalic and atraumatic.  Mouth/Throat: Oropharynx is clear and moist.  Eyes: Conjunctivae are normal. No scleral icterus.  Cardiovascular: Regular rhythm, normal heart sounds and intact distal pulses.  Tachycardia  present.   Pulses:      Radial pulses are 2+ on the right side, and 2+ on the left side.       Dorsalis pedis pulses are 2+ on the right side, and 2+ on the left side.       Posterior tibial pulses are 2+ on the right side, and 2+ on the left side.  Pulmonary/Chest: Effort normal and breath sounds normal.  Abdominal: Soft. Normal appearance and bowel sounds are normal. She exhibits distension. She exhibits no mass. There is generalized tenderness. There is no rigidity, no rebound, no guarding and no CVA tenderness.  Neurological: She is alert.  Skin: Skin is warm and dry.  Psychiatric: She has a normal mood and affect.    ED Course  Procedures (including critical care time)  Labs Reviewed  CBC WITH DIFFERENTIAL - Abnormal; Notable for the following:    RBC 5.23 (*)    Hemoglobin 15.7 (*)    All other components within normal limits  BASIC METABOLIC PANEL - Abnormal; Notable for the following:    Glucose, Bld 109 (*)    Creatinine, Ser 0.47 (*)    Calcium 10.7 (*)    All other components within normal limits  URINALYSIS, ROUTINE W REFLEX MICROSCOPIC - Abnormal; Notable for the following:    APPearance CLOUDY (*)    Protein, ur 30 (*)    Leukocytes, UA SMALL (*)    All other components within normal limits  GLUCOSE, CAPILLARY - Abnormal; Notable for the following:    Glucose-Capillary 124 (*)    All other components within normal limits  URINE MICROSCOPIC-ADD ON - Abnormal; Notable for the following:    Casts HYALINE CASTS (*)    All other components within normal limits  OCCULT BLOOD X 1 CARD TO LAB, STOOL  OCCULT BLOOD, POC DEVICE   Dg Abd Acute W/chest  04/02/2013  *RADIOLOGY REPORT*  Clinical Data: Abdominal distention and pain.  ACUTE ABDOMEN SERIES (ABDOMEN 2 VIEW & CHEST 1 VIEW)  Comparison: 02/13/2013  Findings: The lungs are clear.  Heart size is normal.  The abdominal films show diffuse and chronic gaseous dilatation of the colon which has been demonstrated on numerous  prior imaging studies.  There is no evidence of free intraperitoneal  air or associated dilatation of the small bowel.  No abnormal calcifications are seen.  IMPRESSION: Chronic diffuse dilatation of the colon.  No evidence of free intraperitoneal air.   Original Report Authenticated By: Irish Lack, M.D.     ECG:  Date: 04/02/2013  Rate: 124  Rhythm: sinus tachycardia  QRS Axis: normal  Intervals: normal  ST/T Wave abnormalities: normal  Conduction Disutrbances:none  Narrative Interpretation: nonischemic, sinus tach, unchanged from previous  Old EKG Reviewed: unchanged   1. Colonic inertia   2. Constipation   3. Abdominal  pain, other specified site   4. Mental retardation   5. DM (diabetes mellitus)       MDM  [name] presents with chronic constipation and a diagnosis of colonic inertia.  Caregiver causing admission. Acute abdominal series without evidence of bowel obstruction or pneumonia.  I personally reviewed the imaging tests through PACS system.  I reviewed available ER/hospitalization records through the EMR.  Patient was recently admitted for the same. Fecal occult blood negative and a large amount of soft stool on rectal exam, no impaction felt. CBC, BMP unremarkable, UA without evidence of urinary tract infection.  Patient initially hypertensive urgency with blood pressure of 234/132 however that has decreased with the patient's home blood pressure medications to 178/89. Patient without complaints of chest pain or shortness of breath however system and limited by exam as patient will not interact with medical provider.  Discussed with hospitalist to supple the patient meets admission criteria. She will evaluate the patient and discussed the case with the patient's caregiver.    Pt will be admitted to the hospitalist for bowel clean out and observation.  Dr. Ranae Palms was consulted, evaluated this patient with me and agrees with the plan.             Dahlia Client Sonda Coppens, PA-C 04/02/13 2149

## 2013-04-03 DIAGNOSIS — E876 Hypokalemia: Secondary | ICD-10-CM

## 2013-04-03 LAB — BASIC METABOLIC PANEL
BUN: 6 mg/dL (ref 6–23)
Calcium: 8.7 mg/dL (ref 8.4–10.5)
Creatinine, Ser: 0.47 mg/dL — ABNORMAL LOW (ref 0.50–1.10)
GFR calc non Af Amer: >90 mL/min (ref >90)
Glucose, Bld: 82 mg/dL (ref 70–99)
Sodium: 138 mEq/L (ref 135–145)

## 2013-04-03 LAB — CBC
MCH: 28.7 pg (ref 26.0–34.0)
MCHC: 32.9 g/dL (ref 30.0–36.0)
Platelets: 310 10*3/uL (ref 150–400)
RDW: 14.3 % (ref 11.5–15.5)

## 2013-04-03 LAB — GLUCOSE, CAPILLARY: Glucose-Capillary: 66 mg/dL — ABNORMAL LOW (ref 70–99)

## 2013-04-03 LAB — TSH: TSH: 0.669 u[IU]/mL (ref 0.350–4.500)

## 2013-04-03 MED ORDER — LORAZEPAM 0.5 MG PO TABS
0.5000 mg | ORAL_TABLET | Freq: Two times a day (BID) | ORAL | Status: DC | PRN
  Administered 2013-04-03 – 2013-04-04 (×2): 0.5 mg via ORAL
  Filled 2013-04-03 (×2): qty 1

## 2013-04-03 MED ORDER — MILK AND MOLASSES ENEMA
Freq: Once | RECTAL | Status: AC
  Administered 2013-04-03: 11:00:00 via RECTAL
  Filled 2013-04-03: qty 250

## 2013-04-03 MED ORDER — POTASSIUM CHLORIDE CRYS ER 20 MEQ PO TBCR
20.0000 meq | EXTENDED_RELEASE_TABLET | Freq: Once | ORAL | Status: AC
  Administered 2013-04-03: 20 meq via ORAL
  Filled 2013-04-03: qty 1

## 2013-04-03 MED ORDER — HYDRALAZINE HCL 20 MG/ML IJ SOLN
5.0000 mg | Freq: Four times a day (QID) | INTRAMUSCULAR | Status: DC | PRN
  Administered 2013-04-03: 5 mg via INTRAVENOUS
  Filled 2013-04-03: qty 1

## 2013-04-03 NOTE — Progress Notes (Signed)
Soap suds enema given, pt tolerated well . Good results. Abdomen still firm and distended.

## 2013-04-03 NOTE — Progress Notes (Addendum)
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 Per caregiver/sister, patient usually comes to the hospital when she has the symptoms and has rectal tube placed for decompression, and enemas with eventual resolution. Abdominal x-ray in ED showed chronic severe colonic distention without evidence for obstruction.   Assessment/Plan: 1. Abdominal distention/chronic constipation/colonic inertia: Patient has had frequent admissions for this issue. Had a large BM after soapsuds enema x1 overnight. Per sister, abdominal distention is not significantly changed but patient is not distressed. Continue MiraLAX, lactulose, Colace, Senokot, Reglan and will give a milk and molasses enema. Hold off on rectal tube for now. Monitor. No recent TSH, will check. 2. HTN: Blood pressures elevated on admission secondary to noncompliance for last 3 days. Continue home medications (carvedilol and lisinopril). Improved. 3. Type II DM: Continue metformin and SSI. 4. Hypokalemia: Orally replete and follow BMP in a.m. 5. Anemia: Possibly dilutional. No features of bleeding. Follow up CBC in a.m.  Code Status: Full Family Communication: Discussed with patient's sister/caregiver Ms. Rexene Edison at bedside. Disposition Plan: Home when medically stable.   Consultants:  None  Procedures:  Enema   Antibiotics:  None   HPI/Subjective: Patient denies complaints. Per sister, one large BM after a soapsuds enema overnight. Patient denies abdominal pain, nausea or vomiting.  Objective: Filed Vitals:   04/02/13  2245 04/03/13 0025 04/03/13 0130 04/03/13 0615  BP:  184/96 169/80 127/59  Pulse: 87   79  Temp: 98.8 F (37.1 C)   97.3 F (36.3 C)  TempSrc: Oral   Oral  Resp: 18   18  Weight: 39.8 kg (87 lb 11.9 oz)     SpO2: 96%   100%    Intake/Output Summary (Last 24 hours) at 04/03/13 1310 Last data filed at 04/03/13 0500  Gross per 24 hour  Intake     60 ml  Output      0 ml  Net     60 ml   Filed Weights   04/02/13 2245  Weight: 39.8 kg (87 lb 11.9 oz)    Exam:   General exam: Comfortable.  Respiratory system: Clear. No increased work of breathing.  Cardiovascular system: S1 & S2 heard, RRR. No JVD, murmurs, gallops, clicks or pedal edema.  Gastrointestinal system: Abdomen is markedly distended but soft and not tense, and nontender. Hyperactive bowel sounds heard.  Central nervous system: Alert and oriented only to self. No focal neurological deficits.  Extremities: Symmetric 5 x 5 power.   Data Reviewed: Basic Metabolic Panel:  Recent Labs Lab 04/02/13 1833 04/03/13 0430  NA 135 138  K 5.1 3.4*  CL 97 104  CO2 25 27  GLUCOSE 109* 82  BUN 7 6  CREATININE 0.47* 0.47*  CALCIUM 10.7* 8.7   Liver Function Tests: No results found for this basename: AST, ALT, ALKPHOS, BILITOT, PROT, ALBUMIN,  in the last 168 hours No results found for this basename: LIPASE, AMYLASE,  in the last 168 hours No results found for this basename: AMMONIA,  in the last 168 hours CBC:  Recent Labs Lab 04/02/13 1833 04/03/13 0430  WBC 6.2 8.3  NEUTROABS 3.8  --   HGB 15.7* 11.2*  HCT 45.8 34.0*  MCV 87.6 87.2  PLT 219 310   Cardiac Enzymes: No results found for this basename: CKTOTAL, CKMB, CKMBINDEX, TROPONINI,  in the last 168 hours BNP (last 3 results) No results found for this basename: PROBNP,  in the last 8760 hours CBG:  Recent Labs Lab 04/02/13 1734 04/03/13 0733  GLUCAP 124* 74    No results found for this or any previous visit (from the past 240 hour(s)).    Studies: Dg Abd Acute W/chest  04/02/2013  *RADIOLOGY REPORT*  Clinical Data: Abdominal distention and pain.  ACUTE ABDOMEN SERIES (ABDOMEN 2 VIEW & CHEST 1 VIEW)  Comparison: 02/13/2013  Findings: The lungs are clear.  Heart size is normal.  The abdominal films show diffuse and chronic gaseous dilatation of the colon which has been demonstrated on numerous prior imaging studies.  There is no evidence of free intraperitoneal air or associated dilatation of the small bowel.  No abnormal calcifications are seen.  IMPRESSION: Chronic diffuse dilatation of the colon.  No evidence of free intraperitoneal air.   Original Report Authenticated By: Irish Lack, M.D.      Additional labs:   Scheduled Meds: . amitriptyline  25 mg Oral QHS  . carvedilol  25 mg Oral BID WC  . docusate sodium  100 mg Oral BID  . enoxaparin (LOVENOX) injection  30 mg Subcutaneous QHS  . insulin aspart  0-9 Units Subcutaneous TID WC  . insulin aspart  3 Units Subcutaneous TID WC  . lactulose  20 g Oral Daily  . lisinopril  40 mg Oral Daily  . metFORMIN  500 mg Oral BID WC  . metoCLOPramide  5 mg Oral TID AC & HS  . multivitamin with minerals  1 tablet Oral Daily  . polyethylene glycol  17 g Oral Daily  . senna  1 tablet Oral BID   Continuous Infusions: . sodium chloride 75 mL/hr at 04/02/13 2310    Principal Problem:   Obstipation Active Problems:   DM type 2 (diabetes mellitus, type 2)   HTN (hypertension)   Constipation, chronic   Colonic inertia   Mental retardation    Time spent: 30 minutes.    [facility]  [facility] Pager [phone].   If 8PM-8AM, please contact night-coverage at [email or web address], password [facility] 04/03/2013, 1:10 PM  LOS: 1 day

## 2013-04-03 NOTE — Progress Notes (Signed)
Rx Brief Lovenox note Wt= 42.5 kg CrCl>30 plts=219 Adjust Lovenox to 30mg  daily in pt with wt<45 kg  [name] 04/03/2013 12:14 AM

## 2013-04-03 NOTE — Progress Notes (Signed)
On-call notified of BP, new orders received. Will continue to monitor.

## 2013-04-03 NOTE — Care Management (Signed)
CARE MANAGEMENT NOTE 04/03/2013  Patient:  [name], [name]   Account Number:  [id number]  Date Initiated:  04/03/2013  Documentation initiated by:  Day Greb  Subjective/Objective Assessment:   62 yo female admitted with Abdominal distention/chronic constipation/colonic inertia. PTA pt lived home with adult sister. Hx of mental retardation.     Action/Plan:   Home when stable   Anticipated DC Date:     Anticipated DC Plan:    In-house referral  NA      DC Planning Services  CM consult      Choice offered to / List presented to:     DME arranged  NA      DME agency  NA     HH arranged  NA      HH agency  NA   Status of service:  In process, will continue to follow Medicare Important Message given?   (If response is "NO", the following Medicare IM given date [place] will be blank) Date Medicare IM given:   Date Additional Medicare IM given:    Discharge Disposition:    Per UR Regulation:  Reviewed for med. necessity/level of care/duration of stay  If discussed at Long Length of Stay Meetings, dates discussed:    Comments:  04/03/13 1634 Mellody Masri,RN,BSN [phone] Pt from home with adult sister as primary caretaker.

## 2013-04-04 DIAGNOSIS — IMO0002 Reserved for concepts with insufficient information to code with codable children: Secondary | ICD-10-CM

## 2013-04-04 LAB — BASIC METABOLIC PANEL
BUN: 6 mg/dL (ref 6–23)
CO2: 24 mEq/L (ref 19–32)
Calcium: 8.6 mg/dL (ref 8.4–10.5)
Glucose, Bld: 70 mg/dL (ref 70–99)
Potassium: 3.7 mEq/L (ref 3.5–5.1)
Sodium: 140 mEq/L (ref 135–145)

## 2013-04-04 LAB — CBC
HCT: 32.9 % — ABNORMAL LOW (ref 36.0–46.0)
Hemoglobin: 10.8 g/dL — ABNORMAL LOW (ref 12.0–15.0)
MCH: 29 pg (ref 26.0–34.0)
MCHC: 32.8 g/dL (ref 30.0–36.0)
RBC: 3.72 MIL/uL — ABNORMAL LOW (ref 3.87–5.11)

## 2013-04-04 LAB — GLUCOSE, CAPILLARY
Glucose-Capillary: 67 mg/dL — ABNORMAL LOW (ref 70–99)
Glucose-Capillary: 108 mg/dL — ABNORMAL HIGH (ref 70–99)

## 2013-04-04 MED ORDER — FLEET ENEMA 7-19 GM/118ML RE ENEM
1.0000 | ENEMA | Freq: Once | RECTAL | Status: DC
  Filled 2013-04-04: qty 1

## 2013-04-04 MED ORDER — MILK AND MOLASSES ENEMA
Freq: Once | RECTAL | Status: AC
  Administered 2013-04-04: 21:00:00 via RECTAL
  Filled 2013-04-04: qty 250

## 2013-04-04 NOTE — Progress Notes (Signed)
Patient ID: [name], female   DOB: 1951-01-13, 62 y.o.   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 Per caregiver/sister, patient usually comes to the hospital when she has the symptoms and has rectal tube placed for decompression, and enemas with eventual resolution. Abdominal x-ray in ED showed chronic severe colonic distention without evidence for obstruction.   Assessment/Plan:  1. Abdominal distention/chronic constipation/colonic inertia: Patient has had frequent admissions for this issue. Had a large BM on 4/25 and no additional BM, Will give another enema today. Continue MiraLAX, lactulose, Colace, Senokot, Reglan and will give a milk and molasses enema. Hold off on rectal tube for now.  2. HTN: Blood pressures elevated on admission but reasonable control inpatient.  3. Type II DM: Continue metformin and SSI. 4. Hypokalemia: Within normal limits this AM, BMP in AM 5. Anemia: Possibly dilutional. No features of bleeding. Follow up CBC in a.m.  Code Status: Full  Family Communication: Discussed with patient's sister/caregiver [name] at bedside.  Disposition Plan: Home likely in AM  Consultants:  None Procedures:  Enema  Antibiotics:  None   HPI/Subjective: No events overnight.   Objective: Filed Vitals:   04/03/13 1410 04/03/13 2127 04/04/13 0518 04/04/13 1448  BP: 104/54 113/55 155/74 115/71  Pulse: 72 64 66 79  Temp: 97.9 F (36.6 C) 98.2 F (36.8 C) 97.3 F (36.3 C) 97.4 F (36.3 C)  TempSrc: Oral Axillary Oral Oral   Resp: 18 18 18 18   Weight:      SpO2: 100% 100% 100% 100%    Intake/Output Summary (Last 24 hours) at 04/04/13 2036 Last data filed at 04/04/13 1700  Gross per 24 hour  Intake   1680 ml  Output      0 ml  Net   1680 ml   Exam:   General:  Pt is alert, not in acute distress  Cardiovascular: Regular rate and rhythm, S1/S2, no murmurs, no rubs, no gallops  Respiratory: Clear to auscultation bilaterally, no wheezing, no crackles, no rhonchi  Abdomen: Soft, non tender, non distended, bowel sounds present, no guarding  Extremities: No edema, pulses DP and PT palpable bilaterally  Data Reviewed: Basic Metabolic Panel:  Recent Labs Lab 04/02/13 1833 04/03/13 0430 04/04/13 0458  NA 135 138 140  K 5.1 3.4* 3.7  CL 97 104 107  CO2 25 27 24   GLUCOSE 109* 82 70  BUN 7 6 6   CREATININE 0.47* 0.47* 0.54  CALCIUM 10.7* 8.7 8.6   CBC:  Recent Labs Lab 04/02/13 1833 04/03/13 0430 04/04/13 0458  WBC 6.2 8.3 7.3  NEUTROABS 3.8  --   --   HGB 15.7* 11.2* 10.8*  HCT 45.8 34.0* 32.9*  MCV 87.6 87.2 88.4  PLT 219 310 263   CBG:  Recent Labs Lab 04/03/13 2226 04/04/13 0726 04/04/13 0838 04/04/13 1226 04/04/13 1701  GLUCAP 90 67* 108* 90 98   Scheduled Meds: . amitriptyline  25 mg Oral QHS  . carvedilol  25 mg Oral BID WC  . docusate sodium  100 mg Oral BID  . enoxaparin (LOVENOX) injection  30 mg Subcutaneous QHS  . insulin  aspart  0-9 Units Subcutaneous TID WC  . insulin aspart  3 Units Subcutaneous TID WC  . lactulose  20 g Oral Daily  . lisinopril  40 mg Oral Daily  . metFORMIN  500 mg Oral BID WC  . metoCLOPramide  5 mg Oral TID AC & HS  . milk and molasses   Rectal Once  . multivitamin with minerals  1 tablet Oral Daily  . polyethylene glycol  17 g Oral Daily  . senna  1 tablet Oral BID  . sodium phosphate  1 enema Rectal Once   Continuous Infusions:   Debbora Presto, MD  [facility] Pager [phone]  If 7PM-7AM, please contact  night-coverage [email or web address] Password [facility] 04/04/2013, 8:36 PM   LOS: 2 days

## 2013-04-05 LAB — BASIC METABOLIC PANEL
BUN: 7 mg/dL (ref 6–23)
Calcium: 8.6 mg/dL (ref 8.4–10.5)
Creatinine, Ser: 0.54 mg/dL (ref 0.50–1.10)
GFR calc Af Amer: >90 mL/min (ref >90)
GFR calc non Af Amer: >90 mL/min (ref >90)

## 2013-04-05 LAB — CBC
HCT: 31.4 % — ABNORMAL LOW (ref 36.0–46.0)
MCHC: 33.1 g/dL (ref 30.0–36.0)
MCV: 88.2 fL (ref 78.0–100.0)
Platelets: 257 10*3/uL (ref 150–400)
RDW: 14.3 % (ref 11.5–15.5)

## 2013-04-05 LAB — GLUCOSE, CAPILLARY: Glucose-Capillary: 86 mg/dL (ref 70–99)

## 2013-04-05 MED ORDER — HYDROCODONE-ACETAMINOPHEN 5-325 MG PO TABS: 1.0000 | ORAL_TABLET | Freq: Four times a day (QID) | ORAL | Status: DC | PRN

## 2013-04-05 MED ORDER — SENNA 8.6 MG PO TABS: 1.0000 | ORAL_TABLET | Freq: Two times a day (BID) | ORAL | Status: DC

## 2013-04-05 MED ORDER — DSS 100 MG PO CAPS: 100.0000 mg | ORAL_CAPSULE | Freq: Two times a day (BID) | ORAL | Status: DC | PRN

## 2013-04-05 MED ORDER — LORAZEPAM 0.5 MG PO TABS: 0.5000 mg | ORAL_TABLET | Freq: Two times a day (BID) | ORAL | Status: DC | PRN

## 2013-04-05 NOTE — Discharge Summary (Signed)
Physician Discharge Summary  MARGUERETTE SHELLER ZOX:[id number] DOB: [birth year] DOA: 04/02/2013  PCP: Paulino Rily, MD  Admit date: 04/02/2013 Discharge date: 04/05/2013  Recommendations for Outpatient Follow-up:  1. Pt will need to follow up with PCP in 2-3 weeks post discharge 2. Please obtain BMP to evaluate electrolytes and kidney function, potassium level 3. Please note that pt has received total of 80 MEQ of Kdur prior to discharge so please check potassium level as noted above 4. 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 Per caregiver/sister, patient usually comes to the hospital when she has the symptoms and has rectal tube placed for decompression, and enemas with eventual resolution. Abdominal x-ray in ED showed chronic severe colonic distention without evidence for obstruction.   Assessment/Plan:  1. Abdominal distention/chronic constipation/colonic inertia: Patient has had frequent admissions for this issue. Had a large BM on 4/25 and another BO prior to discharge, Continue MiraLAX, lactulose, Colace, Senokot, Reglan. Pt clinically stable this AM. 2. HTN: Blood pressures elevated on admission but reasonable control inpatient.  3. Type II DM: Continue metformin. SSI provided while inpatient.  4. Hypokalemia: slightly down  from yesterday, will give Kdur 40 MEW x 2 doses prior to discharge so will need to have repeat BMP in 1-2 weeks in PCP office. 5. Anemia: Possibly dilutional. No features of bleeding. Follow up CBC outpatient.   Code Status: Full  Family Communication: Discussed with patient's sister/caregiver Ms. Rexene Edison at bedside.   Consultants:  None Procedures:  Enema  Antibiotics:  None   Discharge Exam: Filed Vitals:   04/05/13 0445  BP: 145/63  Pulse: 58  Temp: 97.1 F (36.2 C)  Resp: 18   Filed Vitals:   04/04/13 1448 04/04/13 2115 04/05/13 0445 04/05/13 0609  BP: 115/71 102/60 145/63   Pulse: 79 73 58   Temp: 97.4 F (36.3 C) 97.5 F (36.4 C) 97.1 F (36.2 C)   TempSrc: Oral Oral Oral   Resp: 18 18 18    Height:    4\' 5"  (1.346 m)  Weight:      SpO2: 100% 100% 100%     General: Pt is alert, follows commands appropriately, not in acute distress Cardiovascular: Regular rate and rhythm, S1/S2 +, no murmurs, no rubs, no gallops Respiratory: Clear to auscultation bilaterally, no wheezing, no crackles, no rhonchi Abdominal: Soft, non tender, non distended, bowel sounds +, no guarding Extremities: no edema, no cyanosis, pulses palpable bilaterally DP and PT Neuro: Grossly nonfocal  Discharge Instructions  Discharge Orders   Future Orders Complete By Expires     Diet - low sodium heart healthy  As directed     Increase activity slowly  As directed         Medication List    TAKE these medications       amitriptyline 25 MG tablet  Commonly known as:  ELAVIL  Take 25 mg by  mouth at bedtime.     carvedilol 25 MG tablet  Commonly known as:  COREG  Take 25 mg by mouth daily.     carvedilol 25 MG tablet  Commonly known as:  COREG  Take 25 mg by mouth 2 (two) times daily with a meal.     DSS 100 MG Caps  Take 100 mg by mouth 2 (two) times daily as needed for constipation.     HYDROcodone-acetaminophen 5-325 MG per tablet  Commonly known as:  NORCO/VICODIN  Take 1  tablet by mouth every 6 (six) hours as needed for pain.     IRON PO  Take 1 tablet by mouth daily.     lactulose 10 GM/15ML solution  Commonly known as:  CHRONULAC  Take 20 g by mouth daily.     lisinopril 40 MG tablet  Commonly known as:  PRINIVIL,ZESTRIL  Take 40 mg by mouth daily.     LORazepam 0.5 MG tablet  Commonly known as:  ATIVAN  Take 1 tablet (0.5 mg total) by mouth 2 (two) times daily as needed for anxiety (agitation.).     metFORMIN 500 MG tablet  Commonly known as:  GLUCOPHAGE  Take 500 mg by mouth 2 (two) times daily with a meal.     metoCLOPramide 5 MG tablet  Commonly known as:  REGLAN  Take 1 tablet (5 mg total) by mouth 4 (four) times daily -  before meals and at bedtime.     multivitamins ther. w/minerals Tabs  Take 1 tablet by mouth daily.     polyethylene glycol packet  Commonly known as:  MIRALAX / GLYCOLAX  Take 17 g by mouth daily.     senna 8.6 MG Tabs  Commonly known as:  SENOKOT  Take 1 tablet (8.6 mg total) by mouth 2 (two) times daily.           Follow-up Information   Follow up with Paulino Rily, MD In 2 weeks.   Contact information:   [address] [place] [address] [phone]        The results of significant diagnostics from this hospitalization (including imaging, microbiology, ancillary and laboratory) are listed below for reference.     Microbiology: No results found for this or any previous visit (from the past 240 hour(s)).   Labs: Basic Metabolic Panel:  Recent Labs Lab 04/02/13 1833 04/03/13 0430 04/04/13 0458 04/05/13 0433  NA 135 138 140 139  K 5.1 3.4* 3.7 2.9*  CL 97 104 107 108  CO2 25 27 24 25   GLUCOSE 109* 82 70 68*  BUN 7 6 6 7   CREATININE 0.47* 0.47* 0.54 0.54  CALCIUM 10.7* 8.7 8.6 8.6   CBC:  Recent Labs Lab 04/02/13 1833 04/03/13 0430 04/04/13 0458 04/05/13 0433  WBC 6.2 8.3 7.3 7.4  NEUTROABS 3.8  --   --   --   HGB 15.7* 11.2* 10.8* 10.4*  HCT 45.8 34.0* 32.9* 31.4*   MCV 87.6 87.2 88.4 88.2  PLT 219 310 263 257   CBG:  Recent Labs Lab 04/04/13 0838 04/04/13 1226 04/04/13 1701 04/04/13 2137 04/05/13 0741  GLUCAP 108* 90 98 94 86     SIGNED: Time coordinating discharge: Over 30 minutes  MAGICK-Gavon Majano, MD  [facility] 04/05/2013, 10:58 AM Pager [phone]  If 7PM-7AM, please contact night-coverage [email or web address] Password [email or web address]

## 2013-04-05 NOTE — Progress Notes (Signed)
Pt is to be discharged home today. Pt is in NAD, IV is out, all paperwork has been reviewed/discussed with patient, and there are no questions/concerns at this time. Assessment is unchanged from this morning. Pt is to be accompanied downstairs by staff and family via wheelchair.  

## 2013-04-15 ENCOUNTER — Other Ambulatory Visit (HOSPITAL_COMMUNITY): Payer: Self-pay | Admitting: Physical Medicine and Rehabilitation

## 2013-04-15 DIAGNOSIS — M25551 Pain in right hip: Secondary | ICD-10-CM

## 2013-04-23 ENCOUNTER — Ambulatory Visit (HOSPITAL_COMMUNITY)
Admission: RE | Admit: 2013-04-23 | Discharge: 2013-04-23 | Disposition: A | Discharge status: Home / Self Care | Payer: Medicare Other | Source: Ambulatory Visit | Attending: Physical Medicine and Rehabilitation | Admitting: Physical Medicine and Rehabilitation

## 2013-04-23 DIAGNOSIS — M169 Osteoarthritis of hip, unspecified: Secondary | ICD-10-CM | POA: Insufficient documentation

## 2013-04-23 DIAGNOSIS — M25551 Pain in right hip: Secondary | ICD-10-CM

## 2013-04-23 DIAGNOSIS — M1610 Unilateral primary osteoarthritis, unspecified hip: Secondary | ICD-10-CM | POA: Insufficient documentation

## 2013-05-20 IMAGING — CR DG ABDOMEN ACUTE W/ 1V CHEST
4 series · 4 of 4 positions shown · non-contrast
Comparison: 11/20/2012

CLINICAL DATA: Abdominal pain and distension.

ACUTE ABDOMEN SERIES (ABDOMEN 2 VIEW & CHEST 1 VIEW)

[w abdomen decub (1 of 2)]
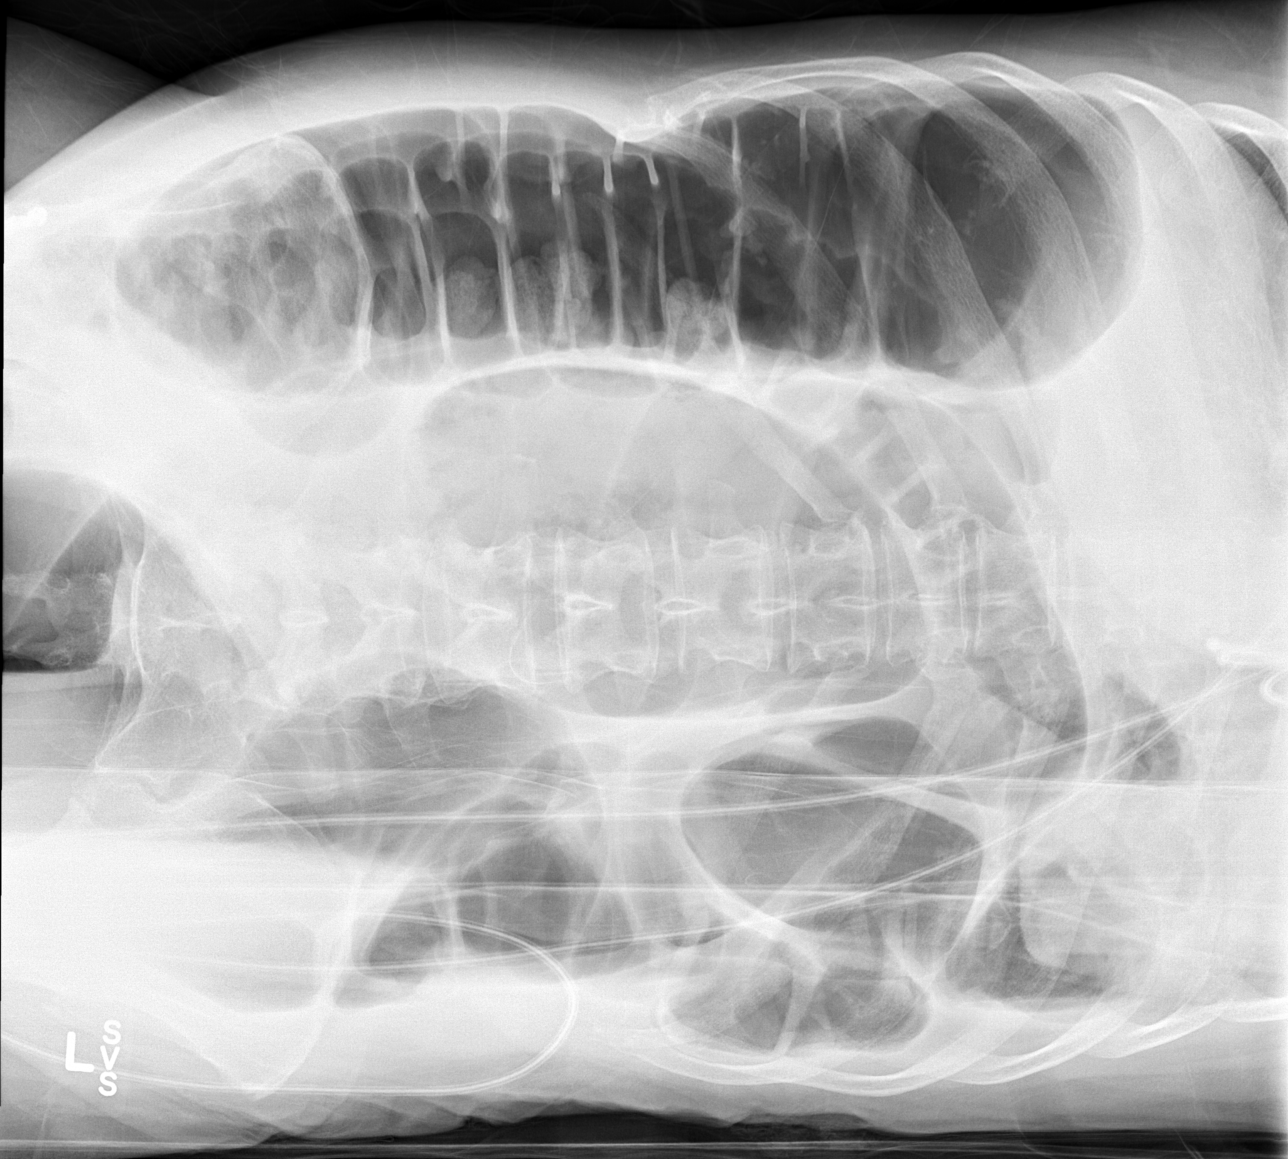

[w abdomen decub (2 of 2)]
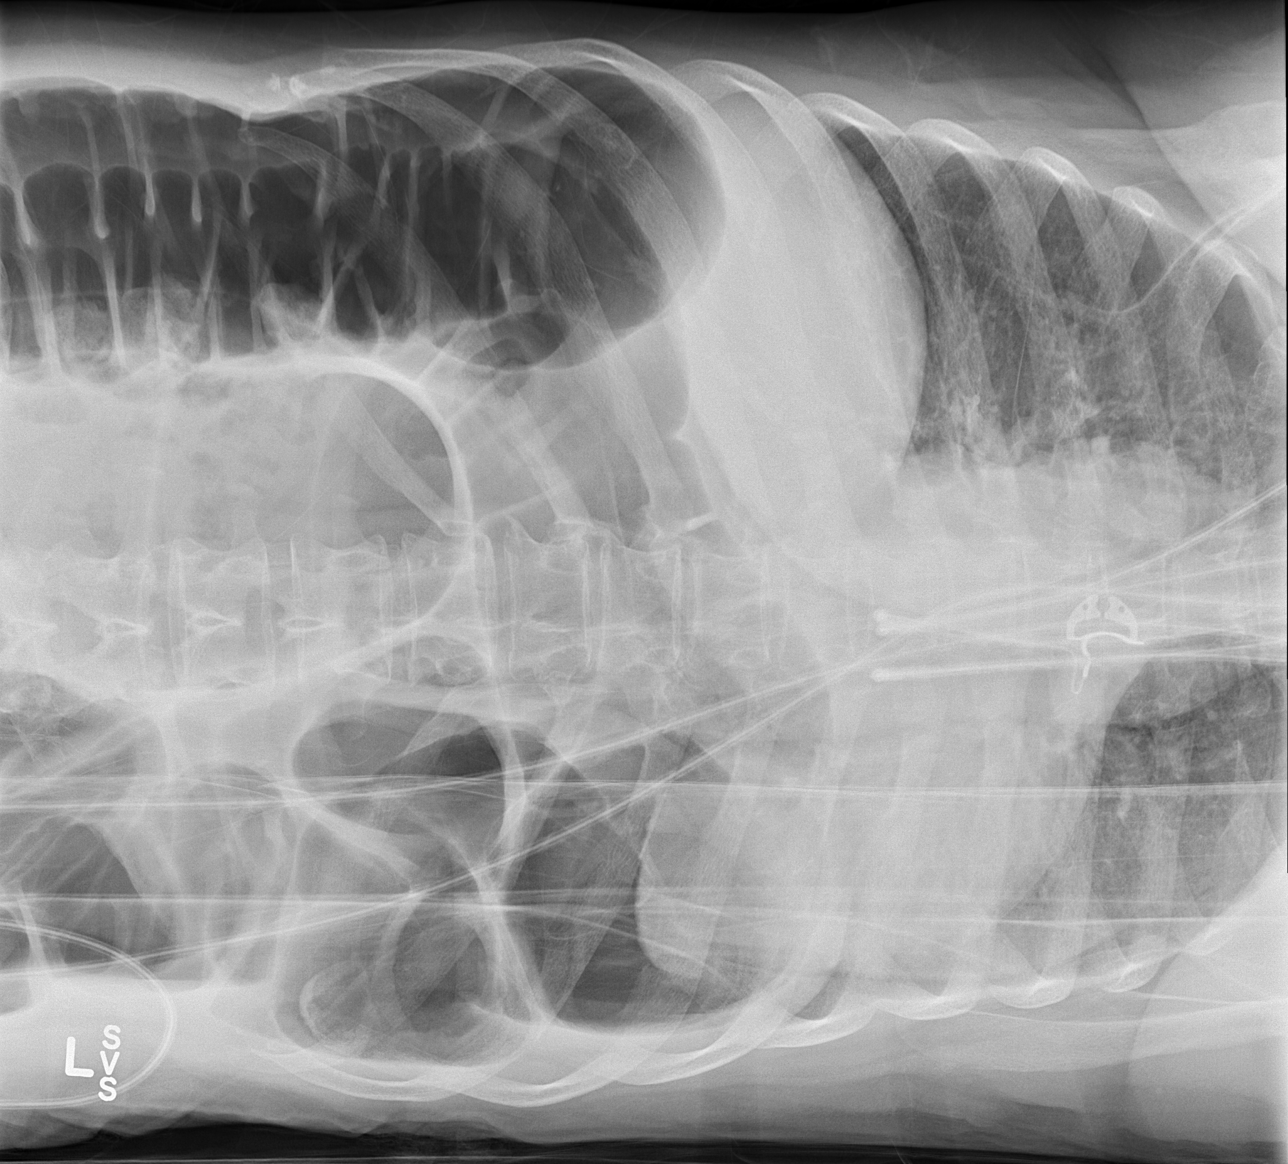

[x abdomen supine]
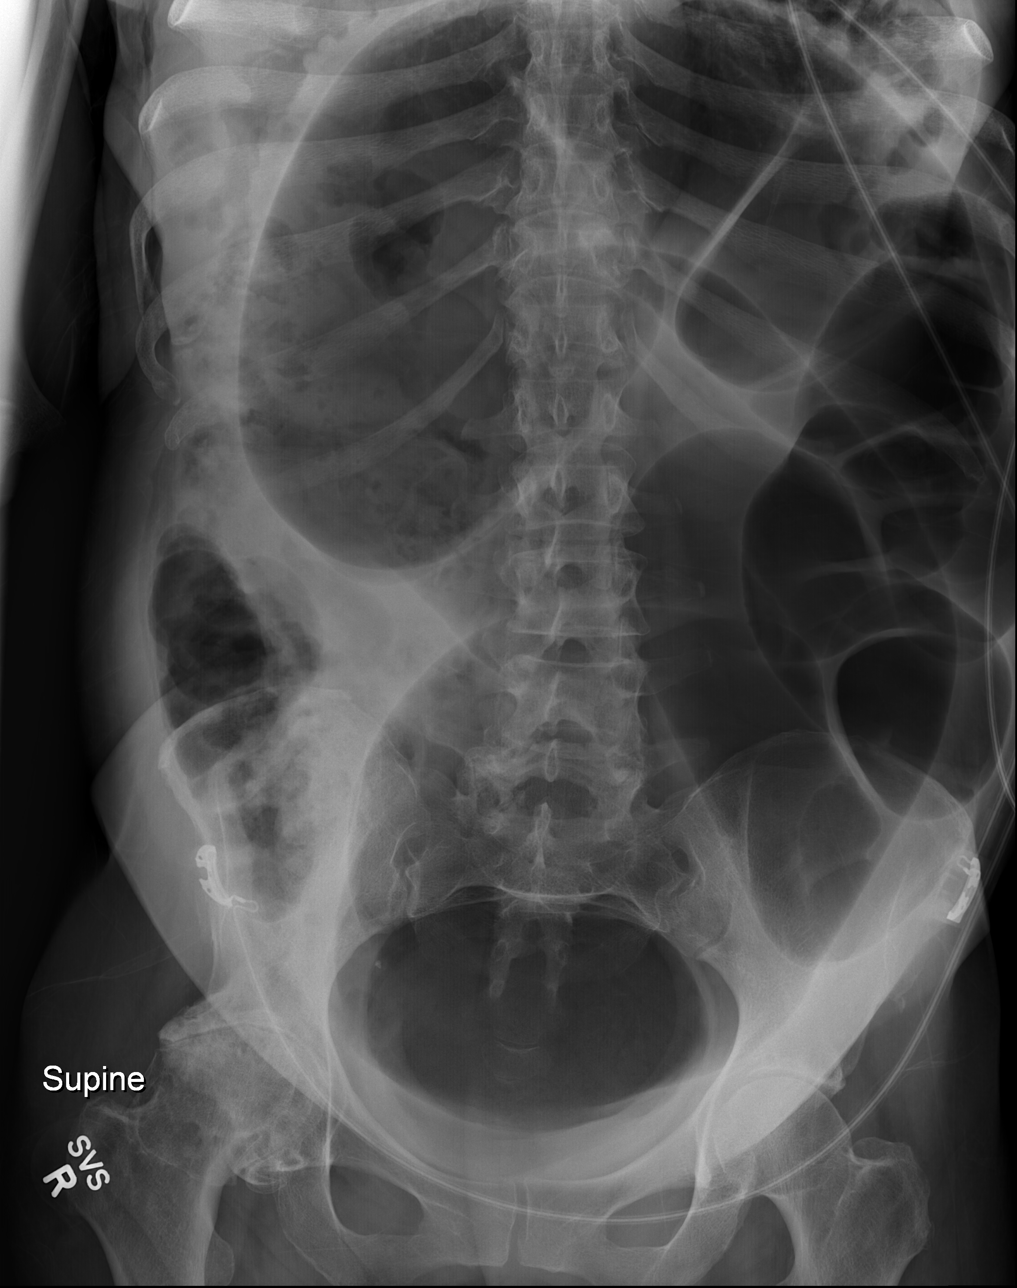

[x chest ap]
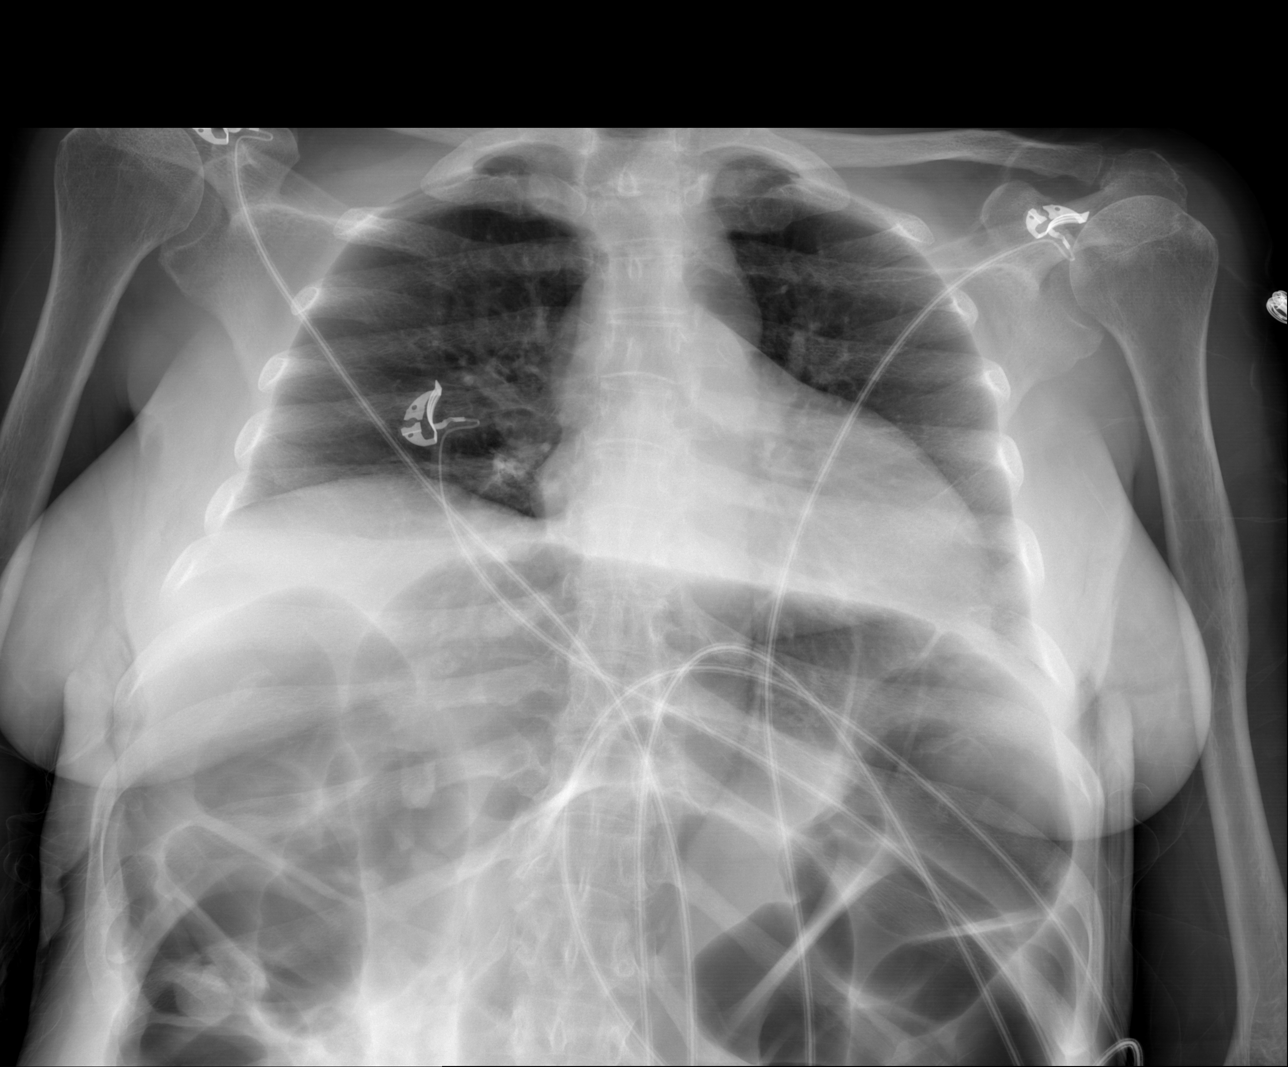

[4 of 4 positions shown; findings below may reference images not displayed]

FINDINGS: There is prominent diffuse gaseous distension throughout
the colon with stool filling a less distended right colon and
transverse colon.  Gas moves to the right colon on decubitus view.
Changes are most likely to represent colonic ileus.  Degree of
distension is stable since previous study. No free air in the
abdomen.  No abnormal air fluid levels.

Low lung volumes due to the distended abdomen.  The heart size and
pulmonary vascularity are normal for technique.  No focal airspace
consolidation is noted in the lungs.
IMPRESSION: Diffuse gaseous distension of the colon, likely due to colonic
ileus.  No significant interval change.

## 2013-05-30 ENCOUNTER — Inpatient Hospital Stay (HOSPITAL_COMMUNITY)
Admission: EM | Admit: 2013-05-30 | Discharge: 2013-06-02 | DRG: 388 | Disposition: A | Discharge status: Home Health | Payer: Medicare Other | Attending: Internal Medicine | Admitting: Internal Medicine

## 2013-05-30 ENCOUNTER — Emergency Department (HOSPITAL_COMMUNITY): Payer: Medicare Other

## 2013-05-30 ENCOUNTER — Encounter (HOSPITAL_COMMUNITY): Payer: Self-pay | Admitting: Emergency Medicine

## 2013-05-30 DIAGNOSIS — K5909 Other constipation: Secondary | ICD-10-CM | POA: Diagnosis present

## 2013-05-30 DIAGNOSIS — Z79899 Other long term (current) drug therapy: Secondary | ICD-10-CM

## 2013-05-30 DIAGNOSIS — K567 Ileus, unspecified: Secondary | ICD-10-CM | POA: Diagnosis present

## 2013-05-30 DIAGNOSIS — E43 Unspecified severe protein-calorie malnutrition: Secondary | ICD-10-CM | POA: Diagnosis present

## 2013-05-30 DIAGNOSIS — Z8673 Personal history of transient ischemic attack (TIA), and cerebral infarction without residual deficits: Secondary | ICD-10-CM

## 2013-05-30 DIAGNOSIS — E119 Type 2 diabetes mellitus without complications: Secondary | ICD-10-CM | POA: Diagnosis present

## 2013-05-30 DIAGNOSIS — IMO0002 Reserved for concepts with insufficient information to code with codable children: Secondary | ICD-10-CM

## 2013-05-30 DIAGNOSIS — K599 Functional intestinal disorder, unspecified: Secondary | ICD-10-CM | POA: Diagnosis present

## 2013-05-30 DIAGNOSIS — E876 Hypokalemia: Secondary | ICD-10-CM | POA: Diagnosis present

## 2013-05-30 DIAGNOSIS — F79 Unspecified intellectual disabilities: Secondary | ICD-10-CM | POA: Diagnosis present

## 2013-05-30 DIAGNOSIS — K560 Paralytic ileus: Principal | ICD-10-CM

## 2013-05-30 DIAGNOSIS — R109 Unspecified abdominal pain: Secondary | ICD-10-CM | POA: Diagnosis present

## 2013-05-30 DIAGNOSIS — K5900 Constipation, unspecified: Secondary | ICD-10-CM | POA: Diagnosis present

## 2013-05-30 DIAGNOSIS — I10 Essential (primary) hypertension: Secondary | ICD-10-CM | POA: Diagnosis present

## 2013-05-30 LAB — CBC WITH DIFFERENTIAL/PLATELET
Eosinophils Relative: 1 % (ref 0–5)
Hemoglobin: 12.7 g/dL (ref 12.0–15.0)
Lymphocytes Relative: 24 % (ref 12–46)
Lymphs Abs: 2.4 10*3/uL (ref 0.7–4.0)
MCH: 28.5 pg (ref 26.0–34.0)
MCV: 87.7 fL (ref 78.0–100.0)
Monocytes Relative: 7 % (ref 3–12)
Platelets: 343 10*3/uL (ref 150–400)
RBC: 4.46 MIL/uL (ref 3.87–5.11)
WBC: 10 10*3/uL (ref 4.0–10.5)

## 2013-05-30 LAB — URINALYSIS, ROUTINE W REFLEX MICROSCOPIC
Glucose, UA: NEGATIVE mg/dL
Hgb urine dipstick: NEGATIVE
Specific Gravity, Urine: 1.029 (ref 1.005–1.030)
Urobilinogen, UA: 1 mg/dL (ref 0.0–1.0)
pH: 6 (ref 5.0–8.0)

## 2013-05-30 LAB — COMPREHENSIVE METABOLIC PANEL
ALT: 36 U/L — ABNORMAL HIGH (ref 0–35)
Alkaline Phosphatase: 104 U/L (ref 39–117)
BUN: 12 mg/dL (ref 6–23)
CO2: 26 mEq/L (ref 19–32)
Calcium: 9.4 mg/dL (ref 8.4–10.5)
GFR calc Af Amer: >90 mL/min (ref >90)
GFR calc non Af Amer: >90 mL/min (ref >90)
Glucose, Bld: 114 mg/dL — ABNORMAL HIGH (ref 70–99)
Potassium: 3.3 mEq/L — ABNORMAL LOW (ref 3.5–5.1)
Sodium: 142 mEq/L (ref 135–145)

## 2013-05-30 LAB — MAGNESIUM: Magnesium: 2.1 mg/dL (ref 1.5–2.5)

## 2013-05-30 LAB — GLUCOSE, CAPILLARY: Glucose-Capillary: 131 mg/dL — ABNORMAL HIGH (ref 70–99)

## 2013-05-30 LAB — LIPASE, BLOOD: Lipase: 28 U/L (ref 11–59)

## 2013-05-30 MED ORDER — ENOXAPARIN SODIUM 30 MG/0.3ML ~~LOC~~ SOLN
30.0000 mg | Freq: Every day | SUBCUTANEOUS | Status: DC
  Administered 2013-05-31 – 2013-06-01 (×3): 30 mg via SUBCUTANEOUS
  Filled 2013-05-30 (×4): qty 0.3

## 2013-05-30 MED ORDER — POTASSIUM CHLORIDE IN NACL 20-0.9 MEQ/L-% IV SOLN
INTRAVENOUS | Status: AC
  Administered 2013-05-31: 01:00:00 via INTRAVENOUS
  Filled 2013-05-30: qty 1000

## 2013-05-30 MED ORDER — SENNA 8.6 MG PO TABS
1.0000 | ORAL_TABLET | Freq: Two times a day (BID) | ORAL | Status: DC
  Administered 2013-05-31 – 2013-06-02 (×4): 8.6 mg via ORAL
  Filled 2013-05-30 (×4): qty 1

## 2013-05-30 MED ORDER — ENOXAPARIN SODIUM 40 MG/0.4ML ~~LOC~~ SOLN
40.0000 mg | Freq: Every day | SUBCUTANEOUS | Status: DC
  Filled 2013-05-30: qty 0.4

## 2013-05-30 MED ORDER — BIOTENE DRY MOUTH MT LIQD
15.0000 mL | Freq: Two times a day (BID) | OROMUCOSAL | Status: DC
  Administered 2013-05-31 – 2013-06-02 (×6): 15 mL via OROMUCOSAL

## 2013-05-30 MED ORDER — SODIUM CHLORIDE 0.9 % IV SOLN: INTRAVENOUS | Status: DC

## 2013-05-30 MED ORDER — SODIUM CHLORIDE 0.9 % IJ SOLN
3.0000 mL | Freq: Two times a day (BID) | INTRAMUSCULAR | Status: DC
  Administered 2013-05-31 – 2013-06-01 (×3): 3 mL via INTRAVENOUS

## 2013-05-30 MED ORDER — LACTULOSE 10 GM/15ML PO SOLN
20.0000 g | Freq: Every day | ORAL | Status: DC
  Administered 2013-05-31 – 2013-06-02 (×4): 20 g via ORAL
  Filled 2013-05-30 (×4): qty 30

## 2013-05-30 MED ORDER — HYDROMORPHONE HCL PF 1 MG/ML IJ SOLN: 0.5000 mg | INTRAMUSCULAR | Status: DC | PRN

## 2013-05-30 MED ORDER — ONDANSETRON HCL 4 MG PO TABS: 4.0000 mg | ORAL_TABLET | Freq: Four times a day (QID) | ORAL | Status: DC | PRN

## 2013-05-30 MED ORDER — HYDROMORPHONE HCL PF 1 MG/ML IJ SOLN
1.0000 mg | Freq: Once | INTRAMUSCULAR | Status: AC
  Administered 2013-05-30: 1 mg via INTRAVENOUS
  Filled 2013-05-30: qty 1

## 2013-05-30 MED ORDER — ONDANSETRON HCL 4 MG/2ML IJ SOLN: 4.0000 mg | Freq: Four times a day (QID) | INTRAMUSCULAR | Status: DC | PRN

## 2013-05-30 MED ORDER — LORAZEPAM 0.5 MG PO TABS
0.5000 mg | ORAL_TABLET | Freq: Two times a day (BID) | ORAL | Status: DC | PRN
  Administered 2013-05-31 – 2013-06-01 (×4): 0.5 mg via ORAL
  Filled 2013-05-30 (×4): qty 1

## 2013-05-30 MED ORDER — SODIUM CHLORIDE 0.9 % IV BOLUS (SEPSIS)
500.0000 mL | Freq: Once | INTRAVENOUS | Status: AC
  Administered 2013-05-30: 500 mL via INTRAVENOUS

## 2013-05-30 MED ORDER — ONDANSETRON HCL 4 MG/2ML IJ SOLN
4.0000 mg | Freq: Once | INTRAMUSCULAR | Status: AC
  Administered 2013-05-30: 4 mg via INTRAVENOUS
  Filled 2013-05-30: qty 2

## 2013-05-30 MED ORDER — FLEET ENEMA 7-19 GM/118ML RE ENEM
1.0000 | ENEMA | Freq: Once | RECTAL | Status: AC | PRN
  Administered 2013-05-31: 1 via RECTAL
  Filled 2013-05-30: qty 1

## 2013-05-30 MED ORDER — METOCLOPRAMIDE HCL 5 MG PO TABS
5.0000 mg | ORAL_TABLET | Freq: Three times a day (TID) | ORAL | Status: DC
  Administered 2013-05-31 – 2013-06-02 (×8): 5 mg via ORAL
  Filled 2013-05-30 (×14): qty 1

## 2013-05-30 MED ORDER — POLYETHYLENE GLYCOL 3350 17 G PO PACK
17.0000 g | PACK | Freq: Every day | ORAL | Status: DC
  Administered 2013-05-31 – 2013-06-02 (×4): 17 g via ORAL
  Filled 2013-05-30 (×4): qty 1

## 2013-05-30 MED ORDER — DOCUSATE SODIUM 100 MG PO CAPS
100.0000 mg | ORAL_CAPSULE | Freq: Two times a day (BID) | ORAL | Status: DC | PRN
  Filled 2013-05-30: qty 1

## 2013-05-30 MED ORDER — LISINOPRIL 20 MG PO TABS
40.0000 mg | ORAL_TABLET | Freq: Every day | ORAL | Status: DC
  Administered 2013-05-31 – 2013-06-02 (×3): 40 mg via ORAL
  Filled 2013-05-30 (×4): qty 2

## 2013-05-30 MED ORDER — CARVEDILOL 25 MG PO TABS
25.0000 mg | ORAL_TABLET | Freq: Two times a day (BID) | ORAL | Status: DC
  Administered 2013-05-31 – 2013-06-02 (×5): 25 mg via ORAL
  Filled 2013-05-30 (×8): qty 1

## 2013-05-30 MED ORDER — SODIUM CHLORIDE 0.9 % IV SOLN
INTRAVENOUS | Status: DC
  Administered 2013-05-30: 19:00:00 via INTRAVENOUS

## 2013-05-30 MED ORDER — AMITRIPTYLINE HCL 25 MG PO TABS
25.0000 mg | ORAL_TABLET | Freq: Every day | ORAL | Status: DC
  Administered 2013-05-31 – 2013-06-01 (×2): 25 mg via ORAL
  Filled 2013-05-30 (×4): qty 1

## 2013-05-30 MED ORDER — HYDROCODONE-ACETAMINOPHEN 5-325 MG PO TABS
1.0000 | ORAL_TABLET | Freq: Four times a day (QID) | ORAL | Status: DC | PRN
  Administered 2013-06-01: 1 via ORAL
  Filled 2013-05-30: qty 1

## 2013-05-30 NOTE — Plan of Care (Signed)
Problem: Phase III Progression Outcomes Goal: Voiding independently Outcome: Completed/Met Date Met:  05/30/13 With mobility assistance

## 2013-05-30 NOTE — ED Notes (Signed)
Only able to pull one tube of blood with IV start- lab notified per secretary

## 2013-05-30 NOTE — ED Notes (Addendum)
Patient's PCG reports constipation x 3 days and vomiting last night.  PCG also reports fever up to 101.2 that she treated with tylenol last night.  Patient's abdomen in severely distended on arrival.

## 2013-05-30 NOTE — H&P (Signed)
PCP:   Paulino Rily, MD   Chief Complaint:  constipated  HPI: 62 yo female h/o mental retardation, chronic constipation, frequent hospitalizations for obsipation/ileus has an aggressive home medication regimen for chronic constipation that her sister stays very adamant about comes in with 2 days of no BM and hardly any flatus per sister and started to get nauseated yesterday.  Her sister is her primary care giver and it knows that when her bowels do not move daily she quickly gets worse.  She did have a low grade fever yesterday but none since.  Per sister, she is chronically very distended and her abdominal distention she has today is no worse or better than it usually is.  She has been nauseated but not vomiting.  She also has been refusing to take her bp meds the last several days due to nausea.   Review of Systems:  Positive and negative as per HPI otherwise all other systems are negative  Past Medical History: Past Medical History  Diagnosis Date  . Hypertension   . Stroke   . Diabetes mellitus   . Chronic constipation    Past Surgical History  Procedure Laterality Date  . No past surgeries      Medications: Prior to Admission medications   Medication Sig Start Date End Date Taking? Authorizing Provider  amitriptyline (ELAVIL) 25 MG tablet Take 25 mg by mouth at bedtime.   Yes Historical Provider, MD  carvedilol (COREG) 25 MG tablet Take 25 mg by mouth 2 (two) times daily with a meal.   Yes Historical Provider, MD  docusate sodium 100 MG CAPS Take 100 mg by mouth 2 (two) times daily as needed for constipation. 04/05/13  Yes Dorothea Ogle, MD  HYDROcodone-acetaminophen (NORCO/VICODIN) 5-325 MG per tablet Take 1 tablet by mouth every 6 (six) hours as needed for pain. 04/05/13  Yes Dorothea Ogle, MD  IRON PO Take 1 tablet by mouth daily.   Yes Historical Provider, MD  lactulose (CHRONULAC) 10 GM/15ML solution Take 20 g by mouth daily. 02/14/13  Yes Adeline C Viyuoh, MD  lisinopril  (PRINIVIL,ZESTRIL) 40 MG tablet Take 40 mg by mouth daily.   Yes Historical Provider, MD  LORazepam (ATIVAN) 0.5 MG tablet Take 1 tablet (0.5 mg total) by mouth 2 (two) times daily as needed for anxiety (agitation.). 04/05/13  Yes Dorothea Ogle, MD  metFORMIN (GLUCOPHAGE) 500 MG tablet Take 500 mg by mouth 2 (two) times daily with a meal.   Yes Historical Provider, MD  metoCLOPramide (REGLAN) 5 MG tablet Take 1 tablet (5 mg total) by mouth 4 (four) times daily -  before meals and at bedtime. 02/14/13  Yes [name], MD  Multiple Vitamins-Minerals (MULTIVITAMINS THER. W/MINERALS) TABS Take 1 tablet by mouth daily.     Yes Historical Provider, MD  polyethylene glycol (MIRALAX / GLYCOLAX) packet Take 17 g by mouth daily. 11/21/12  Yes Standley Brooking, MD  senna (SENOKOT) 8.6 MG TABS Take 1 tablet (8.6 mg total) by mouth 2 (two) times daily. 04/05/13  Yes Dorothea Ogle, MD    Allergies:  No Known Allergies  Social History:  reports that she has never smoked. She has never used smokeless tobacco. She reports that she does not drink alcohol or use illicit drugs.  Family History: Family History  Problem Relation Age of Onset  . Diabetes Mother     Physical Exam: Filed Vitals:   05/30/13 1607  BP: 221/162  Pulse: 126  Temp:  98.3 F (36.8 C)  TempSrc: Oral  Resp: 16  SpO2: 100%   General appearance: alert, cooperative and no distress Head: Normocephalic, without obvious abnormality, atraumatic Eyes: negative Nose: Nares normal. Septum midline. Mucosa normal. No drainage or sinus tenderness. Neck: no JVD and supple, symmetrical, trachea midline Lungs: clear to auscultation bilaterally Heart: regular rate and rhythm, S1, S2 normal, no murmur, click, rub or gallop Abdomen: abnormal findings:  distended Extremities: extremities normal, atraumatic, no cyanosis or edema Pulses: 2+ and symmetric Skin: Skin color, texture, turgor normal. No rashes or lesions Neurologic: Grossly  normal    Labs on Admission:   Recent Labs  05/30/13 1730  NA 142  K 3.3*  CL 104  CO2 26  GLUCOSE 114*  BUN 12  CREATININE 0.54  CALCIUM 9.4    Recent Labs  05/30/13 1730  AST 31  ALT 36*  ALKPHOS 104  BILITOT 0.5  PROT 7.7  ALBUMIN 4.0    Recent Labs  05/30/13 1730  LIPASE 28    Recent Labs  05/30/13 1730  WBC 10.0  NEUTROABS 6.8  HGB 12.7  HCT 39.1  MCV 87.7  PLT 343    Radiological Exams on Admission: Dg Abd Acute W/chest  05/30/2013   *RADIOLOGY REPORT*  Clinical Data: Chronic intra-abdominal distention  ACUTE ABDOMEN SERIES (ABDOMEN 2 VIEW & CHEST 1 VIEW)  Comparison: Most recent 04/02/2013.  Findings:  The hemidiaphragms are elevated.  Within limits of expansion for low lung volumes, the lungs appear clear.  The heart size within normal limits.  There is diffuse and chronic gaseous distention of the colon demonstrated on numerous prior imaging studies.  No evidence for free intraperitoneal air.  There is no small bowel dilatation. Within limits for evaluation, no abnormal intra-abdominal calcifications.  Negative osseous structures.  IMPRESSION: Chronic diffuse colonic dilatation without evidence for free air. No active cardiopulmonary disease.   Original Report Authenticated By: Davonna Belling, M.D.    Assessment/Plan 62 yo female with ileus/chronic abd distention/obstipation with no bm for 2 days  Principal Problem:   Ileus Active Problems:   Obstipation   HTN (hypertension)   Constipation, chronic   DM (diabetes mellitus)   Colonic inertia   Abdominal  pain, other specified site   Mental retardation  Place in obs and continue normal home regimen, add rectal tube and a fleets enema.  Monitor closely for fever, her cxr and ua are negative here.  Place on ivf with kcl and ck mag level.  Her bp is improved with pain management in the ED, cont oral meds for hypertension along with liquid diet for now.  AAS without any free air.  Full code per  sister.  obs on tele floor.    DAVID,RACHAL A 05/30/2013, 7:28 PM

## 2013-05-30 NOTE — ED Provider Notes (Signed)
History     CSN: [id number]  Arrival date & time 05/30/13  1555   First MD Initiated Contact with Patient 05/30/13 1603      Chief Complaint  Patient presents with  . Constipation  . Emesis    (Consider location/radiation/quality/duration/timing/severity/associated sxs/prior treatment) The history is provided by the patient and a relative.   62 year old female here with her daughter who is her primary provider. Patient has a history of recurrent constipation despite strict regimen at home to prevent that. She carries the diagnosis of colon that does not work very well. Because patient been building up for the past 3 days vomiting started last night. Patient blood pressure also goes up when this occurs maybe due to pain. Patient's had a fever last evening to 101.2 but is now resolved. Patient's abdomen as per the provider relative is severely distended. She normally requires admission for this about every 3 months they do a rectal tube in the clean her out.  Past Medical History  Diagnosis Date  . Hypertension   . Stroke   . Diabetes mellitus   . Chronic constipation     Past Surgical History  Procedure Laterality Date  . No past surgeries      Family History  Problem Relation Age of Onset  . Diabetes Mother     History  Substance Use Topics  . Smoking status: Never Smoker   . Smokeless tobacco: Never Used  . Alcohol Use: No     Comment: occasional wine    OB History   Grav Para Term Preterm Abortions TAB SAB Ect Mult Living                  Review of Systems  Constitutional: Positive for fever and appetite change.  HENT: Negative for congestion.   Eyes: Negative for redness.  Respiratory: Negative for shortness of breath.   Cardiovascular: Negative for chest pain.  Gastrointestinal: Positive for nausea, vomiting, abdominal pain and abdominal distention. Negative for diarrhea.  Genitourinary: Negative for dysuria.  Musculoskeletal: Negative for back pain.   Skin: Negative for rash.  Neurological: Negative for headaches.  Hematological: Does not bruise/bleed easily.    Allergies  Review of patient's allergies indicates no known allergies.  Home Medications   Current Outpatient Rx  Name  Route  Sig  Dispense  Refill  . amitriptyline (ELAVIL) 25 MG tablet   Oral   Take 25 mg by mouth at bedtime.         . carvedilol (COREG) 25 MG tablet   Oral   Take 25 mg by mouth 2 (two) times daily with a meal.         . docusate sodium 100 MG CAPS   Oral   Take 100 mg by mouth 2 (two) times daily as needed for constipation.   60 capsule   1   . HYDROcodone-acetaminophen (NORCO/VICODIN) 5-325 MG per tablet   Oral   Take 1 tablet by mouth every 6 (six) hours as needed for pain.   65 tablet   0   . IRON PO   Oral   Take 1 tablet by mouth daily.         Marland Kitchen lactulose (CHRONULAC) 10 GM/15ML solution   Oral   Take 20 g by mouth daily.         Marland Kitchen lisinopril (PRINIVIL,ZESTRIL) 40 MG tablet   Oral   Take 40 mg by mouth daily.         Marland Kitchen  LORazepam (ATIVAN) 0.5 MG tablet   Oral   Take 1 tablet (0.5 mg total) by mouth 2 (two) times daily as needed for anxiety (agitation.).   60 tablet   1   . metFORMIN (GLUCOPHAGE) 500 MG tablet   Oral   Take 500 mg by mouth 2 (two) times daily with a meal.         . metoCLOPramide (REGLAN) 5 MG tablet   Oral   Take 1 tablet (5 mg total) by mouth 4 (four) times daily -  before meals and at bedtime.   120 tablet   0   . Multiple Vitamins-Minerals (MULTIVITAMINS THER. W/MINERALS) TABS   Oral   Take 1 tablet by mouth daily.           . polyethylene glycol (MIRALAX / GLYCOLAX) packet   Oral   Take 17 g by mouth daily.         Marland Kitchen senna (SENOKOT) 8.6 MG TABS   Oral   Take 1 tablet (8.6 mg total) by mouth 2 (two) times daily.   120 each   0     BP 221/162  Pulse 126  Temp(Src) 98.3 F (36.8 C) (Oral)  Resp 16  SpO2 100%  Physical Exam  Nursing note and vitals  reviewed. Constitutional: She appears well-developed and well-nourished. No distress.  HENT:  Head: Normocephalic and atraumatic.  Mucous membranes slightly dry.  Eyes: Conjunctivae and EOM are normal. Pupils are equal, round, and reactive to light.  Neck: Normal range of motion.  Cardiovascular: Regular rhythm.   Tachycardic but regular  Pulmonary/Chest: Effort normal and breath sounds normal. No respiratory distress.  Abdominal: She exhibits distension. There is no tenderness.  Musculoskeletal: Normal range of motion. She exhibits no edema and no tenderness.  Neurological: She is alert. No cranial nerve deficit. She exhibits normal muscle tone. Coordination normal.  Skin: Skin is warm. No rash noted. She is not diaphoretic. No erythema.    ED Course  Procedures (including critical care time)  Labs Reviewed  URINALYSIS, ROUTINE W REFLEX MICROSCOPIC - Abnormal; Notable for the following:    Color, Urine AMBER (*)    Bilirubin Urine SMALL (*)    Protein, ur 100 (*)    Leukocytes, UA SMALL (*)    All other components within normal limits  COMPREHENSIVE METABOLIC PANEL - Abnormal; Notable for the following:    Potassium 3.3 (*)    Glucose, Bld 114 (*)    ALT 36 (*)    All other components within normal limits  CBC WITH DIFFERENTIAL  LIPASE, BLOOD  URINE MICROSCOPIC-ADD ON   Dg Abd Acute W/chest  05/30/2013   *RADIOLOGY REPORT*  Clinical Data: Chronic intra-abdominal distention  ACUTE ABDOMEN SERIES (ABDOMEN 2 VIEW & CHEST 1 VIEW)  Comparison: Most recent 04/02/2013.  Findings:  The hemidiaphragms are elevated.  Within limits of expansion for low lung volumes, the lungs appear clear.  The heart size within normal limits.  There is diffuse and chronic gaseous distention of the colon demonstrated on numerous prior imaging studies.  No evidence for free intraperitoneal air.  There is no small bowel dilatation. Within limits for evaluation, no abnormal intra-abdominal calcifications.   Negative osseous structures.  IMPRESSION: Chronic diffuse colonic dilatation without evidence for free air. No active cardiopulmonary disease.   Original Report Authenticated By: Davonna Belling, M.D.   Results for orders placed during the hospital encounter of 05/30/13  URINALYSIS, ROUTINE W REFLEX MICROSCOPIC      Result  Value Range   Color, Urine AMBER (*) YELLOW   APPearance CLEAR  CLEAR   Specific Gravity, Urine 1.029  1.005 - 1.030   pH 6.0  5.0 - 8.0   Glucose, UA NEGATIVE  NEGATIVE mg/dL   Hgb urine dipstick NEGATIVE  NEGATIVE   Bilirubin Urine SMALL (*) NEGATIVE   Ketones, ur NEGATIVE  NEGATIVE mg/dL   Protein, ur 161 (*) NEGATIVE mg/dL   Urobilinogen, UA 1.0  0.0 - 1.0 mg/dL   Nitrite NEGATIVE  NEGATIVE   Leukocytes, UA SMALL (*) NEGATIVE  CBC WITH DIFFERENTIAL      Result Value Range   WBC 10.0  4.0 - 10.5 K/uL   RBC 4.46  3.87 - 5.11 MIL/uL   Hemoglobin 12.7  12.0 - 15.0 g/dL   HCT 09.6  04.5 - 40.9 %   MCV 87.7  78.0 - 100.0 fL   MCH 28.5  26.0 - 34.0 pg   MCHC 32.5  30.0 - 36.0 g/dL   RDW 81.1  91.4 - 78.2 %   Platelets 343  150 - 400 K/uL   Neutrophils Relative % 68  43 - 77 %   Neutro Abs 6.8  1.7 - 7.7 K/uL   Lymphocytes Relative 24  12 - 46 %   Lymphs Abs 2.4  0.7 - 4.0 K/uL   Monocytes Relative 7  3 - 12 %   Monocytes Absolute 0.7  0.1 - 1.0 K/uL   Eosinophils Relative 1  0 - 5 %   Eosinophils Absolute 0.1  0.0 - 0.7 K/uL   Basophils Relative 0  0 - 1 %   Basophils Absolute 0.0  0.0 - 0.1 K/uL  COMPREHENSIVE METABOLIC PANEL      Result Value Range   Sodium 142  135 - 145 mEq/L   Potassium 3.3 (*) 3.5 - 5.1 mEq/L   Chloride 104  96 - 112 mEq/L   CO2 26  19 - 32 mEq/L   Glucose, Bld 114 (*) 70 - 99 mg/dL   BUN 12  6 - 23 mg/dL   Creatinine, Ser 9.56  0.50 - 1.10 mg/dL   Calcium 9.4  8.4 - 21.3 mg/dL   Total Protein 7.7  6.0 - 8.3 g/dL   Albumin 4.0  3.5 - 5.2 g/dL   AST 31  0 - 37 U/L   ALT 36 (*) 0 - 35 U/L   Alkaline Phosphatase 104  39 - 117 U/L    Total Bilirubin 0.5  0.3 - 1.2 mg/dL   GFR calc non Af Amer >90  >90 mL/min   GFR calc Af Amer >90  >90 mL/min  LIPASE, BLOOD      Result Value Range   Lipase 28  11 - 59 U/L  URINE MICROSCOPIC-ADD ON      Result Value Range   Squamous Epithelial / LPF RARE  RARE   WBC, UA 3-6  <3 WBC/hpf   Urine-Other MUCOUS PRESENT       1. Colonic inertia       MDM  Patient presents with recurrent constipation. This is a recurrent problem for her. Last admitted in April she usually admitted and a clean her out. Presentation is typical he only thing that was somewhat different is that they reported having a fever at home up to harm 1.2. Patient's been afebrile here chest x-ray negative for pneumonia urinalysis negative for urinary tract infection. Also of abdominal series of the abdomen negative for any free air. Does  show a large amount of stool. Patient blood pressure was initially elevated at 221/162 however with pain medication and improved on its own. Patient had refused it her blood pressure medication for the past few days.  Discussed with hospitalist admitting team they'll mid to telemetry temporary middle orders completed. Patient nontoxic no acute distress. Patient markedly improved with pain medicine. Do not feel is any evidence of an acute surgical abdomen.        Shelda Jakes, MD 05/30/13 1945

## 2013-05-31 DIAGNOSIS — K5989 Other specified functional intestinal disorders: Secondary | ICD-10-CM

## 2013-05-31 LAB — CBC
MCHC: 32.5 g/dL (ref 30.0–36.0)
RDW: 14.3 % (ref 11.5–15.5)

## 2013-05-31 LAB — BASIC METABOLIC PANEL
BUN: 9 mg/dL (ref 6–23)
Creatinine, Ser: 0.56 mg/dL (ref 0.50–1.10)
GFR calc Af Amer: >90 mL/min (ref >90)
GFR calc non Af Amer: >90 mL/min (ref >90)

## 2013-05-31 MED ORDER — POTASSIUM CHLORIDE 10 MEQ/100ML IV SOLN
10.0000 meq | INTRAVENOUS | Status: AC
  Administered 2013-05-31 (×5): 10 meq via INTRAVENOUS
  Filled 2013-05-31 (×5): qty 100

## 2013-05-31 MED ORDER — INSULIN ASPART 100 UNIT/ML ~~LOC~~ SOLN
0.0000 [IU] | Freq: Three times a day (TID) | SUBCUTANEOUS | Status: DC
  Administered 2013-06-01: 1 [IU] via SUBCUTANEOUS

## 2013-05-31 MED ORDER — HYDRALAZINE HCL 20 MG/ML IJ SOLN
10.0000 mg | Freq: Four times a day (QID) | INTRAMUSCULAR | Status: DC | PRN
  Administered 2013-05-31: 10 mg via INTRAVENOUS
  Filled 2013-05-31: qty 1

## 2013-05-31 MED ORDER — INSULIN ASPART 100 UNIT/ML ~~LOC~~ SOLN: 0.0000 [IU] | Freq: Every day | SUBCUTANEOUS | Status: DC

## 2013-05-31 NOTE — Progress Notes (Signed)
Hypoglycemic Event  CBG: 67  Treatment: 15 GM carbohydrate snack  Symptoms: None  Follow-up CBG: Time:1714 CBG Result:73  Possible Reasons for Event: Inadequate meal intake  Comments/MD notified:Rama    Liah Morr H  Remember to initiate Hypoglycemia Order Set & complete

## 2013-05-31 NOTE — Progress Notes (Signed)
[facility] PROGRESS NOTE  [name]:[id number] DOB: 1951/10/10 DOA: 05/30/2013 PCP: Paulino Rily, MD  Brief narrative: [name] is an 62 y.o. female with a PMH of mental retardation, chronic constipation, frequent hospitalizations for obstipation/ileus who is admitted with her usual obstipation type symptoms on 05/30/2013.  Assessment/Plan: Principal Problem:   Ileus with obstipation / colonic inertia / chronic constipation -Rectal tube not placed yet, ordered. Minimal results with Fleets, we'll do soapsuds enema. -Currently on a clear liquid diet. Advance to full liquids. -Continue docusate, MiraLAX, lactulose, Senokot and Reglan. Active Problems:   Hypokalemia -We'll give K. runs x5 today. Potassium also added to IV fluids.   HTN (hypertension) -Continue Coreg and lisinopril.   DM (diabetes mellitus) -Metformin currently on hold. -Start insulin sensitive sliding scale.   Abdominal  pain, other specified site -Secondary to obstipation.   Mental retardation -Supportive care.  Code Status: Full. Family Communication: Sister at bedside. Disposition Plan: Home when stable.   Medical Consultants:  None.  Other Consultants:  None.  Anti-infectives:  None.  HPI/Subjective: [name] is sitting on the commode straining in her stool. She has had only minimal results with the fleets enema and remains with significant abdominal distention.  Objective: Filed Vitals:   05/30/13 2123 05/31/13 0052 05/31/13 0217 05/31/13 0628  BP: 202/100 174/84 170/80 101/57  Pulse: 92 90  69  Temp: 97.5 F (36.4 C) 98 F (36.7 C)  97.7 F (36.5 C)  TempSrc: Oral Oral  Oral  Resp: 20 18  16   Height: 4\' 9"  (1.448 m)     Weight: 40.96 kg (90 lb 4.8 oz)     SpO2: 100%   100%    Intake/Output Summary (Last 24 hours) at 05/31/13 1478 Last data filed at 05/31/13 0400  Gross per 24 hour  Intake    500 ml  Output     91 ml  Net    409 ml     Exam: Gen:  NAD Cardiovascular:  RRR, No M/R/G Respiratory:  Lung sounds are diminished Gastrointestinal:  Abdomen distended, firm with positive bowel sounds Extremities:  No C/E/C  Data Reviewed: Basic Metabolic Panel:  Recent Labs Lab 05/30/13 1730 05/31/13 0510  NA 142 140  K 3.3* 3.2*  CL 104 108  CO2 26 25  GLUCOSE 114* 123*  BUN 12 9  CREATININE 0.54 0.56  CALCIUM 9.4 8.7  MG 2.1  --    GFR Estimated Creatinine Clearance: 45.6 ml/min (by C-G formula based on Cr of 0.56). Liver Function Tests:  Recent Labs Lab 05/30/13 1730  AST 31  ALT 36*  ALKPHOS 104  BILITOT 0.5  PROT 7.7  ALBUMIN 4.0    Recent Labs Lab 05/30/13 1730  LIPASE 28   CBC:  Recent Labs Lab 05/30/13 1730 05/31/13 0510  WBC 10.0 8.5  NEUTROABS 6.8  --   HGB 12.7 10.0*  HCT 39.1 30.8*  MCV 87.7 88.0  PLT 343 286   CBG:  Recent Labs Lab 05/30/13 2221  GLUCAP 131*     Procedures and Diagnostic Studies: Dg Abd Acute W/chest  05/30/2013   *RADIOLOGY REPORT*  Clinical Data: Chronic intra-abdominal distention  ACUTE ABDOMEN SERIES (ABDOMEN 2 VIEW & CHEST 1 VIEW)  Comparison: Most recent 04/02/2013.  Findings:  The hemidiaphragms are elevated.  Within limits of expansion for low lung volumes, the lungs appear clear.  The heart size within normal limits.  There is diffuse and chronic gaseous distention of the colon  demonstrated on numerous prior imaging studies.  No evidence for free intraperitoneal air.  There is no small bowel dilatation. Within limits for evaluation, no abnormal intra-abdominal calcifications.  Negative osseous structures.  IMPRESSION: Chronic diffuse colonic dilatation without evidence for free air. No active cardiopulmonary disease.   Original Report Authenticated By: Davonna Belling, M.D.    Scheduled Meds: . amitriptyline  25 mg Oral QHS  . antiseptic oral rinse  15 mL Mouth Rinse BID  . carvedilol  25 mg Oral BID WC  . enoxaparin (LOVENOX) injection  30 mg  Subcutaneous QHS  . lactulose  20 g Oral Daily  . lisinopril  40 mg Oral Daily  . metoCLOPramide  5 mg Oral TID AC & HS  . polyethylene glycol  17 g Oral Daily  . senna  1 tablet Oral BID  . sodium chloride  3 mL Intravenous Q12H   Continuous Infusions: . 0.9 % NaCl with KCl 20 mEq / L 75 mL/hr at 05/31/13 0114    Time spent: 25 minutes.   LOS: 1 day   RAMA,CHRISTINA  [facility] Pager [phone].   *Please note that the hospitalists switch teams on Wednesdays. Please call the flow manager at [phone] if you are having difficulty reaching the hospitalist taking care of this patient as she can update you and provide the most up-to-date pager number of provider caring for the patient. If 8PM-8AM, please contact night-coverage at [email or web address], password [facility]  05/31/2013, 8:08 AM

## 2013-05-31 NOTE — Progress Notes (Signed)
Pt's abdomen visibly became smaller, and felt softer with administration of soap suds enema and flexiseal placement.  Pt now has a non-distended, soft abdomen.  Pt's sister reports that the pt feels much better.

## 2013-06-01 LAB — BASIC METABOLIC PANEL
BUN: 5 mg/dL — ABNORMAL LOW (ref 6–23)
CO2: 27 mEq/L (ref 19–32)
Calcium: 9.4 mg/dL (ref 8.4–10.5)
Chloride: 106 mEq/L (ref 96–112)
Creatinine, Ser: 0.61 mg/dL (ref 0.50–1.10)

## 2013-06-01 LAB — GLUCOSE, CAPILLARY
Glucose-Capillary: 125 mg/dL — ABNORMAL HIGH (ref 70–99)
Glucose-Capillary: 98 mg/dL (ref 70–99)
Glucose-Capillary: 113 mg/dL — ABNORMAL HIGH (ref 70–99)

## 2013-06-01 MED ORDER — ENSURE COMPLETE PO LIQD
237.0000 mL | Freq: Two times a day (BID) | ORAL | Status: DC
  Administered 2013-06-01 – 2013-06-02 (×2): 237 mL via ORAL

## 2013-06-01 NOTE — Progress Notes (Signed)
Soap suds enema given per Md order, patient tolerated the procedure without pain,abdomen distended at this time, sister at bedside and said she usually administer soapsuds to her every other day and will tolerate up to 1200cc,she said her sister will keep it for 15-20 mins until she gets results,appreciative of care given to patient. Will endorsed accordingly.- Hulda Marin RN

## 2013-06-01 NOTE — Progress Notes (Signed)
Patient had a large loose bowel movement from the soap suds enema.Hulda Marin RN

## 2013-06-01 NOTE — Progress Notes (Signed)
[facility] PROGRESS NOTE  [name] WUJ:[id number] DOB: 15-Jan-1951 DOA: 05/30/2013 PCP: Paulino Rily, MD  Brief narrative: [name] is an 62 y.o. female with a PMH of mental retardation, chronic constipation, frequent hospitalizations for obstipation/ileus who is admitted with her usual obstipation type symptoms on 05/30/2013.  Assessment/Plan: Principal Problem:   Ileus with obstipation / colonic inertia / chronic constipation -Abdomen much less distended s/p soapsuds enema and insertion of rectal tube.   -Give an additional soap suds enema tonight and remove the rectal tube. -Currently on a full liquid diet. Advance further as tolerated. -Continue docusate, MiraLAX, lactulose, Senokot and Reglan. Active Problems:   Hypokalemia -Resolved s/p K. runs x5.   HTN (hypertension) -Continue Coreg and lisinopril.   DM (diabetes mellitus) -Metformin currently on hold. -Continue sensitive sliding scale.   Abdominal  pain, other specified site -Secondary to obstipation.   Mental retardation -Supportive care.  Code Status: Full. Family Communication: Sister at bedside. Disposition Plan: Home when stable.   Medical Consultants:  None.  Other Consultants:  None.  Anti-infectives:  None.  HPI/Subjective: [name] is non-verbal, but seems more comfortable and in good spirits.  She had good results from the enema yesterday, and has liquid stools in her rectal tube.  Objective: Filed Vitals:   05/31/13 1520 05/31/13 2119 06/01/13 0533 06/01/13 0939  BP: 112/68 137/64 160/72 133/70  Pulse: 81 70 65   Temp: 98.1 F (36.7 C) 97.7 F (36.5 C) 97.7 F (36.5 C)   TempSrc: Oral Oral Oral   Resp: 17 16 16    Height:      Weight:      SpO2: 100% 100% 100%     Intake/Output Summary (Last 24 hours) at 06/01/13 1018 Last data filed at 06/01/13 0940  Gross per 24 hour  Intake    360 ml  Output   1770 ml  Net  -1410 ml    Exam: Gen:   NAD Cardiovascular:  RRR, No M/R/G Respiratory:  Lung sounds are diminished Gastrointestinal:  Abdomen less distended, positive bowel sounds Extremities:  No C/E/C  Data Reviewed: Basic Metabolic Panel:  Recent Labs Lab 05/30/13 1730 05/31/13 0510 06/01/13 0525  NA 142 140 140  K 3.3* 3.2* 4.2  CL 104 108 106  CO2 26 25 27   GLUCOSE 114* 123* 74  BUN 12 9 5*  CREATININE 0.54 0.56 0.61  CALCIUM 9.4 8.7 9.4  MG 2.1  --   --    GFR Estimated Creatinine Clearance: 45.6 ml/min (by C-G formula based on Cr of 0.61). Liver Function Tests:  Recent Labs Lab 05/30/13 1730  AST 31  ALT 36*  ALKPHOS 104  BILITOT 0.5  PROT 7.7  ALBUMIN 4.0    Recent Labs Lab 05/30/13 1730  LIPASE 28   CBC:  Recent Labs Lab 05/30/13 1730 05/31/13 0510  WBC 10.0 8.5  NEUTROABS 6.8  --   HGB 12.7 10.0*  HCT 39.1 30.8*  MCV 87.7 88.0  PLT 343 286   CBG:  Recent Labs Lab 05/31/13 1208 05/31/13 1645 05/31/13 1714 05/31/13 2140 06/01/13 0737  GLUCAP 100* 67* 73 73 125*     Procedures and Diagnostic Studies: Dg Abd Acute W/chest  05/30/2013   *RADIOLOGY REPORT*  Clinical Data: Chronic intra-abdominal distention  ACUTE ABDOMEN SERIES (ABDOMEN 2 VIEW & CHEST 1 VIEW)  Comparison: Most recent 04/02/2013.  Findings:  The hemidiaphragms are elevated.  Within limits of expansion for low lung volumes, the lungs appear clear.  The heart size within normal limits.  There is diffuse and chronic gaseous distention of the colon demonstrated on numerous prior imaging studies.  No evidence for free intraperitoneal air.  There is no small bowel dilatation. Within limits for evaluation, no abnormal intra-abdominal calcifications.  Negative osseous structures.  IMPRESSION: Chronic diffuse colonic dilatation without evidence for free air. No active cardiopulmonary disease.   Original Report Authenticated By: Davonna Belling, M.D.    Scheduled Meds: . amitriptyline  25 mg Oral QHS  . antiseptic oral  rinse  15 mL Mouth Rinse BID  . carvedilol  25 mg Oral BID WC  . enoxaparin (LOVENOX) injection  30 mg Subcutaneous QHS  . insulin aspart  0-5 Units Subcutaneous QHS  . insulin aspart  0-9 Units Subcutaneous TID WC  . lactulose  20 g Oral Daily  . lisinopril  40 mg Oral Daily  . metoCLOPramide  5 mg Oral TID AC & HS  . polyethylene glycol  17 g Oral Daily  . senna  1 tablet Oral BID  . sodium chloride  3 mL Intravenous Q12H   Continuous Infusions:    Time spent: 25 minutes.   LOS: 2 days   Bob Eastwood  [facility] Pager [phone].   *Please note that the hospitalists switch teams on Wednesdays. Please call the flow manager at [phone] if you are having difficulty reaching the hospitalist taking care of this patient as she can update you and provide the most up-to-date pager number of provider caring for the patient. If 8PM-8AM, please contact night-coverage at [email or web address], password [facility]  06/01/2013, 10:18 AM

## 2013-06-01 NOTE — Progress Notes (Signed)
INITIAL NUTRITION ASSESSMENT  Pt meets criteria for severe MALNUTRITION in the context of chronic illness as evidenced by severe muscle wasting in upper arms, hands, and legs in addition to pt with 40% weight loss in the past 9 months per sister's report.  DOCUMENTATION CODES Per approved criteria  -Severe malnutrition in the context of chronic illness   INTERVENTION: - Ensure Complete BID - Will continue to monitor   NUTRITION DIAGNOSIS: Increased nutrient needs related to severe malnutrition as evidenced by nutrition focused physical exam.   Goal: Pt to consume >90% of meals/supplements  Monitor:  Weights, labs, intake  Reason for Assessment: Nutrition risk   62 y.o. female  Admitting Dx: Ileus  ASSESSMENT: Pt known to RD from previous admissions. Pt with history of mental retardation, chronic constipation, frequent hospitalizations for obstipation/ileus has an aggressive home medication regimen for chronic constipation that her sister stays very adamant about comes in with 2 days of no BM and hardly any flatus per sister. Her sister is her primary care giver.   Pt has lost 60 pounds in the past 9 months per sister's report. Sister reports pt eats 4 meals/day plus snacks and drinks Ensure/Boost mixed with ice cream occasionally. She reports pt does not eat red meat but eats softened chicken and fish. Pt's weight down 3 pounds since admission in March of this year. Diet advanced to regular today at lunch. Pt with rectal tube in place and pt with less abdominal distention after soapsuds enema. Rectal tube with 20ml output total yesterday.   Performed nutrition focused physical exam.   Nutrition Focused Physical Exam:  Subcutaneous Fat:  Orbital Region: mild/moderate wasting Upper Arm Region: severe wasting Thoracic and Lumbar Region: NA  Muscle:  Temple Region: mild/moderate wasting Clavicle Bone Region: mild/moderate wasting Clavicle and Acromion Bone Region:  mild/moderate wasting Scapular Bone Region: NA Dorsal Hand: severe wasting Patellar Region: severe wasting Anterior Thigh Region: severe wasting Posterior Calf Region: severe wasting  Edema: None observed    Height: Ht Readings from Last 1 Encounters:  05/30/13 4\' 9"  (1.448 m)    Weight: Wt Readings from Last 1 Encounters:  05/30/13 90 lb 4.8 oz (40.96 kg)    Ideal Body Weight: 95 lb  % Ideal Body Weight: 95  Wt Readings from Last 10 Encounters:  05/30/13 90 lb 4.8 oz (40.96 kg)  04/02/13 87 lb 11.9 oz (39.8 kg)  02/12/13 93 lb 11 oz (42.496 kg)  11/19/12 104 lb 8 oz (47.4 kg)  09/17/12 109 lb 5.6 oz (49.6 kg)  02/27/12 110 lb (49.896 kg)  12/10/11 110 lb 9.6 oz (50.168 kg)  10/24/11 114 lb 10.2 oz (52 kg)    Usual Body Weight: 150 lb 9 months ago  % Usual Body Weight: 60  BMI:  Body mass index is 19.54 kg/(m^2).  Estimated Nutritional Needs: Kcal: 1050-1300 Protein: 50-60g Fluid: 1-1.3L/day  Skin: Intact  Diet Order: General  EDUCATION NEEDS: -No education needs identified at this time   Intake/Output Summary (Last 24 hours) at 06/01/13 1138 Last data filed at 06/01/13 0940  Gross per 24 hour  Intake    360 ml  Output   1770 ml  Net  -1410 ml    Last BM: 6/22  Labs:   Recent Labs Lab 05/30/13 1730 05/31/13 0510 06/01/13 0525  NA 142 140 140  K 3.3* 3.2* 4.2  CL 104 108 106  CO2 26 25 27   BUN 12 9 5*  CREATININE 0.54 0.56 0.61  CALCIUM  9.4 8.7 9.4  MG 2.1  --   --   GLUCOSE 114* 123* 74    CBG (last 3)   Recent Labs  05/31/13 1714 05/31/13 2140 06/01/13 0737  GLUCAP 73 73 125*    Scheduled Meds: . amitriptyline  25 mg Oral QHS  . antiseptic oral rinse  15 mL Mouth Rinse BID  . carvedilol  25 mg Oral BID WC  . enoxaparin (LOVENOX) injection  30 mg Subcutaneous QHS  . insulin aspart  0-5 Units Subcutaneous QHS  . insulin aspart  0-9 Units Subcutaneous TID WC  . lactulose  20 g Oral Daily  . lisinopril  40 mg Oral  Daily  . metoCLOPramide  5 mg Oral TID AC & HS  . polyethylene glycol  17 g Oral Daily  . senna  1 tablet Oral BID  . sodium chloride  3 mL Intravenous Q12H    Continuous Infusions:   Past Medical History  Diagnosis Date  . Hypertension   . Stroke   . Diabetes mellitus   . Chronic constipation     Past Surgical History  Procedure Laterality Date  . No past surgeries       Levon Hedger MS, RD, LDN [phone] Pager [phone] After Hours Pager

## 2013-06-02 DIAGNOSIS — E43 Unspecified severe protein-calorie malnutrition: Secondary | ICD-10-CM | POA: Diagnosis present

## 2013-06-02 LAB — GLUCOSE, CAPILLARY: Glucose-Capillary: 111 mg/dL — ABNORMAL HIGH (ref 70–99)

## 2013-06-02 MED ORDER — LISINOPRIL 40 MG PO TABS: 40.0000 mg | ORAL_TABLET | Freq: Every day | ORAL | Status: DC

## 2013-06-02 MED ORDER — LORAZEPAM 0.5 MG PO TABS: 0.5000 mg | ORAL_TABLET | Freq: Two times a day (BID) | ORAL | Status: DC | PRN

## 2013-06-02 MED ORDER — LACTULOSE 10 GM/15ML PO SOLN: 20.0000 g | Freq: Every day | ORAL | Status: AC

## 2013-06-02 MED ORDER — METOCLOPRAMIDE HCL 5 MG PO TABS: 5.0000 mg | ORAL_TABLET | Freq: Three times a day (TID) | ORAL | Status: DC

## 2013-06-02 MED ORDER — HYDROCODONE-ACETAMINOPHEN 5-325 MG PO TABS: 1.0000 | ORAL_TABLET | Freq: Four times a day (QID) | ORAL | Status: DC | PRN

## 2013-06-02 MED ORDER — SENNA 8.6 MG PO TABS: 1.0000 | ORAL_TABLET | Freq: Two times a day (BID) | ORAL | Status: DC

## 2013-06-02 NOTE — Discharge Summary (Addendum)
Physician Discharge Summary  [name] EAV:[id number] DOB: 09/05/1951 DOA: 05/30/2013  PCP: Paulino Rily, MD  Admit date: 05/30/2013 Discharge date: 06/02/2013  Recommendations for Outpatient Follow-up:  1. F/U with PCP in 6 weeks.  Discharge Diagnoses:  Principal Problem:   Ileus with severe constipation / colonic inertia Active Problems:    Obstipation    HTN (hypertension)    Hypokalemia    Constipation, chronic    DM (diabetes mellitus)    Colonic inertia    Abdominal  pain, other specified site    Mental retardation    Protein-calorie malnutrition, severe   Discharge Condition: Improved.  Diet recommendation: Low-sodium, heart healthy, carbohydrate modified.  History of present illness:  [name] is an 62 y.o. female with a PMH of mental retardation, chronic constipation, frequent hospitalizations for obstipation/ileus who is admitted with her usual obstipation type symptoms on 05/30/2013.  Hospital Course by problem:  Principal Problem:  Ileus with obstipation / colonic inertia / chronic constipation  -Resolved after being given a fleets enema and 2 soap suds enemas with temporary placement of a rectal tube.  -Tolerated advancement of diet. -Continue docusate, MiraLAX, lactulose, Senokot and Reglan.  Active Problems:  Severe protein calorie malnutrition  -Seen and evaluated by dietitian. Encouraged by mouth intake. Hypokalemia  -Resolved s/p K. runs x5.  HTN (hypertension)  -Continue Coreg and lisinopril.  DM (diabetes mellitus)  -Resume metformin at discharge.  Abdominal pain, other specified site  -Secondary to obstipation. Resolved. Mental retardation  -Supportive care.   Procedures:  None.  Consultations:  None.  Discharge Exam: Filed Vitals:   06/02/13 0454  BP: 128/74  Pulse: 62  Temp: 97.8 F (36.6 C)  Resp: 18   Filed Vitals:   06/01/13 0939 06/01/13 1300 06/01/13 2124 06/02/13 0454  BP: 133/70 129/75  94/52 128/74  Pulse:  68 66 62  Temp:  97.9 F (36.6 C) 98.5 F (36.9 C) 97.8 F (36.6 C)  TempSrc:  Oral Oral Oral  Resp:  19 18 18   Height:      Weight:      SpO2:  100% 100% 100%    Gen:  NAD Cardiovascular:  RRR, No M/R/G Respiratory: Lungs CTAB Gastrointestinal: Abdomen soft, NT/ND with normal active bowel sounds. Extremities: No C/E/C   Discharge Instructions  Discharge Orders   Future Orders Complete By Expires     Call MD for:  severe uncontrolled pain  As directed     Call MD for:  As directed     Scheduling Instructions:      Severe constipation, nausea or vomiting.    Diet - low sodium heart healthy  As directed     Diet Carb Modified  As directed     Discharge instructions  As directed     Comments:      You were cared for by Dr. Hillery Aldo  (a hospitalist) during your hospital stay. If you have any questions about your discharge medications or the care you received while you were in the hospital after you are discharged, you can call the unit and ask to speak with the hospitalist on call if the hospitalist that took care of you is not available. Once you are discharged, your primary care physician will handle any further medical issues. Please note that NO REFILLS for any discharge medications will be authorized once you are discharged, as it is imperative that you return to your primary care physician (or establish a relationship with a primary  care physician if you do not have one) for your aftercare needs so that they can reassess your need for medications and monitor your lab values.  Any outstanding tests can be reviewed by your PCP at your follow up visit.  If you do not have a primary care physician, you can call [phone] for a physician referral.  It is highly recommended that you obtain a PCP for hospital follow up.    Increase activity slowly  As directed         Medication List    TAKE these medications       amitriptyline 25 MG tablet  Commonly  known as:  ELAVIL  Take 25 mg by mouth at bedtime.     carvedilol 25 MG tablet  Commonly known as:  COREG  Take 25 mg by mouth 2 (two) times daily with a meal.     DSS 100 MG Caps  Take 100 mg by mouth 2 (two) times daily as needed for constipation.     HYDROcodone-acetaminophen 5-325 MG per tablet  Commonly known as:  NORCO/VICODIN  Take 1 tablet by mouth every 6 (six) hours as needed for pain.     IRON PO  Take 1 tablet by mouth daily.     lactulose 10 GM/15ML solution  Commonly known as:  CHRONULAC  Take 30 mLs (20 g total) by mouth daily.     lisinopril 40 MG tablet  Commonly known as:  PRINIVIL,ZESTRIL  Take 1 tablet (40 mg total) by mouth daily.     LORazepam 0.5 MG tablet  Commonly known as:  ATIVAN  Take 1 tablet (0.5 mg total) by mouth 2 (two) times daily as needed for anxiety (agitation.).     metFORMIN 500 MG tablet  Commonly known as:  GLUCOPHAGE  Take 500 mg by mouth 2 (two) times daily with a meal.     metoCLOPramide 5 MG tablet  Commonly known as:  REGLAN  Take 1 tablet (5 mg total) by mouth 4 (four) times daily -  before meals and at bedtime.     multivitamins ther. w/minerals Tabs  Take 1 tablet by mouth daily.     polyethylene glycol packet  Commonly known as:  MIRALAX / GLYCOLAX  Take 17 g by mouth daily.     senna 8.6 MG Tabs  Commonly known as:  SENOKOT  Take 1 tablet (8.6 mg total) by mouth 2 (two) times daily.           Follow-up Information   Follow up with Paulino Rily, MD.   Contact information:   [address] [place] [address] [phone]        The results of significant diagnostics from this hospitalization (including imaging, microbiology, ancillary and laboratory) are listed below for reference.    Significant Diagnostic Studies: Dg Abd Acute W/chest  05/30/2013   *RADIOLOGY REPORT*  Clinical Data: Chronic intra-abdominal distention  ACUTE ABDOMEN SERIES (ABDOMEN 2 VIEW & CHEST 1 VIEW)  Comparison: Most recent  04/02/2013.  Findings:  The hemidiaphragms are elevated.  Within limits of expansion for low lung volumes, the lungs appear clear.  The heart size within normal limits.  There is diffuse and chronic gaseous distention of the colon demonstrated on numerous prior imaging studies.  No evidence for free intraperitoneal air.  There is no small bowel dilatation. Within limits for evaluation, no abnormal intra-abdominal calcifications.  Negative osseous structures.  IMPRESSION: Chronic diffuse colonic dilatation without evidence for free air. No active  cardiopulmonary disease.   Original Report Authenticated By: Davonna Belling, M.D.    Labs:  Basic Metabolic Panel:  Recent Labs Lab 05/30/13 1730 05/31/13 0510 06/01/13 0525  NA 142 140 140  K 3.3* 3.2* 4.2  CL 104 108 106  CO2 26 25 27   GLUCOSE 114* 123* 74  BUN 12 9 5*  CREATININE 0.54 0.56 0.61  CALCIUM 9.4 8.7 9.4  MG 2.1  --   --    GFR Estimated Creatinine Clearance: 45.6 ml/min (by C-G formula based on Cr of 0.61). Liver Function Tests:  Recent Labs Lab 05/30/13 1730  AST 31  ALT 36*  ALKPHOS 104  BILITOT 0.5  PROT 7.7  ALBUMIN 4.0    Recent Labs Lab 05/30/13 1730  LIPASE 28   CBC:  Recent Labs Lab 05/30/13 1730 05/31/13 0510  WBC 10.0 8.5  NEUTROABS 6.8  --   HGB 12.7 10.0*  HCT 39.1 30.8*  MCV 87.7 88.0  PLT 343 286   CBG:  Recent Labs Lab 06/01/13 0737 06/01/13 1212 06/01/13 1716 06/01/13 2119 06/02/13 0751  GLUCAP 125* 98 113* 123* 111*    Time coordinating discharge: 25 minutes.  Signed:  Kharter Brew  Pager [phone] [facility] 06/02/2013, 9:20 AM

## 2013-06-02 NOTE — Care Management Note (Signed)
Cm spoke with pt's sister Zina at the bedside concerning discharge planning. Pt's sister is HPOA. Family request [facility] and rollator to assist with home care. Pt's sister had questions concerning soap suds enemas. Sister informed enemas are a private cost,insurance does not cover. [facility] ntofied of family's choice of HH services. MD notified of requested order. No tx needed.   Roxy Manns Jonise Weightman,RN,BSN [phone]

## 2013-07-25 ENCOUNTER — Emergency Department (HOSPITAL_COMMUNITY)
Admission: EM | Admit: 2013-07-25 | Discharge: 2013-07-25 | Disposition: A | Discharge status: Home / Self Care | Payer: Medicare Other | Attending: Emergency Medicine | Admitting: Emergency Medicine

## 2013-07-25 ENCOUNTER — Encounter (HOSPITAL_COMMUNITY): Payer: Self-pay | Admitting: Emergency Medicine

## 2013-07-25 DIAGNOSIS — G8929 Other chronic pain: Secondary | ICD-10-CM | POA: Insufficient documentation

## 2013-07-25 DIAGNOSIS — K5900 Constipation, unspecified: Secondary | ICD-10-CM | POA: Insufficient documentation

## 2013-07-25 DIAGNOSIS — R269 Unspecified abnormalities of gait and mobility: Secondary | ICD-10-CM | POA: Insufficient documentation

## 2013-07-25 DIAGNOSIS — M25551 Pain in right hip: Secondary | ICD-10-CM

## 2013-07-25 DIAGNOSIS — Z8673 Personal history of transient ischemic attack (TIA), and cerebral infarction without residual deficits: Secondary | ICD-10-CM | POA: Insufficient documentation

## 2013-07-25 DIAGNOSIS — Z79899 Other long term (current) drug therapy: Secondary | ICD-10-CM | POA: Insufficient documentation

## 2013-07-25 DIAGNOSIS — M25559 Pain in unspecified hip: Secondary | ICD-10-CM | POA: Insufficient documentation

## 2013-07-25 DIAGNOSIS — E119 Type 2 diabetes mellitus without complications: Secondary | ICD-10-CM | POA: Insufficient documentation

## 2013-07-25 DIAGNOSIS — I10 Essential (primary) hypertension: Secondary | ICD-10-CM | POA: Insufficient documentation

## 2013-07-25 MED ORDER — HYDROCODONE-ACETAMINOPHEN 5-325 MG PO TABS: 1.0000 | ORAL_TABLET | ORAL | Status: DC | PRN

## 2013-07-25 NOTE — ED Notes (Signed)
PCG reports pt has right hip pain and has no medicine left.  PCG reports pt has an appt on Thursday with PCP.

## 2013-07-25 NOTE — ED Provider Notes (Signed)
CSN: [id number]     Arrival date & time 07/25/13  1543 History  This chart was scribed for Cynthia Sites, PA, working with Cynthia Roots, MD, by [name] ED Scribe. This patient was seen in room WTR7/WTR7 and the patient's care was started at 4:18 PM.    Chief Complaint  Patient presents with  . Hip Pain    right hip    The history is provided by the patient and a friend. No language interpreter was used.    HPI Comments: [name] is a 62 y.o. Female with a history of Stroke, DM and HTN who was brought by a friend to the Emergency Department complaining of sudden onset, gradually worsening, constant, severe right hip pain that is chronic, but worse since last night. Pt is non-verbal and a friend in the room is providing her history. Pt normally ambulates with a walker. Friend states that pt needs a right hip replacement, but is not a good candidate due to other medical conditions. Friend also states that pt normally takes Norco for this pain, but that she ran out. Pt has an appointment with her PCP in 5 days. No numbness or paresthesias of RLE.  Pt denies back pain, neck pain, fever, chills, nausea, vomiting or any other symptoms. Pt denies any history of smoking an is an occasional wine drinker.  PCP- Dr. Knox [name]   Past Medical History  Diagnosis Date  . Hypertension   . Stroke   . Diabetes mellitus   . Chronic constipation    Past Surgical History  Procedure Laterality Date  . No past surgeries     Family History  Problem Relation Age of Onset  . Diabetes Mother    History  Substance Use Topics  . Smoking status: Never Smoker   . Smokeless tobacco: Never Used  . Alcohol Use: No     Comment: occasional wine   OB History   Grav Para Term Preterm Abortions TAB SAB Ect Mult Living                 Review of Systems  Constitutional: Negative for fever and chills.  HENT: Negative for neck pain.   Gastrointestinal: Negative for nausea and vomiting.   Musculoskeletal: Positive for arthralgias (Right hip) and gait problem. Negative for back pain.  All other systems reviewed and are negative.    Allergies  Review of patient's allergies indicates no known allergies.  Home Medications   Current Outpatient Rx  Name  Route  Sig  Dispense  Refill  . amitriptyline (ELAVIL) 25 MG tablet   Oral   Take 25 mg by mouth at bedtime.         . carvedilol (COREG) 25 MG tablet   Oral   Take 25 mg by mouth 2 (two) times daily with a meal.         . docusate sodium 100 MG CAPS   Oral   Take 100 mg by mouth 2 (two) times daily as needed for constipation.   60 capsule   1   . HYDROcodone-acetaminophen (NORCO/VICODIN) 5-325 MG per tablet   Oral   Take 1 tablet by mouth every 6 (six) hours as needed for pain.   65 tablet   0   . IRON PO   Oral   Take 1 tablet by mouth daily.         Marland Kitchen lactulose (CHRONULAC) 10 GM/15ML solution   Oral   Take 30 mLs (20  g total) by mouth daily.   240 mL   6   . lisinopril (PRINIVIL,ZESTRIL) 40 MG tablet   Oral   Take 1 tablet (40 mg total) by mouth daily.   30 tablet   6   . LORazepam (ATIVAN) 0.5 MG tablet   Oral   Take 1 tablet (0.5 mg total) by mouth 2 (two) times daily as needed for anxiety (agitation.).   60 tablet   1   . metFORMIN (GLUCOPHAGE) 500 MG tablet   Oral   Take 500 mg by mouth 2 (two) times daily with a meal.         . metoCLOPramide (REGLAN) 5 MG tablet   Oral   Take 1 tablet (5 mg total) by mouth 4 (four) times daily -  before meals and at bedtime.   120 tablet   6   . Multiple Vitamins-Minerals (MULTIVITAMINS THER. W/MINERALS) TABS   Oral   Take 1 tablet by mouth daily.           . polyethylene glycol (MIRALAX / GLYCOLAX) packet   Oral   Take 17 g by mouth daily.         Marland Kitchen senna (SENOKOT) 8.6 MG TABS   Oral   Take 1 tablet (8.6 mg total) by mouth 2 (two) times daily.   120 each   0    Triage Vitals: BP 229/118  Pulse 123  Temp(Src) 99.1 F  (37.3 C) (Oral)  Resp 20  SpO2 100%  Physical Exam  Nursing note and vitals reviewed. Constitutional: She is oriented to person, place, and time. No distress.  Thin, frail  HENT:  Head: Normocephalic and atraumatic.  Eyes: Conjunctivae and EOM are normal.  Neck: Normal range of motion. Neck supple.  Cardiovascular: Normal rate, regular rhythm and normal heart sounds.   Pulmonary/Chest: Effort normal and breath sounds normal. No respiratory distress. She has no wheezes.  Musculoskeletal:       Right hip: She exhibits decreased range of motion, decreased strength, tenderness and bony tenderness. She exhibits no swelling, no crepitus, no deformity and no laceration.  Wincing upon palpation to right hip. Unable to ambulate. Distal pulses and sensation intact  Neurological: She is alert and oriented to person, place, and time.  Skin: Skin is warm and dry. She is not diaphoretic.  Psychiatric: She has a normal mood and affect.    ED Course   Procedures (including critical care time)  DIAGNOSTIC STUDIES: Oxygen Saturation is 100% on RA, normal by my interpretation.    COORDINATION OF CARE: 4:28 PM- Pt advised of plan for discharge with a prescription for Norco and pt agrees.   Labs Reviewed - No data to display  No results found.  1. 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 Rx Vicodin. Patient will followup with her primary care physician next week as previously scheduled. Discussed plan with patient, she agreed. Return precautions advised.  I personally performed the services described in this documentation, which was scribed in my presence. The recorded information has been reviewed and is accurate.    Garlon Hatchet, PA-C 07/25/13 1650

## 2013-07-26 ENCOUNTER — Encounter (HOSPITAL_COMMUNITY): Payer: Self-pay | Admitting: Emergency Medicine

## 2013-07-26 ENCOUNTER — Emergency Department (HOSPITAL_COMMUNITY)
Admission: EM | Admit: 2013-07-26 | Discharge: 2013-07-26 | Disposition: A | Discharge status: Home / Self Care | Payer: Medicare Other | Attending: Emergency Medicine | Admitting: Emergency Medicine

## 2013-07-26 DIAGNOSIS — Z8673 Personal history of transient ischemic attack (TIA), and cerebral infarction without residual deficits: Secondary | ICD-10-CM | POA: Insufficient documentation

## 2013-07-26 DIAGNOSIS — I10 Essential (primary) hypertension: Secondary | ICD-10-CM | POA: Insufficient documentation

## 2013-07-26 DIAGNOSIS — K5900 Constipation, unspecified: Secondary | ICD-10-CM | POA: Insufficient documentation

## 2013-07-26 DIAGNOSIS — R141 Gas pain: Secondary | ICD-10-CM | POA: Insufficient documentation

## 2013-07-26 DIAGNOSIS — R142 Eructation: Secondary | ICD-10-CM | POA: Insufficient documentation

## 2013-07-26 DIAGNOSIS — E119 Type 2 diabetes mellitus without complications: Secondary | ICD-10-CM | POA: Insufficient documentation

## 2013-07-26 DIAGNOSIS — R143 Flatulence: Secondary | ICD-10-CM | POA: Insufficient documentation

## 2013-07-26 DIAGNOSIS — Z8659 Personal history of other mental and behavioral disorders: Secondary | ICD-10-CM | POA: Insufficient documentation

## 2013-07-26 DIAGNOSIS — Z79899 Other long term (current) drug therapy: Secondary | ICD-10-CM | POA: Insufficient documentation

## 2013-07-26 LAB — URINE MICROSCOPIC-ADD ON

## 2013-07-26 LAB — URINALYSIS, ROUTINE W REFLEX MICROSCOPIC
Glucose, UA: NEGATIVE mg/dL
Hgb urine dipstick: NEGATIVE
Protein, ur: NEGATIVE mg/dL
Urobilinogen, UA: 1 mg/dL (ref 0.0–1.0)

## 2013-07-26 MED ORDER — CARVEDILOL 25 MG PO TABS
25.0000 mg | ORAL_TABLET | Freq: Once | ORAL | Status: DC
  Filled 2013-07-26: qty 1

## 2013-07-26 MED ORDER — CARVEDILOL 25 MG PO TABS
25.0000 mg | ORAL_TABLET | Freq: Two times a day (BID) | ORAL | Status: DC
  Filled 2013-07-26: qty 1

## 2013-07-26 MED ORDER — LISINOPRIL 40 MG PO TABS
40.0000 mg | ORAL_TABLET | Freq: Once | ORAL | Status: DC
  Filled 2013-07-26: qty 1

## 2013-07-26 NOTE — ED Provider Notes (Signed)
Medical screening examination/treatment/procedure(s) were performed by non-physician practitioner and as supervising physician I was immediately available for consultation/collaboration.   Suzi Roots, MD 07/26/13 1310

## 2013-07-26 NOTE — ED Notes (Signed)
Sisters report that patient has been noncomplaint with her blood pressure medication for the last 3 days. Patient's blood pressure is 216/102 in triage. Sister is also reporting that patient's colon has not worked for the last 2 years. Sister reports having to give the patient a soap suds enema every other day. Pt last had a soap sud enema on Thursday. Family denies any nausea or vomiting. Family reports that patient has had a gait change and has fallen twice in the last few days. Pt has garbled speech which is normal.

## 2013-07-26 NOTE — ED Provider Notes (Signed)
CSN: [id number]     Arrival date & time 07/26/13  1917 History     First MD Initiated Contact with Patient 07/26/13 1934     Chief Complaint  Patient presents with  . Hypertension  . Bloated   (Consider location/radiation/quality/duration/timing/severity/associated sxs/prior Treatment) HPI  62 year old female with history of mental retardation, hypertension, strokes, diabetes, and chronic constipation presents complaining of high blood pressure, and constipation.  History obtained through sister who is at bedside. Patient has a history of colonic delay emptying, and usually requires soap sud enema every other day performed by her sister. Last episode enema was 3 days ago. Patient refused enema recently and her sister has notice increase abdominal bloating because of it.  Sister request help with enema to help evacuate stool.  Pt otherwise without fever, vomiting, abdominal pain, or rash.  Sister also mentioned pt has been avoiding taking her BP medication.  Sister usually crushed up the BP medication and put it in her food, however pt has avoid eating the food that has medication.  But she is eating other food without problem.  No other complaints except reports of pt falling while walking at home.  Pt has garble speech, but this is her baseline.  NO other neuro changes noted by family.  Pt normally non verbal, but does understand simple questions.        Past Medical History  Diagnosis Date  . Hypertension   . Stroke   . Diabetes mellitus   . Chronic constipation    Past Surgical History  Procedure Laterality Date  . No past surgeries     Family History  Problem Relation Age of Onset  . Diabetes Mother    History  Substance Use Topics  . Smoking status: Never Smoker   . Smokeless tobacco: Never Used  . Alcohol Use: No     Comment: occasional wine   OB History   Grav Para Term Preterm Abortions TAB SAB Ect Mult Living                 Review of Systems  Unable to  perform ROS: Patient nonverbal    Allergies  Review of patient's allergies indicates no known allergies.  Home Medications   Current Outpatient Rx  Name  Route  Sig  Dispense  Refill  . amitriptyline (ELAVIL) 25 MG tablet   Oral   Take 25 mg by mouth at bedtime.         . carvedilol (COREG) 25 MG tablet   Oral   Take 25 mg by mouth 2 (two) times daily with a meal.         . docusate sodium 100 MG CAPS   Oral   Take 100 mg by mouth 2 (two) times daily as needed for constipation.   60 capsule   1   . HYDROcodone-acetaminophen (NORCO/VICODIN) 5-325 MG per tablet   Oral   Take 1 tablet by mouth every 6 (six) hours as needed for pain.   65 tablet   0   . HYDROcodone-acetaminophen (NORCO/VICODIN) 5-325 MG per tablet   Oral   Take 1 tablet by mouth every 4 (four) hours as needed for pain.   15 tablet   0   . IRON PO   Oral   Take 1 tablet by mouth daily.         Marland Kitchen lactulose (CHRONULAC) 10 GM/15ML solution   Oral   Take 30 mLs (20 g total) by mouth daily.  240 mL   6   . lisinopril (PRINIVIL,ZESTRIL) 40 MG tablet   Oral   Take 1 tablet (40 mg total) by mouth daily.   30 tablet   6   . LORazepam (ATIVAN) 0.5 MG tablet   Oral   Take 1 tablet (0.5 mg total) by mouth 2 (two) times daily as needed for anxiety (agitation.).   60 tablet   1   . metFORMIN (GLUCOPHAGE) 500 MG tablet   Oral   Take 500 mg by mouth 2 (two) times daily with a meal.         . metoCLOPramide (REGLAN) 5 MG tablet   Oral   Take 1 tablet (5 mg total) by mouth 4 (four) times daily -  before meals and at bedtime.   120 tablet   6   . Multiple Vitamins-Minerals (MULTIVITAMINS THER. W/MINERALS) TABS   Oral   Take 1 tablet by mouth daily.           . polyethylene glycol (MIRALAX / GLYCOLAX) packet   Oral   Take 17 g by mouth daily.         Marland Kitchen senna (SENOKOT) 8.6 MG TABS   Oral   Take 1 tablet (8.6 mg total) by mouth 2 (two) times daily.   120 each   0    BP 216/102   Pulse 97  Temp(Src) 98 F (36.7 C) (Oral)  Resp 18  SpO2 100% Physical Exam  Nursing note and vitals reviewed. Constitutional:  Ill-appearing female appears in no acute distress  HENT:  Head: Atraumatic.  Mouth/Throat: Oropharynx is clear and moist.  Eyes: Conjunctivae are normal.  Neck: Neck supple.  Cardiovascular: Normal rate and regular rhythm.   Pulmonary/Chest: Effort normal and breath sounds normal.  Abdominal:  Protuberant abdomen, soft, nontender  Musculoskeletal: She exhibits no edema.  Neurological: She is alert.  Skin: Skin is warm. No rash noted.  Psychiatric: She has a normal mood and affect.    ED Course   Procedures (including critical care time)  Pt with HTN, BP 216/102, not taking her usual coreg and lisinopril at home.  Will restart medication here.  Also has constipation, chronic in nature usually requiring soap sud enema, will give bowel prep.  Report having recurrent fall, no evidence of trauma on exam. Will check UA to r/o UTI.  Care discussed with attending.    Patient refused to take the by mouth medication here. She has no complaints of symptoms suggestive of acute organ injury secondary to high blood pressure. However, difficult to obtain information due to history of mental retardation. Her blood pressure however improves to 180s systolic without any specific treatment.  Labs Reviewed - No data to display No results found. 1. Hypertension   2. Constipation     MDM  BP 171/75  Pulse 85  Temp(Src) 98 F (36.7 C) (Oral)  Resp 18  SpO2 100%   Fayrene Helper, PA-C 07/26/13 2245

## 2013-07-27 NOTE — ED Provider Notes (Signed)
Medical screening examination/treatment/procedure(s) were conducted as a shared visit with non-physician practitioner(s) and myself.  I personally evaluated the patient during the encounter  Pt with MR presents with abdominal bloating and constipation.  Also noted to be hypertensive likely d/t medication noncompliance.  Patient nontoxic.  Treated with enema with relief.  Will follow-up with PCP.  After history, exam, and medical workup I feel the patient has been appropriately medically screened and is safe for discharge home. Pertinent diagnoses were discussed with the patient. Patient was given return precautions.  Shon Baton, MD 07/27/13 [phone]

## 2013-09-04 ENCOUNTER — Encounter (HOSPITAL_COMMUNITY): Payer: Self-pay | Admitting: Emergency Medicine

## 2013-09-04 ENCOUNTER — Emergency Department (HOSPITAL_COMMUNITY)
Admission: EM | Admit: 2013-09-04 | Discharge: 2013-09-05 | Disposition: A | Discharge status: Home / Self Care | Payer: Medicare Other | Attending: Emergency Medicine | Admitting: Emergency Medicine

## 2013-09-04 DIAGNOSIS — I10 Essential (primary) hypertension: Secondary | ICD-10-CM | POA: Insufficient documentation

## 2013-09-04 DIAGNOSIS — R141 Gas pain: Secondary | ICD-10-CM | POA: Insufficient documentation

## 2013-09-04 DIAGNOSIS — Z8673 Personal history of transient ischemic attack (TIA), and cerebral infarction without residual deficits: Secondary | ICD-10-CM | POA: Insufficient documentation

## 2013-09-04 DIAGNOSIS — R142 Eructation: Secondary | ICD-10-CM | POA: Insufficient documentation

## 2013-09-04 DIAGNOSIS — R350 Frequency of micturition: Secondary | ICD-10-CM | POA: Insufficient documentation

## 2013-09-04 DIAGNOSIS — Z91199 Patient's noncompliance with other medical treatment and regimen due to unspecified reason: Secondary | ICD-10-CM | POA: Insufficient documentation

## 2013-09-04 DIAGNOSIS — IMO0002 Reserved for concepts with insufficient information to code with codable children: Secondary | ICD-10-CM | POA: Insufficient documentation

## 2013-09-04 DIAGNOSIS — Z9119 Patient's noncompliance with other medical treatment and regimen: Secondary | ICD-10-CM | POA: Insufficient documentation

## 2013-09-04 DIAGNOSIS — E119 Type 2 diabetes mellitus without complications: Secondary | ICD-10-CM | POA: Insufficient documentation

## 2013-09-04 DIAGNOSIS — Z79899 Other long term (current) drug therapy: Secondary | ICD-10-CM | POA: Insufficient documentation

## 2013-09-04 DIAGNOSIS — K5900 Constipation, unspecified: Secondary | ICD-10-CM | POA: Insufficient documentation

## 2013-09-04 LAB — CBC WITH DIFFERENTIAL/PLATELET
Basophils Absolute: 0 10*3/uL (ref 0.0–0.1)
Eosinophils Relative: 2 % (ref 0–5)
HCT: 37.7 % (ref 36.0–46.0)
Lymphocytes Relative: 40 % (ref 12–46)
Lymphs Abs: 3.3 10*3/uL (ref 0.7–4.0)
MCV: 88.7 fL (ref 78.0–100.0)
Neutro Abs: 3.9 10*3/uL (ref 1.7–7.7)
Platelets: 274 10*3/uL (ref 150–400)
RBC: 4.25 MIL/uL (ref 3.87–5.11)
WBC: 8.3 10*3/uL (ref 4.0–10.5)

## 2013-09-04 LAB — URINE MICROSCOPIC-ADD ON

## 2013-09-04 LAB — URINALYSIS, ROUTINE W REFLEX MICROSCOPIC
Glucose, UA: NEGATIVE mg/dL
Hgb urine dipstick: NEGATIVE
Specific Gravity, Urine: 1.028 (ref 1.005–1.030)
pH: 5.5 (ref 5.0–8.0)

## 2013-09-04 LAB — BASIC METABOLIC PANEL
CO2: 29 mEq/L (ref 19–32)
Calcium: 9.7 mg/dL (ref 8.4–10.5)
Chloride: 104 mEq/L (ref 96–112)
Glucose, Bld: 125 mg/dL — ABNORMAL HIGH (ref 70–99)
Potassium: 3.8 mEq/L (ref 3.5–5.1)
Sodium: 142 mEq/L (ref 135–145)

## 2013-09-04 NOTE — ED Provider Notes (Signed)
CSN: [id number]     Arrival date & time 09/04/13  2126 History   First MD Initiated Contact with Patient 09/04/13 2234     Chief Complaint  Patient presents with  . Hypertension   (Consider location/radiation/quality/duration/timing/severity/associated sxs/prior Treatment) Patient is a 62 y.o. female presenting with hypertension. The history is provided by a relative. No language interpreter was used.  Hypertension This is a chronic problem. The problem has been gradually worsening.   62 year old female with history of mental retardation, hypertension, strokes, diabetes, and chronic constipation presents with her sister who is reporting patient's blood pressure is high, and is bloated due to constipation. History obtained through her sister. Patient has a history of colonic delay emptying, and usually requires soap sud enema every other day performed by her sister.  Patient has refused her enema the last few days. Sister also mentioned pt has been avoiding taking her BP medication. Sister usually crushes the BP medication and adds it to her food, however pt has been avoiding eating the food that has the medication added.    Sister also reports patient with urinary frequency over the last several days.  Sister reports the patient is becoming more stubborn as time progresses.    Past Medical History  Diagnosis Date  . Hypertension   . Diabetes mellitus   . Chronic constipation   . Stroke     at 62 years old   Past Surgical History  Procedure Laterality Date  . No past surgeries     Family History  Problem Relation Age of Onset  . Diabetes Mother    History  Substance Use Topics  . Smoking status: Never Smoker   . Smokeless tobacco: Never Used  . Alcohol Use: No     Comment: occasional wine   OB History   Grav Para Term Preterm Abortions TAB SAB Ect Mult Living                 Review of Systems  Gastrointestinal: Positive for constipation.  Genitourinary: Positive for  frequency.  Psychiatric/Behavioral: Positive for agitation.  All other systems reviewed and are negative.    Allergies  Review of patient's allergies indicates no known allergies.  Home Medications   Current Outpatient Rx  Name  Route  Sig  Dispense  Refill  . amitriptyline (ELAVIL) 25 MG tablet   Oral   Take 25 mg by mouth at bedtime.         Marland Kitchen amLODipine (NORVASC) 10 MG tablet   Oral   Take 20 mg by mouth daily.         . carvedilol (COREG) 25 MG tablet   Oral   Take 25 mg by mouth 2 (two) times daily with a meal.         . docusate sodium 100 MG CAPS   Oral   Take 100 mg by mouth 2 (two) times daily as needed for constipation.   60 capsule   1   . ferrous sulfate 325 (65 FE) MG tablet   Oral   Take 325 mg by mouth daily with breakfast.         . HYDROcodone-acetaminophen (NORCO/VICODIN) 5-325 MG per tablet   Oral   Take 1 tablet by mouth every 4 (four) hours as needed for pain.   15 tablet   0   . lactulose (CHRONULAC) 10 GM/15ML solution   Oral   Take 30 mLs (20 g total) by mouth daily.   240 mL  6   . LORazepam (ATIVAN) 0.5 MG tablet   Oral   Take 1 tablet (0.5 mg total) by mouth 2 (two) times daily as needed for anxiety (agitation.).   60 tablet   1   . metoCLOPramide (REGLAN) 5 MG tablet   Oral   Take 1 tablet (5 mg total) by mouth 4 (four) times daily -  before meals and at bedtime.   120 tablet   6   . Multiple Vitamins-Minerals (MULTIVITAMINS THER. W/MINERALS) TABS   Oral   Take 1 tablet by mouth daily.           . polyethylene glycol (MIRALAX / GLYCOLAX) packet   Oral   Take 17 g by mouth daily.         Marland Kitchen senna (SENOKOT) 8.6 MG TABS   Oral   Take 1 tablet (8.6 mg total) by mouth 2 (two) times daily.   120 each   0    BP 171/86  Pulse 86  Temp(Src) 98.9 F (37.2 C) (Oral)  Resp 20  Ht 4\' 9"  (1.448 m)  Wt 84 lb (38.102 kg)  BMI 18.17 kg/m2 Physical Exam  Nursing note and vitals reviewed. Constitutional: She  appears well-developed.  HENT:  Head: Normocephalic.  Neck: Normal range of motion.  Cardiovascular: Normal rate and regular rhythm.   Pulmonary/Chest: Effort normal and breath sounds normal.  Abdominal: Soft. She exhibits distension.  Musculoskeletal: She exhibits no edema.  Lymphadenopathy:    She has no cervical adenopathy.  Neurological: She is alert.  Garbled speech as normal for patient.  Skin: Skin is warm and dry.  Psychiatric:  Currently cooperative with exam.      ED Course  Procedures (including critical care time) Labs Review Labs Reviewed - No data to display Imaging Review No results found. Lab results reviewed and shared with patient's caretaker. Discussed patient with Dr. Jeraldine Loots.  Will start on clonidine patch for blood pressure control. Patient has an appointment with a new primary care provider on Monday. MDM  Hypertension.    Jimmye Norman, NP 09/05/13 [phone]

## 2013-09-04 NOTE — ED Notes (Signed)
Pt here for HTN, sister reports pt has been noncompliant/hiding medication.

## 2013-09-05 MED ORDER — CLONIDINE HCL 0.1 MG/24HR TD PTWK
0.1000 mg | MEDICATED_PATCH | TRANSDERMAL | Status: DC
  Administered 2013-09-05: 0.1 mg via TRANSDERMAL
  Filled 2013-09-05: qty 1

## 2013-09-05 MED ORDER — HYDROCODONE-ACETAMINOPHEN 5-325 MG PO TABS: 1.0000 | ORAL_TABLET | Freq: Three times a day (TID) | ORAL | Status: DC | PRN

## 2013-09-05 NOTE — ED Notes (Signed)
Patient is alert and oriented x3.  She was given DC instructions and follow up visit instructions.  Patient gave verbal understanding. She was DC ambulatory under her own power to home.  V/S stable.  He was not showing any signs of distress on DC 

## 2013-09-05 NOTE — ED Provider Notes (Signed)
  Medical screening examination/treatment/procedure(s) were performed by non-physician practitioner and as supervising physician I was immediately available for consultation/collaboration.    Gerhard Munch, MD 09/05/13 1515

## 2014-03-17 ENCOUNTER — Emergency Department (HOSPITAL_COMMUNITY): Payer: Medicare Other

## 2014-03-17 ENCOUNTER — Encounter (HOSPITAL_COMMUNITY): Payer: Self-pay | Admitting: Emergency Medicine

## 2014-03-17 ENCOUNTER — Inpatient Hospital Stay (HOSPITAL_COMMUNITY)
Admission: EM | Admit: 2014-03-17 | Discharge: 2014-03-20 | DRG: 392 | Disposition: A | Discharge status: Home / Self Care | Payer: Medicare Other | Attending: Internal Medicine | Admitting: Internal Medicine

## 2014-03-17 DIAGNOSIS — K599 Functional intestinal disorder, unspecified: Secondary | ICD-10-CM | POA: Diagnosis present

## 2014-03-17 DIAGNOSIS — K567 Ileus, unspecified: Secondary | ICD-10-CM | POA: Diagnosis present

## 2014-03-17 DIAGNOSIS — K5900 Constipation, unspecified: Principal | ICD-10-CM | POA: Diagnosis present

## 2014-03-17 DIAGNOSIS — I10 Essential (primary) hypertension: Secondary | ICD-10-CM | POA: Diagnosis present

## 2014-03-17 DIAGNOSIS — Z7401 Bed confinement status: Secondary | ICD-10-CM

## 2014-03-17 DIAGNOSIS — Z79899 Other long term (current) drug therapy: Secondary | ICD-10-CM

## 2014-03-17 DIAGNOSIS — E119 Type 2 diabetes mellitus without complications: Secondary | ICD-10-CM

## 2014-03-17 DIAGNOSIS — K5909 Other constipation: Secondary | ICD-10-CM | POA: Diagnosis present

## 2014-03-17 DIAGNOSIS — R451 Restlessness and agitation: Secondary | ICD-10-CM | POA: Diagnosis present

## 2014-03-17 DIAGNOSIS — R112 Nausea with vomiting, unspecified: Secondary | ICD-10-CM | POA: Diagnosis present

## 2014-03-17 DIAGNOSIS — R109 Unspecified abdominal pain: Secondary | ICD-10-CM

## 2014-03-17 DIAGNOSIS — K560 Paralytic ileus: Secondary | ICD-10-CM | POA: Diagnosis present

## 2014-03-17 DIAGNOSIS — F79 Unspecified intellectual disabilities: Secondary | ICD-10-CM | POA: Diagnosis present

## 2014-03-17 DIAGNOSIS — Z8673 Personal history of transient ischemic attack (TIA), and cerebral infarction without residual deficits: Secondary | ICD-10-CM

## 2014-03-17 DIAGNOSIS — K5989 Other specified functional intestinal disorders: Secondary | ICD-10-CM | POA: Diagnosis present

## 2014-03-17 DIAGNOSIS — IMO0002 Reserved for concepts with insufficient information to code with codable children: Secondary | ICD-10-CM | POA: Diagnosis present

## 2014-03-17 DIAGNOSIS — K598 Other specified functional intestinal disorders: Secondary | ICD-10-CM

## 2014-03-17 DIAGNOSIS — Z833 Family history of diabetes mellitus: Secondary | ICD-10-CM

## 2014-03-17 NOTE — ED Provider Notes (Signed)
CSN: [id number]     Arrival date & time 03/17/14  1925 History   First MD Initiated Contact with Patient 03/17/14 2300     Chief Complaint  Patient presents with  . Emesis     (Consider location/radiation/quality/duration/timing/severity/associated sxs/prior Treatment) HPI 63 year old female presents to emergency department with her sister with reported vomiting.  Patient has history of colonic ileus, frequently, has admissions to the hospital for cleanout.  Sister reports that she has been on her lactulose, MiraLAX, and every three-day and otherwise as usual.  Today was an enema day, patient had vomiting after breakfast, which sister reports usually indicates obstruction.  The enema did not help.  Patient has history of stroke at age 89, has mental delay.  Her blood pressure has been elevated over the last few days, sister, reports primary care doctor recently changed her clonidine dosing.  No fevers no chills.  Patient has not had any complaints.  Patient sister reports patient's abdomen is slightly more distended than usual Past Medical History  Diagnosis Date  . Hypertension   . Diabetes mellitus   . Chronic constipation   . Stroke     at 63 years old   Past Surgical History  Procedure Laterality Date  . No past surgeries     Family History  Problem Relation Age of Onset  . Diabetes Mother    History  Substance Use Topics  . Smoking status: Never Smoker   . Smokeless tobacco: Never Used  . Alcohol Use: No     Comment: occasional wine   OB History   Grav Para Term Preterm Abortions TAB SAB Ect Mult Living                 Review of Systems  Unable to perform ROS: Dementia      Allergies  Review of patient's allergies indicates no known allergies.  Home Medications   Current Outpatient Rx  Name  Route  Sig  Dispense  Refill  . cloNIDine (CATAPRES - DOSED IN MG/24 HR) 0.2 mg/24hr patch   Transdermal   Place 0.2 mg onto the skin once a week.         Marland Kitchen.  HYDROcodone-acetaminophen (NORCO/VICODIN) 5-325 MG per tablet   Oral   Take 1 tablet by mouth every 8 (eight) hours as needed for pain.   6 tablet   0   . lactulose (CHRONULAC) 10 GM/15ML solution   Oral   Take 30 mLs (20 g total) by mouth daily.   240 mL   6   . LORazepam (ATIVAN) 0.5 MG tablet   Oral   Take 0.5 mg by mouth 2 (two) times daily as needed for anxiety (agitation.).         Marland Kitchen. Multiple Vitamins-Minerals (MULTIVITAMINS THER. W/MINERALS) TABS   Oral   Take 1 tablet by mouth daily.           . polyethylene glycol (MIRALAX / GLYCOLAX) packet   Oral   Take 17 g by mouth daily.          BP 181/91  Pulse 99  Temp(Src) 97.8 F (36.6 C) (Oral)  Resp 18  Ht 4\' 9"  (1.448 m)  Wt 94 lb (42.638 kg)  BMI 20.34 kg/m2  SpO2 100% Physical Exam  Nursing note and vitals reviewed. Constitutional: She appears well-developed and well-nourished. No distress.  HENT:  Head: Normocephalic and atraumatic.  Nose: Nose normal.  Mouth/Throat: Oropharynx is clear and moist.  Eyes: Conjunctivae and EOM are  normal. Pupils are equal, round, and reactive to light.  Neck: Normal range of motion. Neck supple. No JVD present. No tracheal deviation present. No thyromegaly present.  Cardiovascular: Normal rate, regular rhythm, normal heart sounds and intact distal pulses.  Exam reveals no gallop and no friction rub.   No murmur heard. Pulmonary/Chest: Effort normal and breath sounds normal. No stridor. No respiratory distress. She has no wheezes. She has no rales. She exhibits no tenderness.  Abdominal: Soft. She exhibits distension. She exhibits no mass. There is no tenderness. There is no rebound and no guarding.  Decreased bowel sounds  Musculoskeletal: Normal range of motion. She exhibits no edema and no tenderness.  Lymphadenopathy:    She has no cervical adenopathy.  Neurological: She is alert. She exhibits normal muscle tone. Coordination normal.  Skin: Skin is warm and dry. No  rash noted. No erythema. No pallor.  Psychiatric: She has a normal mood and affect. Her behavior is normal. Judgment and thought content normal.    ED Course  Procedures (including critical care time) Labs Review Labs Reviewed - No data to display Imaging Review Dg Abd Acute W/chest  03/17/2014   CLINICAL DATA:  Vomiting and abdominal distention.  EXAM: ACUTE ABDOMEN SERIES (ABDOMEN 2 VIEW & CHEST 1 VIEW)  COMPARISON:  05/30/2013  FINDINGS: Lungs are hypoinflated and otherwise clear. Cardiomediastinal silhouette and remainder of the chest is unchanged.  Examination demonstrates a moderately air distended colon unchanged from the prior exam there is no definite free air. There is mild fecal retention throughout the colon. 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  IMPRESSION: Findings compatible with chronic colonic ileus.  No acute cardiopulmonary disease.   Electronically Signed   By: Elberta Fortis M.D.   On: 03/17/2014 23:54     EKG Interpretation None      MDM   Final diagnoses:  Colonic inertia  Constipation  Ileus  Obstipation    63 year old female with colonic ileus.  Will attempt to relieve some of her abdominal distention with a soapsuds enema here in the emergency department, although she usually does require admission to the hospital with a rectal tube for her chronic constipation.    Olivia Mackie, MD 03/18/14 [phone]

## 2014-03-17 NOTE — ED Notes (Signed)
Caretaker reports pt has a colon hx problem where she has to gives pt enemas every 3 days or so. Pt was eating breakfast this morning and started to vomit and gave pt an enema to see if it would help with a possible blockage. Caretaker states that the enema did not help and pt has gas. Pt has mental delay d/t to stroke at 4. Pt alert and watching tv. BP elevated. Caretaker states that pt has patch changed recently and BP was normal this morning. Pt ambulatory.

## 2014-03-18 ENCOUNTER — Encounter (HOSPITAL_COMMUNITY): Payer: Self-pay | Admitting: *Deleted

## 2014-03-18 DIAGNOSIS — K598 Other specified functional intestinal disorders: Secondary | ICD-10-CM

## 2014-03-18 DIAGNOSIS — K5900 Constipation, unspecified: Principal | ICD-10-CM

## 2014-03-18 DIAGNOSIS — R112 Nausea with vomiting, unspecified: Secondary | ICD-10-CM | POA: Diagnosis present

## 2014-03-18 DIAGNOSIS — R109 Unspecified abdominal pain: Secondary | ICD-10-CM

## 2014-03-18 DIAGNOSIS — K5989 Other specified functional intestinal disorders: Secondary | ICD-10-CM

## 2014-03-18 DIAGNOSIS — E119 Type 2 diabetes mellitus without complications: Secondary | ICD-10-CM

## 2014-03-18 DIAGNOSIS — F79 Unspecified intellectual disabilities: Secondary | ICD-10-CM

## 2014-03-18 DIAGNOSIS — I10 Essential (primary) hypertension: Secondary | ICD-10-CM

## 2014-03-18 DIAGNOSIS — K560 Paralytic ileus: Secondary | ICD-10-CM

## 2014-03-18 LAB — URINALYSIS, ROUTINE W REFLEX MICROSCOPIC
Bilirubin Urine: NEGATIVE
Glucose, UA: NEGATIVE mg/dL
Ketones, ur: NEGATIVE mg/dL
NITRITE: NEGATIVE
Protein, ur: NEGATIVE mg/dL
SPECIFIC GRAVITY, URINE: 1.009 (ref 1.005–1.030)
UROBILINOGEN UA: 1 mg/dL (ref 0.0–1.0)
pH: 7 (ref 5.0–8.0)

## 2014-03-18 LAB — CBC
HCT: 36 % (ref 36.0–46.0)
Hemoglobin: 11.2 g/dL — ABNORMAL LOW (ref 12.0–15.0)
MCH: 28.2 pg (ref 26.0–34.0)
MCHC: 31.1 g/dL (ref 30.0–36.0)
MCV: 90.7 fL (ref 78.0–100.0)
PLATELETS: 238 10*3/uL (ref 150–400)
RBC: 3.97 MIL/uL (ref 3.87–5.11)
RDW: 14.2 % (ref 11.5–15.5)
WBC: 7.7 10*3/uL (ref 4.0–10.5)

## 2014-03-18 LAB — CBC WITH DIFFERENTIAL/PLATELET
Basophils Absolute: 0 10*3/uL (ref 0.0–0.1)
Basophils Relative: 0 % (ref 0–1)
EOS ABS: 0.1 10*3/uL (ref 0.0–0.7)
Eosinophils Relative: 1 % (ref 0–5)
HCT: 38.9 % (ref 36.0–46.0)
Hemoglobin: 12.6 g/dL (ref 12.0–15.0)
LYMPHS ABS: 2.6 10*3/uL (ref 0.7–4.0)
Lymphocytes Relative: 28 % (ref 12–46)
MCH: 29.3 pg (ref 26.0–34.0)
MCHC: 32.4 g/dL (ref 30.0–36.0)
MCV: 90.5 fL (ref 78.0–100.0)
Monocytes Absolute: 0.8 10*3/uL (ref 0.1–1.0)
Monocytes Relative: 8 % (ref 3–12)
NEUTROS ABS: 5.9 10*3/uL (ref 1.7–7.7)
NEUTROS PCT: 63 % (ref 43–77)
Platelets: 281 10*3/uL (ref 150–400)
RBC: 4.3 MIL/uL (ref 3.87–5.11)
RDW: 14.2 % (ref 11.5–15.5)
WBC: 9.3 10*3/uL (ref 4.0–10.5)

## 2014-03-18 LAB — BASIC METABOLIC PANEL
BUN: 17 mg/dL (ref 6–23)
BUN: 16 mg/dL (ref 6–23)
CALCIUM: 8.6 mg/dL (ref 8.4–10.5)
CHLORIDE: 104 meq/L (ref 96–112)
CHLORIDE: 108 meq/L (ref 96–112)
CO2: 27 mEq/L (ref 19–32)
CO2: 25 mEq/L (ref 19–32)
CREATININE: 0.55 mg/dL (ref 0.50–1.10)
Calcium: 9.8 mg/dL (ref 8.4–10.5)
Creatinine, Ser: 0.62 mg/dL (ref 0.50–1.10)
GFR calc non Af Amer: >90 mL/min (ref >90)
GFR calc non Af Amer: >90 mL/min (ref >90)
GLUCOSE: 76 mg/dL (ref 70–99)
Glucose, Bld: 88 mg/dL (ref 70–99)
POTASSIUM: 3.9 meq/L (ref 3.7–5.3)
Potassium: 4.3 mEq/L (ref 3.7–5.3)
SODIUM: 142 meq/L (ref 137–147)
Sodium: 142 mEq/L (ref 137–147)

## 2014-03-18 LAB — URINE MICROSCOPIC-ADD ON

## 2014-03-18 LAB — MAGNESIUM: MAGNESIUM: 2.1 mg/dL (ref 1.5–2.5)

## 2014-03-18 MED ORDER — LORAZEPAM 0.5 MG PO TABS: 0.5000 mg | ORAL_TABLET | Freq: Two times a day (BID) | ORAL | Status: DC | PRN

## 2014-03-18 MED ORDER — LACTULOSE 10 GM/15ML PO SOLN
20.0000 g | Freq: Two times a day (BID) | ORAL | Status: DC
  Administered 2014-03-18 – 2014-03-20 (×4): 20 g via ORAL
  Filled 2014-03-18 (×5): qty 30

## 2014-03-18 MED ORDER — ENSURE COMPLETE PO LIQD
237.0000 mL | Freq: Two times a day (BID) | ORAL | Status: DC
  Administered 2014-03-19: 237 mL via ORAL

## 2014-03-18 MED ORDER — LORAZEPAM 0.5 MG PO TABS
0.5000 mg | ORAL_TABLET | Freq: Two times a day (BID) | ORAL | Status: DC | PRN
Start: 2014-03-18 — End: 2014-03-20
  Filled 2014-03-18 (×2): qty 1

## 2014-03-18 MED ORDER — LORAZEPAM 2 MG/ML IJ SOLN
1.0000 mg | Freq: Once | INTRAMUSCULAR | Status: AC
  Administered 2014-03-18: 1 mg via INTRAVENOUS
  Filled 2014-03-18: qty 1

## 2014-03-18 MED ORDER — HYDROCODONE-ACETAMINOPHEN 5-325 MG PO TABS
1.0000 | ORAL_TABLET | Freq: Three times a day (TID) | ORAL | Status: DC | PRN
  Administered 2014-03-18 – 2014-03-19 (×2): 1 via ORAL
  Filled 2014-03-18 (×2): qty 1

## 2014-03-18 MED ORDER — CLONIDINE HCL 0.1 MG PO TABS
0.2000 mg | ORAL_TABLET | Freq: Once | ORAL | Status: AC
  Administered 2014-03-18: 0.2 mg via ORAL
  Filled 2014-03-18: qty 2

## 2014-03-18 MED ORDER — MAGNESIUM CITRATE PO SOLN
1.0000 | Freq: Once | ORAL | Status: AC
  Administered 2014-03-18: 1 via ORAL
  Filled 2014-03-18: qty 296

## 2014-03-18 MED ORDER — CLONIDINE HCL 0.2 MG/24HR TD PTWK
0.2000 mg | MEDICATED_PATCH | TRANSDERMAL | Status: DC
  Administered 2014-03-18: 0.2 mg via TRANSDERMAL
  Filled 2014-03-18: qty 1

## 2014-03-18 MED ORDER — METOCLOPRAMIDE HCL 5 MG/ML IJ SOLN
5.0000 mg | Freq: Four times a day (QID) | INTRAMUSCULAR | Status: DC
  Administered 2014-03-18 – 2014-03-20 (×8): 5 mg via INTRAVENOUS
  Filled 2014-03-18: qty 1
  Filled 2014-03-18: qty 2
  Filled 2014-03-18 (×10): qty 1
  Filled 2014-03-18: qty 2

## 2014-03-18 MED ORDER — SODIUM CHLORIDE 0.9 % IV SOLN
INTRAVENOUS | Status: AC
  Administered 2014-03-18 (×2): via INTRAVENOUS

## 2014-03-18 MED ORDER — POLYETHYLENE GLYCOL 3350 17 G PO PACK
17.0000 g | PACK | Freq: Every day | ORAL | Status: DC
  Administered 2014-03-18 – 2014-03-20 (×3): 17 g via ORAL
  Filled 2014-03-18 (×3): qty 1

## 2014-03-18 MED ORDER — LACTULOSE 10 GM/15ML PO SOLN
20.0000 g | Freq: Every day | ORAL | Status: DC
  Administered 2014-03-18: 20 g via ORAL
  Filled 2014-03-18: qty 30

## 2014-03-18 MED ORDER — ONDANSETRON HCL 4 MG PO TABS: 4.0000 mg | ORAL_TABLET | Freq: Four times a day (QID) | ORAL | Status: DC | PRN

## 2014-03-18 MED ORDER — ONDANSETRON HCL 4 MG/2ML IJ SOLN: 4.0000 mg | Freq: Four times a day (QID) | INTRAMUSCULAR | Status: DC | PRN
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
## 2014-03-18 NOTE — Care Management Note (Signed)
    Page 1 of 1   03/18/2014     11:18:36 AM   CARE MANAGEMENT NOTE 03/18/2014  Patient:  Dollene PrimroseFIELDS,Taysha A   Account Number:  [id number]  Date Initiated:  03/18/2014  Documentation initiated by:  Lorenda IshiharaPEELE,Zakiyyah Savannah  Subjective/Objective Assessment:   63 yo female with mental retardation admitted with distented abd, obstipation. PTA lived at home with sister.     Action/Plan:   Home when stable   Anticipated DC Date:  03/20/2014   Anticipated DC Plan:  HOME/SELF CARE      DC Planning Services  CM consult      Choice offered to / List presented to:             Status of service:  Completed, signed off Medicare Important Message given?  NA - LOS <3 / Initial given by admissions (If response is "NO", the following Medicare IM given date [place] will be blank) Date Medicare IM given:   Date Additional Medicare IM given:    Discharge Disposition:  HOME/SELF CARE  Per UR Regulation:  Reviewed for med. necessity/level of care/duration of stay  If discussed at Long Length of Stay Meetings, dates discussed:    Comments:

## 2014-03-18 NOTE — Progress Notes (Signed)
Pt has order for rectal tube insertion. 2 RNs unsuccessful at inserting rectal tube. MD notified. No orders given. Nydia BoutonAmanda King SavagevilleLemons

## 2014-03-18 NOTE — Progress Notes (Signed)
Progress Note   [name] ZOX:[id number] DOB: 1951/04/20 DOA: 03/17/2014 PCP: Paulino Rily, MD   Brief Narrative:    [name] is an 64 y.o. female with a PMH of mental retardation, chronic constipation, frequent hospitalizations for obstipation/ileus who is admitted with her usual obstipation type symptoms 03/17/14.  Assessment/Plan:    Principal Problem:   Obstipation with bloating and constipation / colonic inertia / ileus Patient did not have significant results from an enema that was given on admission. A rectal tube has been placed. Her home dose of lactulose was increased to twice a day and she was continued on daily MiraLAX. We'll give a bottle of magnesium citrate today. Okay to advance diet. No electrolyte abnormalities noted. Will add Reglan. Active Problems:   HTN (hypertension) Continue clonidine patch.   Mental retardation Supportive care.   Nausea & vomiting Anti-emetics as needed.   DVT Prophylaxis Chronically bedbound.  Code Status: Full. Family Communication: Sister at bedside. Disposition Plan: Home when stable.   IV access:  Peripheral IV  Procedures  None.  Medical Consultants:  None.  Other Consultants:  None.  Anti-infectives:  None.  Subjective:   [name] is minimally verbal but appears comfortable. Her sister tells me she is hungry and would like to eat. No current nausea or vomiting. Rectal tube in place, but no output.  Objective:    Filed Vitals:   03/17/14 2304 03/18/14 0238 03/18/14 0551 03/18/14 1400  BP: 181/91 141/72 185/104 140/85  Pulse: 99 72 71 68  Temp: 97.8 F (36.6 C) 98.1 F (36.7 C) 96.7 F (35.9 C) 98.9 F (37.2 C)  TempSrc: Oral Oral Oral Oral  Resp: 18 18 18 18   Height:  4\' 9"  (1.448 m)    Weight:  43.908 kg (96 lb 12.8 oz)    SpO2: 100% 100% 98% 99%    Intake/Output Summary (Last 24 hours) at 03/18/14 1614 Last data filed at 03/18/14 1400  Gross per 24 hour    Intake 523.75 ml  Output    750 ml  Net -226.25 ml    Exam: Gen:  NAD Cardiovascular:  RRR, No M/R/G Respiratory:  Lungs CTAB Gastrointestinal:  Abdomen soft, massively distended, + BS Extremities:  No C/E/C   Data Reviewed:    Labs: Basic Metabolic Panel:  Recent Labs Lab 03/18/14 0142 03/18/14 0145 03/18/14 0457  NA 142  --  142  K 3.9  --  4.3  CL 104  --  108  CO2 27  --  25  GLUCOSE 76  --  88  BUN 17  --  16  CREATININE 0.62  --  0.55  CALCIUM 9.8  --  8.6  MG  --  2.1  --    GFR Estimated Creatinine Clearance: 45 ml/min (by C-G formula based on Cr of 0.55).  CBC:  Recent Labs Lab 03/18/14 0142 03/18/14 0457  WBC 9.3 7.7  NEUTROABS 5.9  --   HGB 12.6 11.2*  HCT 38.9 36.0  MCV 90.5 90.7  PLT 281 238   Sepsis Labs:  Recent Labs Lab 03/18/14 0142 03/18/14 0457  WBC 9.3 7.7   Microbiology No results found for this or any previous visit (from the past 240 hour(s)).   Radiographs/Studies:    Dg Abd Acute W/chest  03/17/2014   CLINICAL DATA:  Vomiting and abdominal distention.  EXAM: ACUTE ABDOMEN SERIES (ABDOMEN 2 VIEW & CHEST 1 VIEW)  COMPARISON:  05/30/2013  FINDINGS: Lungs  are hypoinflated and otherwise clear. Cardiomediastinal silhouette and remainder of the chest is unchanged.  Examination demonstrates a moderately air distended colon unchanged from the prior exam there is no definite free air. There is mild fecal retention throughout the colon. 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  IMPRESSION: Findings compatible with chronic colonic ileus.  No acute cardiopulmonary disease.   Electronically Signed   By: Elberta Fortisaniel  Boyle M.D.   On: 03/17/2014 23:54    Medications:    . cloNIDine  0.2 mg Transdermal Weekly  . lactulose  20 g Oral BID  . polyethylene glycol  17 g Oral Daily   Continuous Infusions:   Time spent: 35 minutes  with > 50% of time discussing current diagnostic test results, clinical impression and plan of care with the patient's sister at the bedside.    LOS: 1 day   Maryruth BunChristina P Rama  [facility] Pager [phone]. If unable to reach me by pager, please call my cell phone at [phone].  *Please refer to [email or web address], password [email or web address] to get updated schedule on who will round on this patient, as hospitalists switch teams weekly. If 7PM-7AM, please contact night-coverage at [email or web address], password [email or web address] for any overnight needs.  03/18/2014, 4:14 PM    **Disclaimer: This note was dictated with voice recognition software. Similar sounding words can inadvertently be transcribed and this note may contain transcription errors which may not have been corrected upon publication of note.**
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
## 2014-03-18 NOTE — ED Notes (Signed)
No results thus far with enema. Pt ambulatory to restroom. Will reevaluate shortly.

## 2014-03-19 DIAGNOSIS — IMO0002 Reserved for concepts with insufficient information to code with codable children: Secondary | ICD-10-CM

## 2014-03-19 MED ORDER — LORAZEPAM 2 MG/ML IJ SOLN
1.0000 mg | Freq: Once | INTRAMUSCULAR | Status: AC
  Administered 2014-03-19: 1 mg via INTRAVENOUS
  Filled 2014-03-19: qty 1

## 2014-03-19 MED ORDER — LORAZEPAM 2 MG/ML IJ SOLN
INTRAMUSCULAR | Status: AC
  Filled 2014-03-19: qty 1

## 2014-03-19 MED ORDER — LORAZEPAM 2 MG/ML IJ SOLN
1.0000 mg | INTRAMUSCULAR | Status: DC | PRN
  Administered 2014-03-19 – 2014-03-20 (×2): 1 mg via INTRAVENOUS
  Filled 2014-03-19: qty 1

## 2014-03-19 NOTE — Progress Notes (Signed)
Progress Note   Cynthia PrimroseMichelle A Oshita ZOX:[id number] DOB: [birth year] DOA: 03/17/2014 PCP: Paulino RilyJONES,ENRICO G, MD   Brief Narrative:    [name] is an 63 y.o. female with a PMH of mental retardation, chronic constipation, frequent hospitalizations for obstipation/ileus who is admitted with her usual obstipation type symptoms 03/17/14.  Assessment/Plan:    Principal Problem:   Obstipation with bloating and constipation / colonic inertia / ileus Patient did not have significant results from an enema that was given on admission. A rectal tube has been placed. Her home dose of lactulose was increased to twice a day and she was continued on daily MiraLAX. Given a bottle of magnesium citrate 03/18/14. Bowels moved. Diet advanced 03/18/14. Active Problems:   Agitation Ativan PRN ordered.   HTN (hypertension) Continue clonidine patch.   Mental retardation Supportive care.   Nausea & vomiting Anti-emetics as needed.   DVT Prophylaxis Chronically bedbound.  Code Status: Full. Family Communication: Sister at bedside. Disposition Plan: Home when stable.   IV access:  Peripheral IV  Procedures  None.  Medical Consultants:  None.  Other Consultants:  None.  Anti-infectives:  None.  Subjective:   [name] is minimally verbal but appears comfortable. She has been agitated, pulling off her SCDs and attempting to remove her IV.  Appetite is good.  No nausea or vomiting noted.  Rectal tube draining tan stools.  Objective:    Filed Vitals:   03/18/14 0551 03/18/14 1400 03/18/14 2135 03/19/14 0630  BP: 185/104 140/85 140/78 140/90  Pulse: 71 68 67 72  Temp: 96.7 F (35.9 C) 98.9 F (37.2 C) 98.7 F (37.1 C) 98 F (36.7 C)  TempSrc: Oral Oral Oral Oral  Resp: 18 18 18 18   Height:      Weight:      SpO2: 98% 99% 98% 98%    Intake/Output Summary (Last 24 hours) at 03/19/14 0823 Last data filed at 03/19/14 0645  Gross per 24 hour  Intake   1020 ml    Output   1851 ml  Net   -831 ml    Exam: Gen:  NAD Cardiovascular:  RRR, No M/R/G Respiratory:  Lungs CTAB Gastrointestinal:  Abdomen soft, less distended, + BS Extremities:  No C/E/C   Data Reviewed:    Labs: Basic Metabolic Panel:  Recent Labs Lab 03/18/14 0142 03/18/14 0145 03/18/14 0457  NA 142  --  142  K 3.9  --  4.3  CL 104  --  108  CO2 27  --  25  GLUCOSE 76  --  88  BUN 17  --  16  CREATININE 0.62  --  0.55  CALCIUM 9.8  --  8.6  MG  --  2.1  --    GFR Estimated Creatinine Clearance: 45 ml/min (by C-G formula based on Cr of 0.55).  CBC:  Recent Labs Lab 03/18/14 0142 03/18/14 0457  WBC 9.3 7.7  NEUTROABS 5.9  --   HGB 12.6 11.2*  HCT 38.9 36.0  MCV 90.5 90.7  PLT 281 238   Sepsis Labs:  Recent Labs Lab 03/18/14 0142 03/18/14 0457  WBC 9.3 7.7   Microbiology No results found for this or any previous visit (from the past 240 hour(s)).   Radiographs/Studies:    Dg Abd Acute W/chest  03/17/2014   CLINICAL DATA:  Vomiting and abdominal distention.  EXAM: ACUTE ABDOMEN SERIES (ABDOMEN 2 VIEW & CHEST 1 VIEW)  COMPARISON:  05/30/2013  FINDINGS: Lungs  are hypoinflated and otherwise clear. Cardiomediastinal silhouette and remainder of the chest is unchanged.  Examination demonstrates a moderately air distended colon unchanged from the prior exam there is no definite free air. There is mild fecal retention throughout the colon. 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  IMPRESSION: Findings compatible with chronic colonic ileus.  No acute cardiopulmonary disease.   Electronically Signed   By: Elberta Fortis M.D.   On: 03/17/2014 23:54    Medications:    . cloNIDine  0.2 mg Transdermal Weekly  . feeding supplement (ENSURE COMPLETE)  237 mL Oral BID BM  . lactulose  20 g Oral BID  . metoCLOPramide (REGLAN) injection  5 mg Intravenous 4  times per day  . polyethylene glycol  17 g Oral Daily   Continuous Infusions:   Time spent: 25 minutes.    LOS: 2 days   Maryruth Bun Lenvil Swaim  [facility] Pager [phone]. If unable to reach me by pager, please call my cell phone at [phone].  *Please refer to [email or web address], password [email or web address] to get updated schedule on who will round on this patient, as hospitalists switch teams weekly. If 7PM-7AM, please contact night-coverage at [email or web address], password [email or web address] for any overnight needs.  03/19/2014, 8:23 AM    **Disclaimer: This note was dictated with voice recognition software. Similar sounding words can inadvertently be transcribed and this note may contain transcription errors which may not have been corrected upon publication of note.**

## 2014-03-20 MED ORDER — HYDROCODONE-ACETAMINOPHEN 5-325 MG PO TABS: 1.0000 | ORAL_TABLET | Freq: Three times a day (TID) | ORAL | Status: AC | PRN

## 2014-03-20 MED ORDER — LORAZEPAM 0.5 MG PO TABS: 0.5000 mg | ORAL_TABLET | Freq: Two times a day (BID) | ORAL | Status: DC | PRN

## 2014-03-20 NOTE — Discharge Instructions (Signed)
Bloating °Bloating is the feeling of fullness in your belly. You may feel as though your pants are too tight. Often the cause of bloating is overeating, retaining fluids, or having gas in your bowel. It is also caused by swallowing air and eating foods that cause gas. Irritable bowel syndrome is one of the most common causes of bloating. Constipation is also a common cause. Sometimes more serious problems can cause bloating. °SYMPTOMS  °Usually there is a feeling of fullness, as though your abdomen is bulged out. There may be mild discomfort.  °DIAGNOSIS  °Usually no particular testing is necessary for most bloating. If the condition persists and seems to become worse, your caregiver may do additional testing.  °TREATMENT  °· There is no direct treatment for bloating. °· Do not put gas into the bowel. Avoid chewing gum and sucking on candy. These tend to make you swallow air. Swallowing air can also be a nervous habit. Try to avoid this. °· Avoiding high residue diets will help. Eat foods with soluble fibers (examples include root vegetables, apples, or barley) and substitute dairy products with soy and rice products. This helps irritable bowel syndrome. °· If constipation is the cause, then a high residue diet with more fiber will help. °· Avoid carbonated beverages. °· Over-the-counter preparations are available that help reduce gas. Your pharmacist can help you with this. °SEEK MEDICAL CARE IF:  °· Bloating continues and seems to be getting worse. °· You notice a weight gain. °· You have a weight loss but the bloating is getting worse. °· You have changes in your bowel habits or develop nausea or vomiting. °SEEK IMMEDIATE MEDICAL CARE IF:  °· You develop shortness of breath or swelling in your legs. °· You have an increase in abdominal pain or develop chest pain. °Document Released: 09/26/2006 Document Revised: 02/18/2012 Document Reviewed: 11/14/2007 °[employer]® Patient Information ©2014 [employer]. ° °Constipation, Adult °Constipation is when a person has fewer than 3 bowel movements a week; has difficulty having a bowel movement; or has stools that are dry, hard, or larger than normal. As people grow older, constipation is more common. If you try to fix constipation with medicines that make you have a bowel movement (laxatives), the problem may get worse. Long-term laxative use may cause the muscles of the colon to become weak. A low-fiber diet, not taking in enough fluids, and taking certain medicines may make constipation worse. °CAUSES  °· Certain medicines, such as antidepressants, pain medicine, iron supplements, antacids, and water pills.   °· Certain diseases, such as diabetes, irritable bowel syndrome (IBS), thyroid disease, or depression.   °· Not drinking enough water.   °· Not eating enough fiber-rich foods.   °· Stress or travel. °· Lack of physical activity or exercise. °· Not going to the restroom when there is the urge to have a bowel movement. °· Ignoring the urge to have a bowel movement. °· Using laxatives too much. °SYMPTOMS  °· Having fewer than 3 bowel movements a week.   °· Straining to have a bowel movement.   °· Having hard, dry, or larger than normal stools.   °· Feeling full or bloated.   °· Pain in the lower abdomen. °· Not feeling relief after having a bowel movement. °DIAGNOSIS  °Your caregiver will take a medical history and perform a physical exam. Further testing may be done for severe constipation. Some tests may include:  °· A barium enema X-ray to examine your rectum, colon, and sometimes, your small intestine. °· A sigmoidoscopy to examine   your lower colon. °· A colonoscopy to examine your entire colon. °TREATMENT  °Treatment will depend on the severity of your constipation and what is causing it. Some dietary treatments include drinking more fluids and eating more fiber-rich foods. Lifestyle treatments may include regular exercise. If these diet and lifestyle  recommendations do not help, your caregiver may recommend taking over-the-counter laxative medicines to help you have bowel movements. Prescription medicines may be prescribed if over-the-counter medicines do not work.  °HOME CARE INSTRUCTIONS  °· Increase dietary fiber in your diet, such as fruits, vegetables, whole grains, and beans. Limit high-fat and processed sugars in your diet, such as French fries, hamburgers, cookies, candies, and soda.   °· A fiber supplement may be added to your diet if you cannot get enough fiber from foods.   °· Drink enough fluids to keep your urine clear or pale yellow.   °· Exercise regularly or as directed by your caregiver.   °· Go to the restroom when you have the urge to go. Do not hold it. °· Only take medicines as directed by your caregiver. Do not take other medicines for constipation without talking to your caregiver first. °SEEK IMMEDIATE MEDICAL CARE IF:  °· You have bright red blood in your stool.   °· Your constipation lasts for more than 4 days or gets worse.   °· You have abdominal or rectal pain.   °· You have thin, pencil-like stools. °· You have unexplained weight loss. °MAKE SURE YOU:  °· Understand these instructions. °· Will watch your condition. °· Will get help right away if you are not doing well or get worse. °Document Released: 08/24/2004 Document Revised: 02/18/2012 Document Reviewed: 09/07/2013 °Exit[employer]® Patient Information ©2014 [employer]. ° °

## 2014-03-20 NOTE — Discharge Summary (Addendum)
Physician Discharge Summary  [name] ZOX:[id number] DOB: [birth year] DOA: 03/17/2014  PCP: Paulino Rily, MD  Admit date: 03/17/2014 Discharge date: 03/20/2014   Recommendations for Outpatient Follow-Up:   1. Close followup of blood pressure recommended.   Discharge Diagnosis:   Principal Problem:    Obstipation Active Problems:    HTN (hypertension)    Ileus    Constipation, chronic    Agitation    Colonic inertia    Mental retardation    Nausea & vomiting   Discharge Condition: Improved.  Diet recommendation: Low sodium, heart healthy.     History of Present Illness:   [name] is an 63 y.o. female with a PMH of mental retardation, chronic constipation, frequent hospitalizations for obstipation/ileus who is admitted with her usual obstipation type symptoms 03/17/14.  Hospital Course by Problem:   Principal Problem:  Obstipation with bloating and constipation / colonic inertia / ileus  Patient did not have significant results from an enema that was given on admission. A rectal tube was subsequently placed. Her home dose of lactulose was increased to twice a day and she was continued on daily MiraLAX. Given a bottle of magnesium citrate 03/18/14 with excellent results. Diet advanced 03/18/14. The rectal tube was discontinued 03/20/14 and a soapsuds enema was given with a large amount of stool output. Her abdominal distention has resolved. Active Problems:  Agitation  Ativan PRN given.  HTN (hypertension)  Continue clonidine patch.  Mental retardation  Supportive care.  Nausea & vomiting  Resolved after conservative treatment with anti-emetics.  Procedures:    Insertion of rectal tube   Medical Consultants:    None   Discharge Exam:   Filed Vitals:   03/20/14 1351  BP: 170/96  Pulse: 109  Temp: 98 F (36.7 C)  Resp: 16   Filed Vitals:   03/19/14 2017 03/20/14 0616 03/20/14 0623 03/20/14 1351  BP: 162/91 174/116 150/90 170/96   Pulse: 94 82  109  Temp: 98.3 F (36.8 C) 97.4 F (36.3 C)  98 F (36.7 C)  TempSrc: Oral Oral  Oral  Resp: 16 16  16   Height:      Weight:      SpO2: 98% 99%  100%    Gen:  NAD Cardiovascular:  RRR, No M/R/G Respiratory: Lungs CTAB Gastrointestinal: Abdomen soft, NT/ND with normal active bowel sounds. Extremities: No C/E/C    Discharge Instructions:    Discharge Orders   Future Orders Complete By Expires   Call MD for:  As directed    Scheduling Instructions:   Worsening swelling of the stomach, no BM in 2 days, vomiting.   Diet - low sodium heart healthy  As directed    Discharge instructions  As directed    Increase activity slowly  As directed        Medication List         cloNIDine 0.2 mg/24hr patch  Commonly known as:  CATAPRES - Dosed in mg/24 hr  Place 0.2 mg onto the skin once a week.     HYDROcodone-acetaminophen 5-325 MG per tablet  Commonly known as:  NORCO/VICODIN  Take 1 tablet by mouth every 8 (eight) hours as needed for pain.     lactulose 10 GM/15ML solution  Commonly known as:  CHRONULAC  Take 30 mLs (20 g total) by mouth daily.     LORazepam 0.5 MG tablet  Commonly known as:  ATIVAN  Take 0.5 mg by mouth 2 (two) times daily  as needed for anxiety (agitation.).     multivitamins ther. w/minerals Tabs tablet  Take 1 tablet by mouth daily.     polyethylene glycol packet  Commonly known as:  MIRALAX / GLYCOLAX  Take 17 g by mouth daily.          The results of significant diagnostics from this hospitalization (including imaging, microbiology, ancillary and laboratory) are listed below for reference.     Significant Diagnostic Studies:   Radiographs: Dg Abd Acute W/chest  03/17/2014   CLINICAL DATA:  Vomiting and abdominal distention.  EXAM: ACUTE ABDOMEN SERIES (ABDOMEN 2 VIEW & CHEST 1 VIEW)  COMPARISON:  05/30/2013  FINDINGS: Lungs are hypoinflated and otherwise clear. Cardiomediastinal silhouette and remainder of the chest is  unchanged.  Examination demonstrates a moderately air distended colon unchanged from the prior exam there is no definite free air. There is mild fecal retention throughout the colon. 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  IMPRESSION: Findings compatible with chronic colonic ileus.  No acute cardiopulmonary disease.   Electronically Signed   By: Elberta Fortisaniel  Boyle M.D.   On: 03/17/2014 23:54    Labs:  Basic Metabolic Panel:  Recent Labs Lab 03/18/14 0142 03/18/14 0145 03/18/14 0457  NA 142  --  142  K 3.9  --  4.3  CL 104  --  108  CO2 27  --  25  GLUCOSE 76  --  88  BUN 17  --  16  CREATININE 0.62  --  0.55  CALCIUM 9.8  --  8.6  MG  --  2.1  --    GFR Estimated Creatinine Clearance: 45 ml/min (by C-G formula based on Cr of 0.55).  CBC:  Recent Labs Lab 03/18/14 0142 03/18/14 0457  WBC 9.3 7.7  NEUTROABS 5.9  --   HGB 12.6 11.2*  HCT 38.9 36.0  MCV 90.5 90.7  PLT 281 238    Time coordinating discharge: 25 minutes.  SignedMaryruth Bun:  [name]  Pager [phone] [id number] [facility] 03/20/2014, 2:24 PM

## 2024-11-09 DEATH — deceased
